# Patient Record
Sex: Female | Born: 1975
Health system: Southern US, Community
[De-identification: ages and names within clinical notes are randomized; demographics above are authoritative.]

## PROBLEM LIST (undated history)

## (undated) DIAGNOSIS — A630 Anogenital (venereal) warts: Secondary | ICD-10-CM

## (undated) DIAGNOSIS — L409 Psoriasis, unspecified: Secondary | ICD-10-CM

## (undated) DIAGNOSIS — K219 Gastro-esophageal reflux disease without esophagitis: Secondary | ICD-10-CM

## (undated) HISTORY — PX: TONSILLECTOMY: SUR1361

## (undated) HISTORY — DX: Gastro-esophageal reflux disease without esophagitis: K21.9

## (undated) HISTORY — PX: NO PAST SURGERIES: SHX2092

## (undated) HISTORY — PX: WISDOM TOOTH EXTRACTION: SHX21

## (undated) HISTORY — DX: Anogenital (venereal) warts: A63.0

---

## 2017-07-14 DIAGNOSIS — J060 Acute laryngopharyngitis: Secondary | ICD-10-CM | POA: Diagnosis not present

## 2017-07-14 DIAGNOSIS — J069 Acute upper respiratory infection, unspecified: Secondary | ICD-10-CM | POA: Diagnosis not present

## 2017-07-14 DIAGNOSIS — J0141 Acute recurrent pansinusitis: Secondary | ICD-10-CM | POA: Diagnosis not present

## 2017-07-26 DIAGNOSIS — R0989 Other specified symptoms and signs involving the circulatory and respiratory systems: Secondary | ICD-10-CM | POA: Diagnosis not present

## 2017-07-26 DIAGNOSIS — F458 Other somatoform disorders: Secondary | ICD-10-CM | POA: Diagnosis not present

## 2017-07-26 DIAGNOSIS — R079 Chest pain, unspecified: Secondary | ICD-10-CM | POA: Diagnosis not present

## 2017-07-26 DIAGNOSIS — B3781 Candidal esophagitis: Secondary | ICD-10-CM | POA: Diagnosis not present

## 2017-07-26 DIAGNOSIS — B370 Candidal stomatitis: Secondary | ICD-10-CM | POA: Diagnosis not present

## 2017-08-12 ENCOUNTER — Emergency Department (HOSPITAL_BASED_OUTPATIENT_CLINIC_OR_DEPARTMENT_OTHER)
Admission: EM | Admit: 2017-08-12 | Discharge: 2017-08-13 | Disposition: A | Discharge status: Home / Self Care | Payer: BLUE CROSS/BLUE SHIELD | Attending: Emergency Medicine | Admitting: Emergency Medicine

## 2017-08-12 ENCOUNTER — Encounter (HOSPITAL_BASED_OUTPATIENT_CLINIC_OR_DEPARTMENT_OTHER): Payer: Self-pay | Admitting: Emergency Medicine

## 2017-08-12 ENCOUNTER — Other Ambulatory Visit: Payer: Self-pay

## 2017-08-12 ENCOUNTER — Emergency Department (HOSPITAL_BASED_OUTPATIENT_CLINIC_OR_DEPARTMENT_OTHER): Payer: BLUE CROSS/BLUE SHIELD

## 2017-08-12 DIAGNOSIS — J069 Acute upper respiratory infection, unspecified: Secondary | ICD-10-CM | POA: Diagnosis not present

## 2017-08-12 DIAGNOSIS — R079 Chest pain, unspecified: Secondary | ICD-10-CM | POA: Diagnosis not present

## 2017-08-12 DIAGNOSIS — R509 Fever, unspecified: Secondary | ICD-10-CM | POA: Diagnosis not present

## 2017-08-12 DIAGNOSIS — J209 Acute bronchitis, unspecified: Secondary | ICD-10-CM | POA: Diagnosis not present

## 2017-08-12 DIAGNOSIS — R000 Tachycardia, unspecified: Secondary | ICD-10-CM | POA: Diagnosis not present

## 2017-08-12 LAB — CBC
HEMATOCRIT: 40.7 % (ref 36.0–46.0)
HEMOGLOBIN: 13.3 g/dL (ref 12.0–15.0)
MCH: 31.8 pg (ref 26.0–34.0)
MCHC: 32.7 g/dL (ref 30.0–36.0)
MCV: 97.4 fL (ref 78.0–100.0)
Platelets: 208 10*3/uL (ref 150–400)
RBC: 4.18 MIL/uL (ref 3.87–5.11)
RDW: 13.1 % (ref 11.5–15.5)
WBC: 6 10*3/uL (ref 4.0–10.5)

## 2017-08-12 LAB — BASIC METABOLIC PANEL
ANION GAP: 10 (ref 5–15)
BUN: 5 mg/dL — ABNORMAL LOW (ref 6–20)
CHLORIDE: 105 mmol/L (ref 101–111)
CO2: 23 mmol/L (ref 22–32)
Calcium: 9.2 mg/dL (ref 8.9–10.3)
Creatinine, Ser: 0.77 mg/dL (ref 0.44–1.00)
GFR calc Af Amer: >60 mL/min (ref >60)
Glucose, Bld: 117 mg/dL — ABNORMAL HIGH (ref 65–99)
POTASSIUM: 3.8 mmol/L (ref 3.5–5.1)
SODIUM: 138 mmol/L (ref 135–145)

## 2017-08-12 LAB — PREGNANCY, URINE: PREG TEST UR: NEGATIVE

## 2017-08-12 LAB — D-DIMER, QUANTITATIVE (NOT AT ARMC): D DIMER QUANT: 0.4 ug{FEU}/mL (ref 0.00–0.50)

## 2017-08-12 LAB — TROPONIN I: Troponin I: <0.03 ng/mL (ref <0.03)

## 2017-08-12 MED ORDER — CETIRIZINE HCL 10 MG PO TABS: 10.0000 mg | ORAL_TABLET | Freq: Every day | ORAL | 0 refills | Status: DC

## 2017-08-12 MED ORDER — SODIUM CHLORIDE 0.9 % IV BOLUS (SEPSIS)
500.0000 mL | Freq: Once | INTRAVENOUS | Status: AC
Start: 2017-08-12 — End: 2017-08-12
  Administered 2017-08-12: 500 mL via INTRAVENOUS

## 2017-08-12 NOTE — ED Triage Notes (Signed)
Patient states that she has had pain to her chest x 1 month. She reports that she has had several therapies for Headaches, cough, sinus infections related to the Chest pain. The patient reports that she went to the Urgent care as was told that her heart rate was so fast that it was about to stop

## 2017-08-12 NOTE — ED Provider Notes (Signed)
[facility] EMERGENCY DEPARTMENT Provider Note   CSN: [id number] Arrival date & time: 08/12/17  1719     History   Chief Complaint Chief Complaint  Patient presents with  . Chest Pain    HPI April Ho is a 42 y.o. female.  The history is provided by the patient. No language interpreter was used.  Chest Pain      April Ho is a 42 y.o. female who presents to the Emergency Department complaining of chest pain.  She reports having 1 month of respiratory symptoms with nasal congestion, sore throat, cough productive of yellow sputum, chest pain, shortness of breath.  The pain in her chest is constant but worse with coughing.  She was told she had a fever today to 101.  No nausea, vomiting, leg swelling or pain.  She has been treated with 2 courses of antibiotics as well as a course of steroids for the symptoms but they continue to worsen.  She has no known medical problems and takes no current medications.  She went to urgent care for the symptoms and they sent her to the emergency department because she was told her heart rate was so high that she would go into cardiac arrest. History reviewed. No pertinent past medical history.  There are no active problems to display for this patient.   History reviewed. No pertinent surgical history.  OB History    No data available       Home Medications    Prior to Admission medications   Medication Sig Start Date End Date Taking? Authorizing Provider  cetirizine (ZYRTEC ALLERGY) 10 MG tablet Take 1 tablet (10 mg total) by mouth daily. 08/12/17   Quintella Reichert, MD    Family History History reviewed. No pertinent family history.  Social History Social History   Tobacco Use  . Smoking status: Never Smoker  . Smokeless tobacco: Never Used  Substance Use Topics  . Alcohol use: No    Frequency: Never  . Drug use: No     Allergies   Patient has no known allergies.   Review of Systems Review of Systems    Cardiovascular: Positive for chest pain.  All other systems reviewed and are negative.    Physical Exam Updated Vital Signs BP 120/84   Pulse 93   Temp 99.8 F (37.7 C) (Oral)   Resp 14   Ht 5\' 7"  (1.702 m)   Wt 85.3 kg (188 lb)   SpO2 99%   BMI 29.44 kg/m   Physical Exam  Constitutional: She is oriented to person, place, and time. She appears well-developed and well-nourished.  HENT:  Head: Normocephalic and atraumatic.  Right Ear: External ear normal.  Left Ear: External ear normal.  Mouth/Throat: Oropharynx is clear and moist. No oropharyngeal exudate.  Eyes: EOM are normal. Pupils are equal, round, and reactive to light.  Neck: Neck supple.  Cardiovascular: Regular rhythm.  No murmur heard. tachycardic  Pulmonary/Chest: Effort normal and breath sounds normal. No respiratory distress.  Abdominal: Soft. There is no tenderness. There is no rebound and no guarding.  Musculoskeletal: She exhibits no edema or tenderness.  Neurological: She is alert and oriented to person, place, and time.  Skin: Skin is warm and dry.  Psychiatric: She has a normal mood and affect. Her behavior is normal.  Nursing note and vitals reviewed.    ED Treatments / Results  Labs (all labs ordered are listed, but only abnormal results are displayed) Labs Reviewed  BASIC  METABOLIC PANEL - Abnormal; Notable for the following components:      Result Value   Glucose, Bld 117 (*)    BUN 5 (*)    All other components within normal limits  CBC  TROPONIN I  D-DIMER, QUANTITATIVE (NOT AT [facility])  PREGNANCY, URINE    EKG  EKG Interpretation  Date/Time:  Saturday August 12 2017 17:28:23 EST Ventricular Rate:  112 PR Interval:  144 QRS Duration: 70 QT Interval:  306 QTC Calculation: 417 R Axis:   88 Text Interpretation:  Sinus tachycardia Nonspecific ST abnormality Abnormal ECG Confirmed by Quintella Reichert [phone]) on 08/12/2017 5:37:18 PM       Radiology Dg Chest 2 View  Result Date:  08/12/2017 CLINICAL DATA:  URI for 1 month, nasal and chest congestion, central chest pain, fever, nausea and vomiting today, lost Colace EXAM: CHEST  2 VIEW COMPARISON:  None FINDINGS: Normal heart size, mediastinal contours, and pulmonary vascularity. Minimal peribronchial thickening. No pulmonary infiltrate, pleural effusion, or pneumothorax. Bones unremarkable. IMPRESSION: Minimal bronchitic changes without infiltrate. Electronically Signed   By: Lavonia Dana M.D.   On: 08/12/2017 18:38    Procedures Procedures (including critical care time)  Medications Ordered in ED Medications  sodium chloride 0.9 % bolus 500 mL (0 mLs Intravenous Stopped 08/12/17 2344)     Initial Impression / Assessment and Plan / ED Course  I have reviewed the triage vital signs and the nursing notes.  Pertinent labs & imaging results that were available during my care of the patient were reviewed by me and considered in my medical decision making (see chart for details).     Patient here for evaluation of chest discomfort, cough that is been present for the last month.  She is nontoxic appearing on examination with no respiratory distress.  Lungs are clear bilaterally.  She has no findings concerning for ACS, PE, dissection, pna, CHF.   Discussed with patient home care for bronchitis.  She is already received medications from urgent care earlier today that she has not started.  She was provided with Tessalon Perles, Afrin.  Will add Zyrtec prescription.  Counseled patient on home care, outpatient follow-up return precautions.  Final Clinical Impressions(s) / ED Diagnoses   Final diagnoses:  Acute bronchitis, unspecified organism    ED Discharge Orders        Ordered    cetirizine (ZYRTEC ALLERGY) 10 MG tablet  Daily     08/12/17 2330       Quintella Reichert, MD 08/13/17 [phone]

## 2017-08-14 ENCOUNTER — Ambulatory Visit: Payer: BLUE CROSS/BLUE SHIELD | Admitting: Family Medicine

## 2017-08-14 ENCOUNTER — Encounter: Payer: Self-pay | Admitting: Family Medicine

## 2017-08-14 VITALS — BP 108/64 | HR 101 | Temp 98.5°F | Ht 67.0 in | Wt 194.4 lb

## 2017-08-14 DIAGNOSIS — B9689 Other specified bacterial agents as the cause of diseases classified elsewhere: Secondary | ICD-10-CM | POA: Diagnosis not present

## 2017-08-14 DIAGNOSIS — J208 Acute bronchitis due to other specified organisms: Secondary | ICD-10-CM | POA: Diagnosis not present

## 2017-08-14 MED ORDER — FLUTICASONE PROPIONATE 50 MCG/ACT NA SUSP: 2.0000 | Freq: Every day | NASAL | 2 refills | Status: DC

## 2017-08-14 MED ORDER — AZITHROMYCIN 250 MG PO TABS: ORAL_TABLET | ORAL | 0 refills | Status: DC

## 2017-08-14 MED ORDER — CETIRIZINE HCL 10 MG PO TABS: 10.0000 mg | ORAL_TABLET | Freq: Every day | ORAL | 0 refills | Status: DC

## 2017-08-14 MED ORDER — BENZONATATE 200 MG PO CAPS: 200.0000 mg | ORAL_CAPSULE | Freq: Three times a day (TID) | ORAL | 0 refills | Status: DC | PRN

## 2017-08-14 NOTE — Patient Instructions (Addendum)
If you get better with the cetirizine over the next 2-3 days, do not take the azithromycin.   Claritin (loratadine), Allegra (fexofenadine), Zyrtec (cetirizine); these are listed in order from weakest to strongest. Generic, and therefore cheaper, options are in the parentheses.   Flonase (fluticasone); nasal spray that is over the counter. 2 sprays each nostril, once daily. Aim towards the same side eye when you spray.  There are available OTC, and the generic versions, which may be cheaper, are in parentheses. Show this to a pharmacist if you have trouble finding any of these items.  Continue to push fluids, practice good hand hygiene, and cover your mouth if you cough.  If you start having fevers, shaking or shortness of breath, seek immediate care.  Let us know if you need anything.

## 2017-08-14 NOTE — Progress Notes (Signed)
Pre visit review using our clinic review tool, if applicable. No additional management support is needed unless otherwise documented below in the visit note. 

## 2017-08-14 NOTE — Progress Notes (Signed)
Chief Complaint  Patient presents with  . Establish Care       New Patient Visit SUBJECTIVE: HPI: April Ho is an 42 y.o.female who is being seen for establishing care.  Duration: 1 month  Associated symptoms: sinus congestion, rhinorrhea, ear pain, sore throat and fevers (100.51F max), cough Denies: sinus pain, itchy watery eyes, ear drainage, shortness of breath, myalgia and rigors Treatment to date: Augmentin, tessalon perles, Hycodan, Prednisone- cough medicine helped Sick contacts: No   No Known Allergies  Past Medical History:  Diagnosis Date  . No known health problems    Past Surgical History:  Procedure Laterality Date  . NO PAST SURGERIES     Social History   Socioeconomic History  . Marital status: Single  Tobacco Use  . Smoking status: Never Smoker  . Smokeless tobacco: Never Used  Substance and Sexual Activity  . Alcohol use: No    Frequency: Never  . Drug use: No   Family History  Problem Relation Age of Onset  . Cancer Neg Hx      Current Outpatient Medications:  .  benzonatate (TESSALON) 200 MG capsule, Take 1 capsule (200 mg total) by mouth 3 (three) times daily as needed for cough., Disp: 30 capsule, Rfl: 0 .  cetirizine (ZYRTEC ALLERGY) 10 MG tablet, Take 1 tablet (10 mg total) by mouth daily., Disp: 30 tablet, Rfl: 0 .  oxymetazoline (ANEFRIN NASAL SPRAY) 0.05 % nasal spray, Place 1 spray into both nostrils 2 (two) times daily., Disp: , Rfl:  .  azithromycin (ZITHROMAX) 250 MG tablet, Take 2 tabs the first day and then 1 tab daily until you run out., Disp: 6 tablet, Rfl: 0 .  fluticasone (FLONASE) 50 MCG/ACT nasal spray, Place 2 sprays into both nostrils daily., Disp: 16 g, Rfl: 2  No LMP recorded. Patient has had an injection.  ROS Cardiovascular: Denies chest pain  Respiratory: Denies dyspnea   OBJECTIVE: BP 108/64 (BP Location: Left Arm, Patient Position: Sitting, Cuff Size: Large)   Pulse (!) 101   Temp 98.5 F (36.9 C) (Oral)    Ht 5\' 7"  (1.702 m)   Wt 194 lb 6 oz (88.2 kg)   SpO2 97%   BMI 30.44 kg/m   Constitutional: -  VS reviewed -  Well developed, well nourished, appears stated age -  No apparent distress  Psychiatric: -  Oriented to person, place, and time -  Memory intact -  Affect and mood normal -  Fluent conversation, good eye contact -  Judgment and insight age appropriate  Eye: -  Conjunctivae clear, no discharge -  Pupils symmetric, round, reactive to light  ENMT: -  MMM    Pharynx moist, no exudate, no erythema -  Turbinates swollen, pale, boggy b/l -  Ears neg b/l  Neck: -  No gross swelling, no palpable masses -  Thyroid midline, not enlarged, mobile, no palpable masses  Cardiovascular: -  RRR -  No LE edema  Respiratory: -  Normal respiratory effort, no accessory muscle use, no retraction -  Breath sounds equal, no wheezes, no ronchi, no crackles  Skin: -  No significant lesion on inspection -  Warm and dry to palpation   ASSESSMENT/PLAN: Acute bacterial bronchitis - Plan: oxymetazoline (ANEFRIN NASAL SPRAY) 0.05 % nasal spray, azithromycin (ZITHROMAX) 250 MG tablet, fluticasone (FLONASE) 50 MCG/ACT nasal spray, benzonatate (TESSALON) 200 MG capsule, cetirizine (ZYRTEC ALLERGY) 10 MG tablet  Refill cough med, zyrtec; pt's last dose of Afrin today. Zpak, wait  a few days to see if she turns the corner with Zyrtec. Patient should return 1 in week if no better. The patient voiced understanding and agreement to the plan.   [place], DO 08/14/17  9:34 AM

## 2017-08-24 ENCOUNTER — Other Ambulatory Visit: Payer: Self-pay | Admitting: Family Medicine

## 2017-08-24 DIAGNOSIS — B9689 Other specified bacterial agents as the cause of diseases classified elsewhere: Secondary | ICD-10-CM

## 2017-08-24 DIAGNOSIS — J208 Acute bronchitis due to other specified organisms: Principal | ICD-10-CM

## 2017-08-24 NOTE — Telephone Encounter (Signed)
If she is not better, she needs to be seen. TY.

## 2017-08-24 NOTE — Telephone Encounter (Signed)
Patient informed of PCP instructions. She has appt. Scheduled tomorrow 08/25/17 with Mackie Pai.

## 2017-08-25 ENCOUNTER — Encounter: Payer: Self-pay | Admitting: Medical

## 2017-08-25 ENCOUNTER — Ambulatory Visit (INDEPENDENT_AMBULATORY_CARE_PROVIDER_SITE_OTHER): Payer: BLUE CROSS/BLUE SHIELD | Admitting: Medical

## 2017-08-25 ENCOUNTER — Telehealth: Payer: Self-pay | Admitting: Family Medicine

## 2017-08-25 VITALS — BP 121/81 | HR 77 | Temp 98.1°F | Resp 16 | Ht 67.0 in | Wt 193.4 lb

## 2017-08-25 DIAGNOSIS — T7840XA Allergy, unspecified, initial encounter: Secondary | ICD-10-CM

## 2017-08-25 DIAGNOSIS — D179 Benign lipomatous neoplasm, unspecified: Secondary | ICD-10-CM | POA: Diagnosis not present

## 2017-08-25 DIAGNOSIS — R109 Unspecified abdominal pain: Secondary | ICD-10-CM

## 2017-08-25 DIAGNOSIS — H938X1 Other specified disorders of right ear: Secondary | ICD-10-CM | POA: Diagnosis not present

## 2017-08-25 DIAGNOSIS — R21 Rash and other nonspecific skin eruption: Secondary | ICD-10-CM

## 2017-08-25 DIAGNOSIS — N912 Amenorrhea, unspecified: Secondary | ICD-10-CM | POA: Diagnosis not present

## 2017-08-25 DIAGNOSIS — R232 Flushing: Secondary | ICD-10-CM | POA: Diagnosis not present

## 2017-08-25 LAB — POC URINALSYSI DIPSTICK (AUTOMATED)
Bilirubin, UA: NEGATIVE
Blood, UA: NEGATIVE
GLUCOSE UA: NEGATIVE
Ketones, UA: NEGATIVE
LEUKOCYTES UA: NEGATIVE
NITRITE UA: NEGATIVE
PROTEIN UA: NEGATIVE
Spec Grav, UA: >=1.03 — AB (ref 1.010–1.025)
UROBILINOGEN UA: NEGATIVE U/dL — AB
pH, UA: 6 (ref 5.0–8.0)

## 2017-08-25 LAB — FOLLICLE STIMULATING HORMONE: FSH: 106.2 m[IU]/mL

## 2017-08-25 NOTE — Telephone Encounter (Signed)
Called in upon leaving the office wondering when they would do the ultrasound of her side.   I let her know she would be contacted to set up a time and date for it to be done.   If she has not heard back to please call us back on Wednesday and we will follow up. She verbalized understanding.

## 2017-08-25 NOTE — Progress Notes (Signed)
Subjective:    Patient ID: April Ho, female    DOB: [birth year], 42 y.o.   MRN: [id number]  HPI  Pt in with rash on both sides of forehead and rt side of neck and upper back for 2 weeks. The rash itches. Pt tried hydrocortisone and it did not help at all. The itching has subsided. Early on it did itch. On review no suspicious exposures. Particular no new soaps creams or detergents.   Pt seen by Dr. Nani Ravens other day. He diagnosed bronchitis. She took azithromycin. Overall she feels a lot better but her rt ear still has faint pressure. She his using flonase but still has the pressure. She still feels nasal congestion.   Pt also has some mild left flank pain. She states had some on last visit. She states pain comes and goes. She states pain will last for a couple of seconds and then subsides. She denies any pain in her lumbar spine or directly in her cva area. Pain transient on and off for 1.5 years.  LMP- she no longer has cycles. Pt states some hot flashes and sweats. Pt states 1.5 years with no cycle.    Review of Systems  Constitutional: Negative for chills, fatigue and fever.  HENT: Positive for congestion and postnasal drip. Negative for hearing loss, sinus pressure, sinus pain, sore throat, trouble swallowing and voice change.        Ear pressure.  Respiratory: Positive for cough. Negative for chest tightness, shortness of breath and wheezing.        Occasional cough still.  Cardiovascular: Negative for chest pain and palpitations.  Gastrointestinal: Negative for abdominal pain, nausea and vomiting.  Musculoskeletal: Negative for back pain, myalgias, neck pain and neck stiffness.  Skin: Positive for rash.  Neurological: Negative for dizziness, weakness, numbness and headaches.  Hematological: Negative for adenopathy. Does not bruise/bleed easily.  Psychiatric/Behavioral: Negative for behavioral problems and confusion. The patient is not nervous/anxious.     Past Medical  History:  Diagnosis Date  . No known health problems      Social History   Socioeconomic History  . Marital status: Single    Spouse name: Not on file  . Number of children: Not on file  . Years of education: Not on file  . Highest education level: Not on file  Social Needs  . Financial resource strain: Not on file  . Food insecurity - worry: Not on file  . Food insecurity - inability: Not on file  . Transportation needs - medical: Not on file  . Transportation needs - non-medical: Not on file  Occupational History  . Not on file  Tobacco Use  . Smoking status: Never Smoker  . Smokeless tobacco: Never Used  Substance and Sexual Activity  . Alcohol use: No    Frequency: Never  . Drug use: No  . Sexual activity: Not on file  Other Topics Concern  . Not on file  Social History Narrative  . Not on file    Past Surgical History:  Procedure Laterality Date  . NO PAST SURGERIES      Family History  Problem Relation Age of Onset  . Cancer Neg Hx     No Known Allergies  Current Outpatient Medications on File Prior to Visit  Medication Sig Dispense Refill  . benzonatate (TESSALON) 200 MG capsule Take 1 capsule (200 mg total) by mouth 3 (three) times daily as needed for cough. 30 capsule 0  . cetirizine (ZYRTEC ALLERGY)  10 MG tablet Take 1 tablet (10 mg total) by mouth daily. 30 tablet 0  . fluticasone (FLONASE) 50 MCG/ACT nasal spray Place 2 sprays into both nostrils daily. 16 g 2  . oxymetazoline (ANEFRIN NASAL SPRAY) 0.05 % nasal spray Place 1 spray into both nostrils 2 (two) times daily.     No current facility-administered medications on file prior to visit.     BP 121/81   Pulse 77   Temp 98.1 F (36.7 C) (Oral)   Resp 16   Ht 5\' 7"  (1.702 m)   Wt 193 lb 6.4 oz (87.7 kg)   SpO2 99%   BMI 30.29 kg/m      Objective:   Physical Exam  General  Mental Status - Alert. General Appearance - Well groomed. Not in acute distress.  Skin Rashes- scattered  rash on boths sides of forehead(passes midline). No vesicles seen. No rash on side of face. Some rash on posterior rt side neck and upper back.  HEENT Head- Normal. Ear Auditory Canal - Left- Normal. Right - Normal.Tympanic Membrane- Left- Normal. Right- Normal. Eye Sclera/Conjunctiva- Left- Normal. Right- Normal. Nose & Sinuses Nasal Mucosa- Left-  Boggy and Congested. Right-  Boggy and  Congested.Bilateral  No maxillary and  No frontal sinus pressure. Mouth & Throat Lips: Upper Lip- Normal: no dryness, cracking, pallor, cyanosis, or vesicular eruption. Lower Lip-Normal: no dryness, cracking, pallor, cyanosis or vesicular eruption. Buccal Mucosa- Bilateral- No Aphthous ulcers. Oropharynx- No Discharge or Erythema. Tonsils: Characteristics- Bilateral- No Erythema or Congestion. Size/Enlargement- Bilateral- No enlargement. Discharge- bilateral-None.  Neck Neck- Supple. No Masses.   Chest and Lung Exam Auscultation: Breath Sounds:-Clear even and unlabored.  Cardiovascular Auscultation:Rythm- Regular, rate and rhythm. Murmurs & Other Heart Sounds:Ausculatation of the heart reveal- No Murmurs.  Lymphatic Head & Neck General Head & Neck Lymphatics: Bilateral: Description- No Localized lymphadenopathy.  Abdomen- soft, nd, nt, +bs, no rebound or guarding. No organomegaly.  lt flank- about 3 inches above iliac crest and below rib. Small possible lipoma.    Assessment & Plan:  For rt ear pressure/eustachian tube dysfunction, I want you to continue flonase and we gave depomedrol.  Your skin rash/possible allergic reaction, possible allergic reaction I think Depo-Medrol will help with this.  You can continue to use the hydrocortisone to your forehead region.  But for your neck rash and upper back region rash, I am prescribing Lotrisone.  Important not to use Lotrisone on your face.  Only hydrocortisone to be used on the face.  For history of no menses for 1.5 years and has flashes, I put  an order for [facility] today.  For your left side flank pain and you report of some feeling small palpable lump, I put in order for ultrasound of that area.  It is possible you have a small lipoma.  Follow-up in 7-10 days or as needed.  After somewhat long discussion about her prior use of prednisone.  She explains no side effect with this.  Just that the last time she used that she did not think it worked.  I discussed with her and explained why I think Depo-Medrol is a good option today and she agreed to the injection.  Koven Belinsky, Percell Miller, PA-C

## 2017-08-25 NOTE — Patient Instructions (Addendum)
For rt ear pressure/eustachian tube dysfunction, I want you to continue flonase and we gave depomedrol.  Your skin rash/possible allergic reaction, possible allergic reaction I think Depo-Medrol will help with this.  You can continue to use the hydrocortisone to your forehead region.  But for your neck rash and upper back region rash, I am prescribing Lotrisone.  Important not to use Lotrisone on your face.  Only hydrocortisone to be used on the face.  For history of no menses for 1.5 years and has flashes, I put an order for [facility] today.  For your left side flank pain and you report of some feeling small palpable lump, I put in order for ultrasound of that area.  It is possible you have a small lipoma.  Follow-up in 7-10 days or as needed.

## 2017-08-26 ENCOUNTER — Ambulatory Visit (HOSPITAL_BASED_OUTPATIENT_CLINIC_OR_DEPARTMENT_OTHER)
Admission: RE | Admit: 2017-08-26 | Discharge: 2017-08-26 | Disposition: A | Discharge status: Home / Self Care | Payer: BLUE CROSS/BLUE SHIELD | Source: Ambulatory Visit | Attending: Medical | Admitting: Medical

## 2017-08-26 DIAGNOSIS — R109 Unspecified abdominal pain: Secondary | ICD-10-CM | POA: Diagnosis not present

## 2017-08-26 DIAGNOSIS — D179 Benign lipomatous neoplasm, unspecified: Secondary | ICD-10-CM | POA: Diagnosis not present

## 2017-08-29 ENCOUNTER — Telehealth: Payer: Self-pay | Admitting: Medical

## 2017-08-29 MED ORDER — DICLOFENAC SODIUM 75 MG PO TBEC: 75.0000 mg | DELAYED_RELEASE_TABLET | Freq: Two times a day (BID) | ORAL | 0 refills | Status: DC

## 2017-08-29 NOTE — Telephone Encounter (Signed)
For her area of pain rt side area between rib and iliac crest.  I did send a prescription of diclofenac to her pharmacy.  Please let her know.

## 2017-08-31 ENCOUNTER — Telehealth: Payer: Self-pay | Admitting: Medical

## 2017-08-31 MED ORDER — CLOTRIMAZOLE-BETAMETHASONE 1-0.05 % EX CREA: 1.0000 "application " | TOPICAL_CREAM | Freq: Two times a day (BID) | CUTANEOUS | 0 refills | Status: DC

## 2017-08-31 NOTE — Telephone Encounter (Signed)
I sent in Lotrisone today.  I am sorry I did not send that sooner.  Notify patient please.

## 2017-08-31 NOTE — Telephone Encounter (Signed)
Notified pt. 

## 2017-08-31 NOTE — Telephone Encounter (Signed)
Sent in Elcho to patient's pharmacy.  Please notify her and apologize for the delay.

## 2017-08-31 NOTE — Telephone Encounter (Signed)
Notified pt medication sent to pharmacy. Pt states she was suppose to get a cream to put on her neck and back that she never got. Please Advise.

## 2017-09-06 ENCOUNTER — Telehealth: Payer: Self-pay | Admitting: Family Medicine

## 2017-09-06 NOTE — Telephone Encounter (Signed)
Copied from [place] [id number]. Topic: General - Other >> Sep 06, 2017 10:23 AM Lolita Rieger, RMA wrote: Reason for CRM: pt would like a call back concerning a shot she received during last visit that she stated that did not work she stated that the rash she had on her face came back and would like a call [phone]

## 2017-09-06 NOTE — Telephone Encounter (Signed)
April Ho has spoke with pt. See phone note 09/06/2017

## 2017-09-06 NOTE — Telephone Encounter (Signed)
Patient was seen on 08/25/17 for an allergic reaction rash. She was injected with Depo-Medrol to help but was unsuccessful. Please advise.

## 2017-09-06 NOTE — Telephone Encounter (Signed)
It has been 2 weeks since I last saw her.  Could you clarify whether or not progress got better than it came back or if it never got better at all?  However since it has been almost 2 weeks I think office visit to evaluate the severity of the rash would be helpful.  At this point considering a dermatology or possibly allergy referral.  But again office visit would be helpful.  I have slot open 1:15 or 1:30 might be convenient for her today.  Or tomorrow morning convenient appointment 8 AM or 8:15.  Encourage her to take early morning or early afternoon appointment as I do not want her to wait if I get real busy/behind.Marland Kitchen

## 2017-09-06 NOTE — Telephone Encounter (Signed)
Copied from [place] [id number]. Topic: General - Other >> Sep 06, 2017 10:23 AM Lolita Rieger, RMA wrote: Reason for CRM: pt would like a call back concerning a shot she received during last visit that she stated that did not work she stated that the rash she had on her face came back and would like a call [phone]   Called the patient to clarify request.  The rash has returned and would like to know what to do?? She is using the prescription called in for her neck and back, but instructions (per PCP)state not to put on face. Advise

## 2017-09-11 ENCOUNTER — Ambulatory Visit: Payer: BLUE CROSS/BLUE SHIELD | Admitting: Family Medicine

## 2017-09-11 ENCOUNTER — Encounter: Payer: Self-pay | Admitting: Family Medicine

## 2017-09-11 VITALS — BP 130/83 | HR 78 | Temp 98.1°F | Resp 16 | Ht 67.0 in | Wt 192.0 lb

## 2017-09-11 DIAGNOSIS — H6981 Other specified disorders of Eustachian tube, right ear: Secondary | ICD-10-CM | POA: Diagnosis not present

## 2017-09-11 DIAGNOSIS — R21 Rash and other nonspecific skin eruption: Secondary | ICD-10-CM | POA: Diagnosis not present

## 2017-09-11 DIAGNOSIS — W57XXXS Bitten or stung by nonvenomous insect and other nonvenomous arthropods, sequela: Secondary | ICD-10-CM

## 2017-09-11 MED ORDER — METHYLPREDNISOLONE 4 MG PO TBPK: ORAL_TABLET | ORAL | 0 refills | Status: DC

## 2017-09-11 MED ORDER — NAPROXEN 500 MG PO TBEC: 500.0000 mg | DELAYED_RELEASE_TABLET | Freq: Two times a day (BID) | ORAL | 0 refills | Status: DC

## 2017-09-11 MED ORDER — KETOCONAZOLE 2 % EX CREA: 1.0000 "application " | TOPICAL_CREAM | Freq: Every day | CUTANEOUS | 0 refills | Status: AC

## 2017-09-11 MED ORDER — LEVOCETIRIZINE DIHYDROCHLORIDE 5 MG PO TABS: 5.0000 mg | ORAL_TABLET | Freq: Every evening | ORAL | 1 refills | Status: DC

## 2017-09-11 NOTE — Patient Instructions (Addendum)
Put your sheets and clothes through a dryer cycle.  Don't take naproxen while on the steroid (methylprednisolone). Send me a MyChart message in 1 week if ear is not better. Continue the nasal spray.  If your skin on your forehead is doing better, cancel the appointment.  Let us know if you need anything.

## 2017-09-11 NOTE — Progress Notes (Signed)
Chief Complaint  Patient presents with  . Rash    Complains of skin rash on throughout the body. This rash is itchy, first noticed about a year ago.   . Ear Fullness    Complains of rt ear fullness,     April Ho is a 42 y.o. female here for a skin complaint.  Duration: 3 weeks; she recently moved into a new home. Location: arms, legs, torso, face Pruritic? Yes Painful? No Drainage? No New soaps/lotions/topicals/detergents? No Sick contacts? No Other associated symptoms: Feels like she is getting bitten Therapies tried thus far: Lotrisone  Rash on face as well. Scaly, slightly itchy.  Has not tried anything so far.  Her right ear is still popping and feeling full.  There is no pain or drainage.  She was given a shot of Depo-Medrol a couple weeks ago.  She has been using an intranasal corticosteroid.  Nothing is provided relief.  Denies fevers.  ROS:  Const: No fevers Skin: As noted in HPI  Past Medical History:  Diagnosis Date  . No known health problems    No Known Allergies Allergies as of 09/11/2017   No Known Allergies     Medication List        Accurate as of 09/11/17  4:55 PM. Always use your most recent med list.          ANEFRIN NASAL SPRAY 0.05 % nasal spray Generic drug:  oxymetazoline Place 1 spray into both nostrils 2 (two) times daily.   fluticasone 50 MCG/ACT nasal spray Commonly known as:  FLONASE Place 2 sprays into both nostrils daily.   ketoconazole 2 % cream Commonly known as:  NIZORAL Apply 1 application topically daily for 14 days. Apply to areas on face.   levocetirizine 5 MG tablet Commonly known as:  XYZAL Take 1 tablet (5 mg total) by mouth every evening.   methylPREDNISolone 4 MG Tbpk tablet Commonly known as:  MEDROL DOSEPAK Follow instructions on package.   naproxen 500 MG EC tablet Commonly known as:  EC NAPROSYN Take 1 tablet (500 mg total) by mouth 2 (two) times daily with a meal.       BP 130/83 (BP Location: Right  Arm, Patient Position: Sitting, Cuff Size: Small)   Pulse 78   Temp 98.1 F (36.7 C) (Oral)   Resp 16   Ht 5\' 7"  (1.702 m)   Wt 192 lb (87.1 kg)   SpO2 97%   BMI 30.07 kg/m  Gen: awake, alert, appearing stated age Ears: Neg b/l Lungs: No accessory muscle use Skin: On her torso, neck and upper extremities, flesh-colored areas of excoriation, circular, no erythema, fluctuance, TTP.  On the forehead, there are scaly macules, some confluent, without erythema, tenderness, drainage, or fluctuance Psych: Age appropriate judgment and insight  Dysfunction of right eustachian tube - Plan: methylPREDNISolone (MEDROL DOSEPAK) 4 MG TBPK tablet  Bug bite, sequela - Plan: levocetirizine (XYZAL) 5 MG tablet  Rash - Plan: ketoconazole (NIZORAL) 2 % cream  Orders as above.  Medrol Dosepak as she has tolerated prednisone poorly in the past.  Let us know in 1 week if not better, will refer to ENT. It does appear to be bedbugs.  Symptomatic care.  Need to call exterminator and/or landlord.  Put sheets and clothing through the dryer cycle. Facial lesion appears to be tinea, will call in ketoconazole cream for 2 weeks. F/u 4 weeks if no improvement with a forehead rash.  Will consider biopsy versus referral. The  patient voiced understanding and agreement to the plan.  [place], DO 09/11/17 4:55 PM

## 2017-10-27 ENCOUNTER — Other Ambulatory Visit: Payer: Self-pay | Admitting: Family Medicine

## 2017-10-27 NOTE — Telephone Encounter (Signed)
Copied from [place] [id number]. Topic: Quick Communication - Rx Refill/Question >> Oct 27, 2017  4:33 PM Oliver Pila B wrote: Pt called b/c she is constipated all the time and the OTC medicines are not working  Pt also needs a refill for levocetirizine (XYZAL) 5 MG tablet [[id number]]  [place]

## 2017-10-27 NOTE — Telephone Encounter (Addendum)
Left message for pt to return call to the office to discuss current symptoms and to see if pt had contacted pharmacy regarding medication refill of Xyzal.

## 2017-10-30 ENCOUNTER — Other Ambulatory Visit: Payer: Self-pay | Admitting: Medical

## 2017-11-29 ENCOUNTER — Other Ambulatory Visit: Payer: Self-pay | Admitting: Family Medicine

## 2017-11-29 DIAGNOSIS — W57XXXS Bitten or stung by nonvenomous insect and other nonvenomous arthropods, sequela: Secondary | ICD-10-CM

## 2018-01-05 ENCOUNTER — Other Ambulatory Visit: Payer: Self-pay | Admitting: Family Medicine

## 2018-01-05 DIAGNOSIS — W57XXXS Bitten or stung by nonvenomous insect and other nonvenomous arthropods, sequela: Secondary | ICD-10-CM

## 2018-02-01 ENCOUNTER — Other Ambulatory Visit: Payer: Self-pay | Admitting: Family Medicine

## 2018-02-01 DIAGNOSIS — W57XXXS Bitten or stung by nonvenomous insect and other nonvenomous arthropods, sequela: Secondary | ICD-10-CM

## 2018-03-02 ENCOUNTER — Other Ambulatory Visit: Payer: Self-pay | Admitting: Family Medicine

## 2018-03-02 DIAGNOSIS — W57XXXS Bitten or stung by nonvenomous insect and other nonvenomous arthropods, sequela: Secondary | ICD-10-CM

## 2018-03-10 ENCOUNTER — Other Ambulatory Visit: Payer: Self-pay | Admitting: Family Medicine

## 2018-03-10 DIAGNOSIS — R109 Unspecified abdominal pain: Secondary | ICD-10-CM

## 2018-03-22 ENCOUNTER — Encounter: Payer: Self-pay | Admitting: Family Medicine

## 2018-03-22 ENCOUNTER — Ambulatory Visit (INDEPENDENT_AMBULATORY_CARE_PROVIDER_SITE_OTHER): Payer: BLUE CROSS/BLUE SHIELD | Admitting: Family Medicine

## 2018-03-22 VITALS — BP 108/70 | HR 89 | Temp 98.7°F | Ht 67.0 in | Wt 190.1 lb

## 2018-03-22 DIAGNOSIS — R109 Unspecified abdominal pain: Secondary | ICD-10-CM

## 2018-03-22 DIAGNOSIS — A630 Anogenital (venereal) warts: Secondary | ICD-10-CM | POA: Diagnosis not present

## 2018-03-22 MED ORDER — IMIQUIMOD 5 % EX CREA: TOPICAL_CREAM | CUTANEOUS | 5 refills | Status: DC

## 2018-03-22 NOTE — Patient Instructions (Addendum)
Stretch your sides daily. Hold for 30 seconds.   Let us know if you need anything.

## 2018-03-22 NOTE — Progress Notes (Signed)
Chief Complaint  Patient presents with  . bump on vaginal area    April Ho is a 42 y.o. female here for a skin complaint.  She has a history of genital warts for which she is topical Aldara for.  It works well.  She uses it 3 times weekly.  She has no adverse effects from the medicine.  She has never received a refill from [place] as she established earlier in the year.  No changes.  ROS:  Const: No fevers Skin: As noted in HPI  Past Medical History:  Diagnosis Date  . Genital warts    No Known Allergies   BP 108/70 (BP Location: Left Arm, Patient Position: Sitting, Cuff Size: Normal)   Pulse 89   Temp 98.7 F (37.1 C) (Oral)   Ht 5\' 7"  (1.702 m)   Wt 190 lb 2 oz (86.2 kg)   SpO2 97%   BMI 29.78 kg/m  Gen: awake, alert, appearing stated age Lungs: No accessory muscle use GU: Declined MSK: +TTP over L obliques and rib cage Psych: Age appropriate judgment and insight  Genital warts - Plan: imiquimod (ALDARA) 5 % cream  Side pain  Continue cream.  If no improvement/if further any changes, she will need to return and I will perform an examination. For the side pain, stretch, heat, ice, anti-inflammatories, Tylenol as needed.  This is likely musculoskeletal given the negative work-up done so far. F/u for physical in January. The patient voiced understanding and agreement to the plan.  [place], DO 03/22/18 4:42 PM

## 2018-03-22 NOTE — Progress Notes (Signed)
Pre visit review using our clinic review tool, if applicable. No additional management support is needed unless otherwise documented below in the visit note. 

## 2018-03-23 ENCOUNTER — Telehealth: Payer: Self-pay | Admitting: Family Medicine

## 2018-03-23 NOTE — Telephone Encounter (Signed)
Copied from [place] [id number]. Topic: Quick Communication - Rx Refill/Question >> Mar 23, 2018  3:47 PM Margot Ables wrote: Medication: April Ho - pt states that pharmacy told her PA is required - if this PA hasn't approved is the an alternate medication (this one is $1000 if not covered by insurance). Please advise. Has the patient contacted their pharmacy?yes Preferred Pharmacy (with phone number or street name): [id number], [place] [phone] (Phone) [phone] (Fax)

## 2018-03-23 NOTE — Telephone Encounter (Signed)
Let's do PA as this is what she was on before. TY.

## 2018-03-26 NOTE — Telephone Encounter (Signed)
PA initiated via Covermymeds; KEY: [id number]. Received real time PA approval. Effective from 03/26/2018 through 07/15/2018.

## 2018-04-04 ENCOUNTER — Telehealth: Payer: Self-pay | Admitting: Family Medicine

## 2018-04-04 NOTE — Telephone Encounter (Signed)
Copied from [place] [id number]. Topic: Quick Communication - Rx Refill/Question >> Mar 23, 2018  3:47 PM Margot Ables wrote: Medication: [place]oy [place] - pt states that pharmacy told her PA is required - if this PA hasn't approved is the an alternate medication (this one is $1000 if not covered by insurance). Please advise. Has the patient contacted their pharmacy?yes Preferred Pharmacy (with phone number or street name): [facility], [place] [phone] (Phone) [phone] (Fax) >> Apr 04, 2018  5:03 PM Percell Belt A wrote: Pt called in and stated that she needs the brand name of the imiquimod (ALDARA) 5 % cream   Pt stated that ins will only cover the 12 each not the 24.  She stated the brand name is the only one that works for her.  [place]  Call back number  [phone]

## 2018-04-05 NOTE — Telephone Encounter (Signed)
Pharmacy informed///patient informed

## 2018-04-05 NOTE — Telephone Encounter (Signed)
OK 

## 2018-04-05 NOTE — Telephone Encounter (Signed)
Advise on this request

## 2018-04-11 ENCOUNTER — Other Ambulatory Visit: Payer: Self-pay | Admitting: Family Medicine

## 2018-04-11 DIAGNOSIS — W57XXXS Bitten or stung by nonvenomous insect and other nonvenomous arthropods, sequela: Secondary | ICD-10-CM

## 2018-04-13 ENCOUNTER — Other Ambulatory Visit: Payer: Self-pay | Admitting: Family Medicine

## 2018-05-31 ENCOUNTER — Encounter: Payer: Self-pay | Admitting: Family Medicine

## 2018-05-31 ENCOUNTER — Ambulatory Visit: Payer: BLUE CROSS/BLUE SHIELD | Admitting: Family Medicine

## 2018-05-31 VITALS — BP 108/80 | HR 75 | Temp 97.8°F | Ht 66.0 in | Wt 195.0 lb

## 2018-05-31 DIAGNOSIS — A630 Anogenital (venereal) warts: Secondary | ICD-10-CM | POA: Diagnosis not present

## 2018-05-31 DIAGNOSIS — Z23 Encounter for immunization: Secondary | ICD-10-CM | POA: Diagnosis not present

## 2018-05-31 DIAGNOSIS — Z30018 Encounter for initial prescription of other contraceptives: Secondary | ICD-10-CM | POA: Diagnosis not present

## 2018-05-31 LAB — POCT URINE PREGNANCY: Preg Test, Ur: NEGATIVE

## 2018-05-31 MED ORDER — IMIQUIMOD 5 % EX CREA: TOPICAL_CREAM | CUTANEOUS | 0 refills | Status: DC

## 2018-05-31 NOTE — Patient Instructions (Addendum)
No unprotected sexual activity for the next 2 weeks.   The areas on your legs are not warts, but skin tags.  Let us know if you need anything.

## 2018-05-31 NOTE — Progress Notes (Signed)
Chief Complaint  Patient presents with  . Medication Problem    Aldara cream and discuss BC    April Ho is a 42 y.o. female here for f/u genital warts.  Was successfully tx'd with brand Aldara in past, ins only covering generic and it is not working. Interested in other options. Has never had cryotherapy or any other tx/procedures  ROS:  Const: No fevers Skin: As noted in HPI  Past Medical History:  Diagnosis Date  . Genital warts     BP 108/80 (BP Location: Left Arm, Patient Position: Sitting, Cuff Size: Normal)   Pulse 75   Temp 97.8 F (36.6 C) (Oral)   Ht 5\' 6"  (1.676 m)   Wt 195 lb (88.5 kg)   SpO2 97%   BMI 31.47 kg/m  Gen: awake, alert, appearing stated age Lungs: No accessory muscle use Skin: circular and raised lesion midline of mons pubis. No drainage, erythema, TTP, fluctuance, excoriation Psych: Age appropriate judgment and insight, flat affect  Procedure note: cryotherapy Verbal consent obtained 1 skin lesion treated Liquid nitrogen was applied via a thin spray creating an ice ball with 1-2 mm corona surrounding the lesion The patient tolerated the procedure well There were no immediate complications noted   Genital warts - Plan: imiquimod (ALDARA) 5 % cream, PR DESTRUCTION BENIGN LESIONS UP TO 14  Encounter for initial prescription of other contraceptives - Plan: POCT urine pregnancy  Need for HPV vaccination - Plan: HPV 9-valent vaccine,Recombinat   We will try brand-name Aldara.  Cryotherapy today.  The other lesions she is questioning are actually acrochordons. Check urine pregnancy today, will repeat in 2 weeks and if negative, will start double shots. First of 3 HPV vaccinations. F/u in 1 week to refreeze lesion.  We will repeat again 1 week following that.  If no improvement, will refer to GYN versus shave biopsy. The patient voiced understanding and agreement to the plan.  [place], DO 05/31/18 9:16 AM

## 2018-05-31 NOTE — Progress Notes (Signed)
Pre visit review using our clinic review tool, if applicable. No additional management support is needed unless otherwise documented below in the visit note. 

## 2018-06-07 ENCOUNTER — Encounter: Payer: Self-pay | Admitting: Family Medicine

## 2018-06-07 ENCOUNTER — Ambulatory Visit: Payer: BLUE CROSS/BLUE SHIELD | Admitting: Family Medicine

## 2018-06-07 VITALS — BP 108/70 | HR 81 | Temp 98.6°F | Ht 66.0 in | Wt 194.5 lb

## 2018-06-07 DIAGNOSIS — K219 Gastro-esophageal reflux disease without esophagitis: Secondary | ICD-10-CM | POA: Diagnosis not present

## 2018-06-07 DIAGNOSIS — A630 Anogenital (venereal) warts: Secondary | ICD-10-CM

## 2018-06-07 DIAGNOSIS — Z793 Long term (current) use of hormonal contraceptives: Secondary | ICD-10-CM

## 2018-06-07 MED ORDER — ESOMEPRAZOLE MAGNESIUM 20 MG PO CPDR: 20.0000 mg | DELAYED_RELEASE_CAPSULE | Freq: Two times a day (BID) | ORAL | 3 refills | Status: DC

## 2018-06-07 MED ORDER — IMIQUIMOD 5 % EX CREA: TOPICAL_CREAM | CUTANEOUS | 0 refills | Status: DC

## 2018-06-07 MED ORDER — MEDROXYPROGESTERONE ACETATE 150 MG/ML IM SUSP
150.0000 mg | Freq: Once | INTRAMUSCULAR | Status: AC
  Administered 2018-06-07: 150 mg via INTRAMUSCULAR

## 2018-06-07 NOTE — Progress Notes (Signed)
Chief Complaint  Patient presents with  . Genital Warts    recheck    Subjective: Patient is a 42 y.o. female here for gen wart reck.  Tried to call in brand Aldara but insurance would not pay. Sent in brand name last time, but it did not work. Here for 2 of 3rd tx.   Hx of reflux. Would like Nexium called in, uses prn.   ROS: Skin: +warts  Past Medical History:  Diagnosis Date  . Genital warts     Objective: BP 108/70 (BP Location: Left Arm, Patient Position: Sitting, Cuff Size: Normal)   Pulse 81   Temp 98.6 F (37 C) (Oral)   Ht 5\' 6"  (1.676 m)   Wt 194 lb 8 oz (88.2 kg)   SpO2 97%   BMI 31.39 kg/m  General: Awake, appears stated age Skin: Area of interest in mons is significantly smaller compared to last week. Looks like it has blistered and been unroofed. There are smaller lesions over medial prox areas of thighs b/l (4 total). Lungs: No accessory muscle use Psych: Age appropriate judgment and insight, normal affect and mood  Procedure note: cryotherapy Verbal consent obtained 4 skin lesions treated Liquid nitrogen was applied via a thin spray creating an ice ball with 1-2 mm corona surrounding the lesion The patient tolerated the procedure well There were no immediate complications noted   Assessment and Plan: Genital warts - Plan: imiquimod (ALDARA) 5 % cream, PR DESTRUCTION BENIGN LESIONS UP TO 14  Gastroesophageal reflux disease, esophagitis presence not specified - Plan: esomeprazole (NEXIUM) 20 MG capsule  Orders as above. Will try to send again. Did not retreat the initial lesion, but the satellite lesions on thighs. TAO for the initial lesion, will recheck in 1 week. Will try to fill Nexium, unsure how much insurance will cover. F/u in 1 week.  The patient voiced understanding and agreement to the plan.  [place], DO 06/07/18  3:11 PM

## 2018-06-07 NOTE — Progress Notes (Signed)
Pre visit review using our clinic review tool, if applicable. No additional management support is needed unless otherwise documented below in the visit note. 

## 2018-06-07 NOTE — Patient Instructions (Addendum)
See if your insurance will cover the prescription Nexium.  I expect the area to blister and drain.  Neosporin twice daily for the next 7 days.   Let us know if you need anything.

## 2018-06-18 ENCOUNTER — Ambulatory Visit: Payer: BLUE CROSS/BLUE SHIELD | Admitting: Medical

## 2018-06-18 ENCOUNTER — Telehealth: Payer: Self-pay | Admitting: Medical

## 2018-06-18 ENCOUNTER — Encounter: Payer: Self-pay | Admitting: Medical

## 2018-06-18 VITALS — BP 120/78 | HR 81 | Temp 98.2°F | Resp 16 | Ht 66.0 in | Wt 197.2 lb

## 2018-06-18 DIAGNOSIS — A630 Anogenital (venereal) warts: Secondary | ICD-10-CM | POA: Diagnosis not present

## 2018-06-18 MED ORDER — MUPIROCIN 2 % EX OINT: TOPICAL_OINTMENT | CUTANEOUS | 0 refills | Status: DC

## 2018-06-18 MED ORDER — PODOFILOX 0.5 % EX GEL: Freq: Two times a day (BID) | CUTANEOUS | 0 refills | Status: DC

## 2018-06-18 NOTE — Telephone Encounter (Signed)
Will you cal patient and give her the gyn office number. Let her know they said they would call her but she can go ahead and also make call. Also would you ask her to update [place] on when her appointment date is.

## 2018-06-18 NOTE — Progress Notes (Addendum)
Subjective:    Patient ID: April Ho, female    DOB: 01-03-1976, 42 y.o.   MRN: [id number]  HPI   Pt in for follow up.  She states 2 weeks ago. Some burning in her upper vaginal irritation. Pt had area treated by described cryofreeze on May 31 2018. Marland Kitchen Pt the area is quite sore. She decribes area near clitoris but on exam as discussed below the area is in mid suprapubic region.  Pt did get some aldara ointment. But states ran out and she can't get that refilled. She states told by pharmacy to soon.  She states the area feels blistered.   Original note states if not improving then will refer to gyn for shave biopsy.  Pt states when wipes not getting any white discharge.Not reporting any discharge from prior areas treated by Dr. Rosita Kea. No yellow discharge.  She states no vaginal discharge.   Review of Systems  Constitutional: Negative for chills, fatigue and fever.  Respiratory: Negative for chest tightness, shortness of breath and wheezing.   Cardiovascular: Negative for chest pain and palpitations.  Gastrointestinal: Negative for abdominal pain.  Genitourinary: Positive for genital sores. Negative for decreased urine volume, dyspareunia, dysuria, pelvic pain, urgency, vaginal bleeding and vaginal pain.  Musculoskeletal: Negative for back pain and myalgias.  Skin: Negative for rash.  Neurological: Negative for dizziness, weakness and light-headedness.  Hematological: Negative for adenopathy. Does not bruise/bleed easily.  Psychiatric/Behavioral: Negative for behavioral problems and confusion.    Past Medical History:  Diagnosis Date  . Genital warts      Social History   Socioeconomic History  . Marital status: Single    Spouse name: Not on file  . Number of children: Not on file  . Years of education: Not on file  . Highest education level: Not on file  Occupational History  . Not on file  Social Needs  . Financial resource strain: Not on file  . Food  insecurity:    Worry: Not on file    Inability: Not on file  . Transportation needs:    Medical: Not on file    Non-medical: Not on file  Tobacco Use  . Smoking status: Never Smoker  . Smokeless tobacco: Never Used  Substance and Sexual Activity  . Alcohol use: No    Frequency: Never  . Drug use: No  . Sexual activity: Not on file  Lifestyle  . Physical activity:    Days per week: Not on file    Minutes per session: Not on file  . Stress: Not on file  Relationships  . Social connections:    Talks on phone: Not on file    Gets together: Not on file    Attends religious service: Not on file    Active member of club or organization: Not on file    Attends meetings of clubs or organizations: Not on file    Relationship status: Not on file  . Intimate partner violence:    Fear of current or ex partner: Not on file    Emotionally abused: Not on file    Physically abused: Not on file    Forced sexual activity: Not on file  Other Topics Concern  . Not on file  Social History Narrative  . Not on file    Past Surgical History:  Procedure Laterality Date  . NO PAST SURGERIES      Family History  Problem Relation Age of Onset  . Cancer Neg Hx  No Known Allergies  Current Outpatient Medications on File Prior to Visit  Medication Sig Dispense Refill  . clotrimazole-betamethasone (LOTRISONE) cream APPLY EXTERNALLY TO THE AFFECTED AREA TWICE DAILY 30 g 0  . esomeprazole (NEXIUM) 20 MG capsule Take 1 capsule (20 mg total) by mouth 2 (two) times daily before a meal. 60 capsule 3  . fluticasone (FLONASE) 50 MCG/ACT nasal spray Place 2 sprays into both nostrils daily. 16 g 2  . imiquimod (ALDARA) 5 % cream Apply topically 3 (three) times a week. 12 each 0  . levocetirizine (XYZAL) 5 MG tablet TAKE 1 TABLET(5 MG) BY MOUTH EVERY EVENING 30 tablet 0  . NAPROXEN DR 500 MG EC tablet TAKE 1 TABLET(500 MG) BY MOUTH TWICE DAILY WITH A MEAL 60 tablet 0  . oxymetazoline (ANEFRIN NASAL  SPRAY) 0.05 % nasal spray Place 1 spray into both nostrils 2 (two) times daily.     No current facility-administered medications on file prior to visit.     BP 120/78   Pulse 81   Temp 98.2 F (36.8 C) (Oral)   Resp 16   Ht 5\' 6"  (1.676 m)   Wt 197 lb 3.2 oz (89.4 kg)   SpO2 100%   BMI 31.83 kg/m       Objective:   Physical Exam  General- No acute distress. Pleasant patient.  Lungs- Clear, even and unlabored. Heart- regular rate and rhythm.  Genital exam- circular and raised hypopigmented area on both medial thighs and one in mid suprapubic area. No vaginal discharge. On inspection no lesion near clitoral. Only inspected area. Exam done with [facility] as chaperone.       Assessment & Plan:  [phone].   You do appear to have new lesion in the suprapubic area(other area noted on prior notes).  The other lesions are still persisting as well.  I did discuss your recent treatments with Dr. Nani Ravens your PCP and he did advise referring you to a gynecologist.  I did go ahead and prescribe Condylox as you did request new medication and no longer have Aldara.  Do not use both.  Also no application of Condylox to the mucosal areas or near the clitoris.  I went ahead and made referral to gynecologist.  I am asking when to get you in later this week or early next week.  Follow up as needed  Note did review case with Dr. Nani Ravens as I was unsure what recent baseline appearance was prior to treatment and wanted his advise.  Mackie Pai, PA-C   I did prescribe mupirocin antibiotic in place of neosporin. She can apply than on previously treated area by Dr. Nani Ravens.  Kim did inititiate referral to gyn. They said they would call pt. Will also send kim message asking her to call patient. Give pt gyn number so she can try to call them as well. Also ask Maudie Mercury to ask pt to call and update me on appointment date.

## 2018-06-18 NOTE — Patient Instructions (Addendum)
You do appear to have new lesion in the suprapubic area(other area noted on prior notes).  The other lesions are still persisting as well.  I did discuss your recent treatments with Dr. Nani Ravens your PCP and he did advise referring you to a gynecologist.  I did go ahead and prescribe Condylox as you did request new medication and no longer have Aldara.  Do not use both.  Also no application of Condylox to the mucosal areas or near the clitoris.  I went ahead and made referral to gynecologist.  I am asking when to get you in later this week or early next week.  Follow up as needed

## 2018-06-21 ENCOUNTER — Encounter: Payer: BLUE CROSS/BLUE SHIELD | Admitting: Family Medicine

## 2018-06-21 NOTE — Telephone Encounter (Signed)
She is scheduled for today. The office called her & scheduled her accordingly.

## 2018-06-24 ENCOUNTER — Other Ambulatory Visit: Payer: Self-pay

## 2018-06-24 ENCOUNTER — Encounter (HOSPITAL_BASED_OUTPATIENT_CLINIC_OR_DEPARTMENT_OTHER): Payer: Self-pay | Admitting: Emergency Medicine

## 2018-06-24 ENCOUNTER — Emergency Department (HOSPITAL_BASED_OUTPATIENT_CLINIC_OR_DEPARTMENT_OTHER)
Admission: EM | Admit: 2018-06-24 | Discharge: 2018-06-24 | Disposition: A | Discharge status: Home / Self Care | Payer: BLUE CROSS/BLUE SHIELD | Attending: Emergency Medicine | Admitting: Emergency Medicine

## 2018-06-24 ENCOUNTER — Emergency Department (HOSPITAL_BASED_OUTPATIENT_CLINIC_OR_DEPARTMENT_OTHER): Payer: BLUE CROSS/BLUE SHIELD

## 2018-06-24 DIAGNOSIS — R112 Nausea with vomiting, unspecified: Secondary | ICD-10-CM | POA: Diagnosis not present

## 2018-06-24 DIAGNOSIS — R1031 Right lower quadrant pain: Secondary | ICD-10-CM | POA: Diagnosis not present

## 2018-06-24 DIAGNOSIS — N132 Hydronephrosis with renal and ureteral calculous obstruction: Secondary | ICD-10-CM | POA: Diagnosis not present

## 2018-06-24 DIAGNOSIS — Z79899 Other long term (current) drug therapy: Secondary | ICD-10-CM | POA: Insufficient documentation

## 2018-06-24 LAB — WET PREP, GENITAL
CLUE CELLS WET PREP: NONE SEEN
SPERM: NONE SEEN
TRICH WET PREP: NONE SEEN
Yeast Wet Prep HPF POC: NONE SEEN

## 2018-06-24 LAB — CBC WITH DIFFERENTIAL/PLATELET
ABS IMMATURE GRANULOCYTES: 0.02 10*3/uL (ref 0.00–0.07)
Basophils Absolute: 0 10*3/uL (ref 0.0–0.1)
Basophils Relative: 0 %
Eosinophils Absolute: 0 10*3/uL (ref 0.0–0.5)
Eosinophils Relative: 0 %
HEMATOCRIT: 42.9 % (ref 36.0–46.0)
HEMOGLOBIN: 13.7 g/dL (ref 12.0–15.0)
Immature Granulocytes: 0 %
LYMPHS ABS: 0.8 10*3/uL (ref 0.7–4.0)
LYMPHS PCT: 9 %
MCH: 31.4 pg (ref 26.0–34.0)
MCHC: 31.9 g/dL (ref 30.0–36.0)
MCV: 98.4 fL (ref 80.0–100.0)
Monocytes Absolute: 0.3 10*3/uL (ref 0.1–1.0)
Monocytes Relative: 3 %
NEUTROS ABS: 7.3 10*3/uL (ref 1.7–7.7)
NRBC: 0 % (ref 0.0–0.2)
Neutrophils Relative %: 88 %
Platelets: 291 10*3/uL (ref 150–400)
RBC: 4.36 MIL/uL (ref 3.87–5.11)
RDW: 12.8 % (ref 11.5–15.5)
WBC: 8.4 10*3/uL (ref 4.0–10.5)

## 2018-06-24 LAB — COMPREHENSIVE METABOLIC PANEL
ALBUMIN: 4.7 g/dL (ref 3.5–5.0)
ALK PHOS: 99 U/L (ref 38–126)
ALT: 15 U/L (ref 0–44)
ANION GAP: 12 (ref 5–15)
AST: 16 U/L (ref 15–41)
BUN: 14 mg/dL (ref 6–20)
CALCIUM: 9.6 mg/dL (ref 8.9–10.3)
CHLORIDE: 108 mmol/L (ref 98–111)
CO2: 22 mmol/L (ref 22–32)
CREATININE: 0.84 mg/dL (ref 0.44–1.00)
GFR calc non Af Amer: >60 mL/min (ref >60)
GLUCOSE: 158 mg/dL — AB (ref 70–99)
Potassium: 3.6 mmol/L (ref 3.5–5.1)
SODIUM: 142 mmol/L (ref 135–145)
Total Bilirubin: 0.3 mg/dL (ref 0.3–1.2)
Total Protein: 8.2 g/dL — ABNORMAL HIGH (ref 6.5–8.1)

## 2018-06-24 LAB — URINALYSIS, MICROSCOPIC (REFLEX): RBC / HPF: NONE SEEN RBC/hpf (ref 0–5)

## 2018-06-24 LAB — URINALYSIS, ROUTINE W REFLEX MICROSCOPIC
BILIRUBIN URINE: NEGATIVE
Glucose, UA: NEGATIVE mg/dL
Hgb urine dipstick: NEGATIVE
Ketones, ur: 15 mg/dL — AB
LEUKOCYTES UA: NEGATIVE
NITRITE: NEGATIVE
Protein, ur: 30 mg/dL — AB
Specific Gravity, Urine: >1.03 — ABNORMAL HIGH (ref 1.005–1.030)
pH: 6.5 (ref 5.0–8.0)

## 2018-06-24 LAB — PREGNANCY, URINE: PREG TEST UR: NEGATIVE

## 2018-06-24 LAB — LIPASE, BLOOD: Lipase: 22 U/L (ref 11–51)

## 2018-06-24 MED ORDER — HYDROMORPHONE HCL 1 MG/ML IJ SOLN
0.5000 mg | INTRAMUSCULAR | Status: DC | PRN
  Administered 2018-06-24: 0.5 mg via INTRAVENOUS
  Filled 2018-06-24: qty 1

## 2018-06-24 MED ORDER — IOPAMIDOL (ISOVUE-300) INJECTION 61%
100.0000 mL | Freq: Once | INTRAVENOUS | Status: AC | PRN
  Administered 2018-06-24: 100 mL via INTRAVENOUS

## 2018-06-24 MED ORDER — OXYCODONE-ACETAMINOPHEN 5-325 MG PO TABS: 1.0000 | ORAL_TABLET | Freq: Four times a day (QID) | ORAL | 0 refills | Status: DC | PRN

## 2018-06-24 MED ORDER — MORPHINE SULFATE (PF) 4 MG/ML IV SOLN
4.0000 mg | Freq: Once | INTRAVENOUS | Status: AC
  Administered 2018-06-24: 4 mg via INTRAVENOUS
  Filled 2018-06-24: qty 1

## 2018-06-24 MED ORDER — ONDANSETRON HCL 4 MG/2ML IJ SOLN
4.0000 mg | Freq: Once | INTRAMUSCULAR | Status: AC
  Administered 2018-06-24: 4 mg via INTRAVENOUS
  Filled 2018-06-24: qty 2

## 2018-06-24 MED ORDER — OXYCODONE-ACETAMINOPHEN 5-325 MG PO TABS
1.0000 | ORAL_TABLET | Freq: Once | ORAL | Status: AC
  Administered 2018-06-24: 1 via ORAL
  Filled 2018-06-24: qty 1

## 2018-06-24 MED ORDER — KETOROLAC TROMETHAMINE 30 MG/ML IJ SOLN
15.0000 mg | Freq: Once | INTRAMUSCULAR | Status: AC
  Administered 2018-06-24: 15 mg via INTRAVENOUS
  Filled 2018-06-24: qty 1

## 2018-06-24 MED ORDER — METOCLOPRAMIDE HCL 5 MG/ML IJ SOLN
10.0000 mg | Freq: Once | INTRAMUSCULAR | Status: AC
  Administered 2018-06-24: 10 mg via INTRAVENOUS
  Filled 2018-06-24: qty 2

## 2018-06-24 MED ORDER — ONDANSETRON 4 MG PO TBDP: 4.0000 mg | ORAL_TABLET | ORAL | 0 refills | Status: DC | PRN

## 2018-06-24 MED ORDER — TAMSULOSIN HCL 0.4 MG PO CAPS: 0.4000 mg | ORAL_CAPSULE | Freq: Every day | ORAL | 0 refills | Status: AC

## 2018-06-24 NOTE — Discharge Instructions (Signed)

## 2018-06-24 NOTE — ED Provider Notes (Signed)
[facility] EMERGENCY DEPARTMENT Provider Note   CSN: [id number] Arrival date & time: 06/24/18  1138     History   Chief Complaint Chief Complaint  Patient presents with  . Flank Pain  . Emesis    HPI April Ho is a 42 y.o. female who presents today for evaluation of RLQ pain and emesis.  She reports that she woke up at about 8 this morning and had sudden onset of right-sided lower abdominal pain with vomiting.  She denies any diarrhea.  No fevers at home.  She has not been sick recently.  Denies any dysuria, hematuria increased frequency or urgency.  She has never had any abdominal surgeries and still has her appendix.  She denies any vaginal discharge or pain.  HPI  Past Medical History:  Diagnosis Date  . Genital warts     Patient Active Problem List   Diagnosis Date Noted  . Genital warts 03/22/2018    Past Surgical History:  Procedure Laterality Date  . NO PAST SURGERIES       OB History   None      Home Medications    Prior to Admission medications   Medication Sig Start Date End Date Taking? Authorizing Provider  clotrimazole-betamethasone (LOTRISONE) cream APPLY EXTERNALLY TO THE AFFECTED AREA TWICE DAILY 04/13/18   Nani Ravens, Crosby Oyster, DO  esomeprazole (NEXIUM) 20 MG capsule Take 1 capsule (20 mg total) by mouth 2 (two) times daily before a meal. 06/07/18   Wendling, Crosby Oyster, DO  fluticasone (FLONASE) 50 MCG/ACT nasal spray Place 2 sprays into both nostrils daily. 08/14/17   Wendling, Crosby Oyster, DO  imiquimod (ALDARA) 5 % cream Apply topically 3 (three) times a week. 06/08/18   Shelda Pal, DO  levocetirizine (XYZAL) 5 MG tablet TAKE 1 TABLET(5 MG) BY MOUTH EVERY EVENING 04/11/18   Wendling, Crosby Oyster, DO  mupirocin ointment (BACTROBAN) 2 % Apply to areas twice daily. 06/18/18   Saguier, Percell Miller, PA-C  NAPROXEN DR 500 MG EC tablet TAKE 1 TABLET(500 MG) BY MOUTH TWICE DAILY WITH A MEAL 03/12/18   Wendling, Crosby Oyster,  DO  ondansetron (ZOFRAN ODT) 4 MG disintegrating tablet Take 1 tablet (4 mg total) by mouth every 4 (four) hours as needed for nausea or vomiting. 06/24/18   Lorin Glass, PA-C  oxyCODONE-acetaminophen (PERCOCET/ROXICET) 5-325 MG tablet Take 1 tablet by mouth every 6 (six) hours as needed for severe pain. 06/24/18   Lorin Glass, PA-C  oxymetazoline (ANEFRIN NASAL SPRAY) 0.05 % nasal spray Place 1 spray into both nostrils 2 (two) times daily.    [provider]  podofilox (CONDYLOX) 0.5 % gel Apply topically 2 (two) times daily. 06/18/18   Saguier, Percell Miller, PA-C  tamsulosin (FLOMAX) 0.4 MG CAPS capsule Take 1 capsule (0.4 mg total) by mouth daily for 14 days. 06/24/18 07/08/18  Lorin Glass, PA-C    Family History Family History  Problem Relation Age of Onset  . Cancer Neg Hx     Social History Social History   Tobacco Use  . Smoking status: Never Smoker  . Smokeless tobacco: Never Used  Substance Use Topics  . Alcohol use: No    Frequency: Never  . Drug use: No     Allergies   Patient has no known allergies.   Review of Systems Review of Systems  Constitutional: Negative for chills and fever.  Respiratory: Negative for chest tightness and shortness of breath.   Gastrointestinal: Positive for abdominal pain, nausea  and vomiting. Negative for constipation, diarrhea and rectal pain.  Genitourinary: Negative for dysuria, flank pain, pelvic pain, vaginal bleeding, vaginal discharge and vaginal pain.  Neurological: Negative for weakness.  All other systems reviewed and are negative.    Physical Exam Updated Vital Signs BP 120/83 (BP Location: Right Arm)   Pulse 83   Temp 98.2 F (36.8 C) (Oral)   Resp 16   SpO2 100%   Physical Exam  Constitutional: She is oriented to person, place, and time. She appears well-developed and well-nourished.  Appears uncomfortable  HENT:  Head: Normocephalic and atraumatic.  Eyes: Conjunctivae are normal.  Right eye exhibits no discharge. Left eye exhibits no discharge. No scleral icterus.  Neck: Normal range of motion.  Cardiovascular: Normal rate, regular rhythm, normal heart sounds and intact distal pulses.  Pulmonary/Chest: Effort normal and breath sounds normal. No stridor. No respiratory distress.  Abdominal: Soft. Bowel sounds are normal. She exhibits no distension and no mass. There is tenderness. There is no rebound and no guarding.  No CVA tenderness to percussion.  Right lower quadrant abdominal tenderness to palpation.  Genitourinary:  Genitourinary Comments: Exam performed with female EMT chaperone in room.  Normal external female genitalia.  Cervix is closed, no abnormal vaginal discharge.  Right adnexal tenderness to palpation, no adnexal fullness bilaterally or cervical motion tenderness.   Musculoskeletal: She exhibits no edema or deformity.  Neurological: She is alert and oriented to person, place, and time. She exhibits normal muscle tone.  Skin: Skin is warm and dry. She is not diaphoretic.  Psychiatric: She has a normal mood and affect. Her behavior is normal.  Nursing note and vitals reviewed.    ED Treatments / Results  Labs (all labs ordered are listed, but only abnormal results are displayed) Labs Reviewed  WET PREP, GENITAL - Abnormal; Notable for the following components:      Result Value   WBC, Wet Prep HPF POC FEW (*)    All other components within normal limits  URINALYSIS, ROUTINE W REFLEX MICROSCOPIC - Abnormal; Notable for the following components:   Specific Gravity, Urine >1.030 (*)    Ketones, ur 15 (*)    Protein, ur 30 (*)    All other components within normal limits  COMPREHENSIVE METABOLIC PANEL - Abnormal; Notable for the following components:   Glucose, Bld 158 (*)    Total Protein 8.2 (*)    All other components within normal limits  URINALYSIS, MICROSCOPIC (REFLEX) - Abnormal; Notable for the following components:   Bacteria, UA MANY (*)      All other components within normal limits  URINE CULTURE  PREGNANCY, URINE  LIPASE, BLOOD  CBC WITH DIFFERENTIAL/PLATELET  GC/CHLAMYDIA PROBE AMP ([facility]) NOT AT  Medical Center    EKG None  Radiology Ct Abdomen Pelvis W Contrast  Result Date: 06/24/2018 CLINICAL DATA:  Right-sided abdominal pain for several hours EXAM: CT ABDOMEN AND PELVIS WITH CONTRAST TECHNIQUE: Multidetector CT imaging of the abdomen and pelvis was performed using the standard protocol following bolus administration of intravenous contrast. CONTRAST:  115mL ISOVUE-300 IOPAMIDOL (ISOVUE-300) INJECTION 61% COMPARISON:  None. FINDINGS: Lower chest: No acute abnormality. Hepatobiliary: The liver is within normal limits. The gallbladder is well distended with dependent densities poorly calcified consistent with small stones. Pancreas: Unremarkable. No pancreatic ductal dilatation or surrounding inflammatory changes. Spleen: Normal in size without focal abnormality. Adrenals/Urinary Tract: The adrenal glands are within normal limits. The left kidney is unremarkable. The right kidney demonstrates delayed enhancement and hydronephrosis  as well as hydroureter. This extends inferiorly to the level of the right distal ureter. A 3 mm obstructing stone is noted at that level best seen on coronal image number 45 of series 5. No other calculi are identified. The bladder is decompressed. Stomach/Bowel: The appendix is within normal limits. The large and small bowel are unremarkable. The stomach is within normal limits. Vascular/Lymphatic: No significant vascular findings are present. No enlarged abdominal or pelvic lymph nodes. Reproductive: Uterus and bilateral adnexa are unremarkable. Other: No abdominal wall hernia or abnormality. No abdominopelvic ascites. Musculoskeletal: No acute or significant osseous findings. IMPRESSION: 3 mm distal right ureteral stone with hydronephrosis and hydroureter. Cholelithiasis without complicating factors. No  other focal abnormality is noted. Electronically Signed   By: Inez Catalina M.D.   On: 06/24/2018 13:30    Procedures Procedures (including critical care time)  Medications Ordered in ED Medications  HYDROmorphone (DILAUDID) injection 0.5 mg (0.5 mg Intravenous Given 06/24/18 1437)  ondansetron (ZOFRAN) injection 4 mg (4 mg Intravenous Given 06/24/18 1207)  morphine 4 MG/ML injection 4 mg (4 mg Intravenous Given 06/24/18 1207)  morphine 4 MG/ML injection 4 mg (4 mg Intravenous Given 06/24/18 1239)  iopamidol (ISOVUE-300) 61 % injection 100 mL (100 mLs Intravenous Contrast Given 06/24/18 1302)  metoCLOPramide (REGLAN) injection 10 mg (10 mg Intravenous Given 06/24/18 1434)  ketorolac (TORADOL) 30 MG/ML injection 15 mg (15 mg Intravenous Given 06/24/18 1603)  oxyCODONE-acetaminophen (PERCOCET/ROXICET) 5-325 MG per tablet 1 tablet (1 tablet Oral Given 06/24/18 1604)     Initial Impression / Assessment and Plan / ED Course  I have reviewed the triage vital signs and the nursing notes.  Pertinent labs & imaging results that were available during my care of the patient were reviewed by me and considered in my medical decision making (see chart for details).  Clinical Course as of Jun 24 1716  Nancy Fetter Jun 24, 2018  1217 Orders placed for ultrasound, informed that ultrasound is not available here today.   [EH]  1226 Patient reevaluated, pain is still not controlled.  Will repeat morphine.    [EH]  1424 Patient updated on results.  She was sleeping when I walked in the room however states her pain is still a 10 out of 10.  Will give nausea medicine and additional morphine.  Placed on pulse oximetry.   [EH]  1640 Patient says her pain is currently a 6 out of 10.  She is requesting to be allowed to sleep for another hour.  Given the current flow in the department not able to honor this request at this time.   [EH]  1716 Patient is seated upright in bed.  Body language continues not to appear  consistent with reported level of pain.    [EH]    Clinical Course User Index [EH] Lorin Glass, PA-C   Patient presents today for evaluation of right lower quadrant abdominal pain.  She had sudden onset at 8 AM of her pain.  She has had nausea and vomiting.  On physical exam she did not have CVA tenderness.  Based on the sudden onset concern for ovarian torsion.  Ultrasound not available here today.  However patient also does pain in the right lower quadrant, and still has her appendix, so therefore concern for appendicitis and stone.  Pelvic exam was performed with right adnexal tenderness, however tenderness was mild, I would anticipate much more pain if she was truly having a torsion, no cervical motion tenderness.   Labs  are obtained and reviewed, urine is not consistent with infection.  Otherwise labs are unremarkable.  Patient had CT scan abdomen pelvis performed showing a right-sided ureteral stone with right-sided hydronephrosis.  Given stone, feel that cause for patient's symptoms have been found, and pelvic ultrasound is not necessary given probable cause of symptoms.  Patient's pain was treated in the emergency room with 2 doses of morphine, Dilaudid, Percocet, Toradol, and her nausea was treated with Zofran and Reglan with resolution.    She is given a strainer to strain her urine.  She is given prescriptions for Percocet, after she was checked in the PMP database, Zofran, and Flomax.  She is given follow-up with urology as needed.  She is instructed that if her symptoms worsen she should go to [facility] long emergency room for repeat evaluation.  Return precautions were discussed with patient who states their understanding.  At the time of discharge patient denied any unaddressed complaints or concerns.  Patient is agreeable for discharge home.   Final Clinical Impressions(s) / ED Diagnoses   Final diagnoses:  Ureteral stone with hydronephrosis  Non-intractable vomiting with  nausea, unspecified vomiting type    ED Discharge Orders         Ordered    oxyCODONE-acetaminophen (PERCOCET/ROXICET) 5-325 MG tablet  Every 6 hours PRN     06/24/18 1644    ondansetron (ZOFRAN ODT) 4 MG disintegrating tablet  Every 4 hours PRN     06/24/18 1644    tamsulosin (FLOMAX) 0.4 MG CAPS capsule  Daily     06/24/18 1711           Lorin Glass, Hershal Coria 06/24/18 2209    Charlesetta Shanks, MD 06/27/18 [phone]

## 2018-06-24 NOTE — ED Provider Notes (Signed)
Medical screening examination/treatment/procedure(s) were conducted as a shared visit with non-physician practitioner(s) and myself.  I personally evaluated the patient during the encounter.  None Patient with sudden onset of right lower quadrant pain.  Severe in nature.  Vomiting associated.  Patient is seen after pain control.  She is resting but appropriate.  No respiratory distress.  No reproducible pain in the flank of the lower abdomen.  CT confirms a 3 mm stone.  Anticipate that that will pass.  Plan to prescribe Zofran, ibuprofen, tamsulosin and Percocet.  I agree with plan and management.   Charlesetta Shanks, MD 06/24/18 1540

## 2018-06-24 NOTE — ED Notes (Signed)
When pt returned to room from RR, pt vomited, first time per pt.

## 2018-06-24 NOTE — ED Triage Notes (Signed)
Pt c/o right flank pain with vomiting x 2 hours

## 2018-06-25 LAB — URINE CULTURE: CULTURE: NO GROWTH

## 2018-06-25 LAB — GC/CHLAMYDIA PROBE AMP (~~LOC~~) NOT AT ARMC
Chlamydia: NEGATIVE
Neisseria Gonorrhea: NEGATIVE

## 2018-06-25 IMAGING — US US ABDOMEN LIMITED
1 series · 13 of 13 positions shown · non-contrast
Comparison: None.

CLINICAL DATA: Painful firm area of palpable concern 3.5 Cm
superior to the left iliac crest for 1 and half years.

EXAM:
ULTRASOUND ABDOMEN LIMITED

[Series 1: us abdomen limited · 0.05mm/px · 13 of 13 slices shown]
[im 1/13]
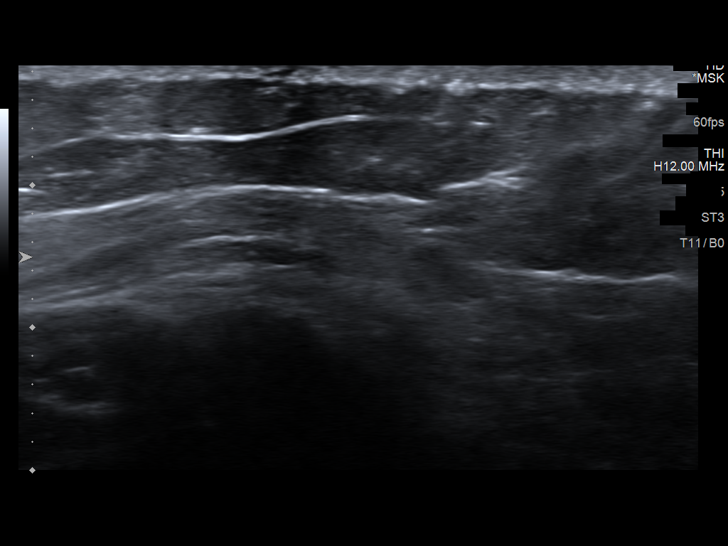
[im 2/13]
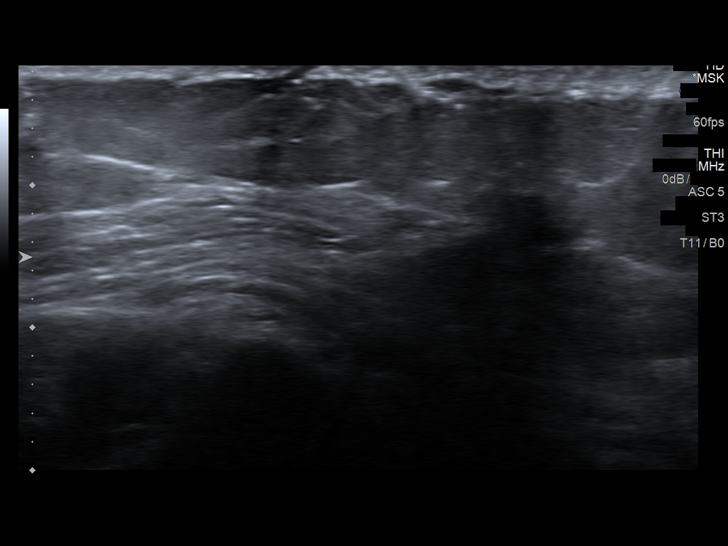
[im 3/13]
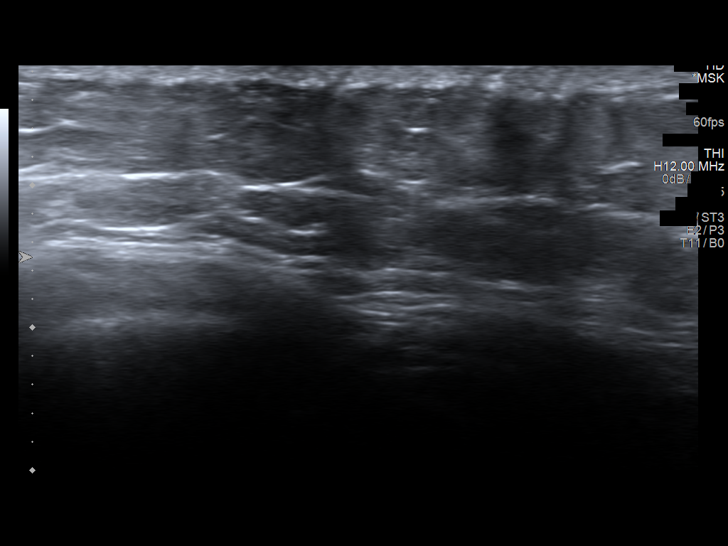
[im 4/13]
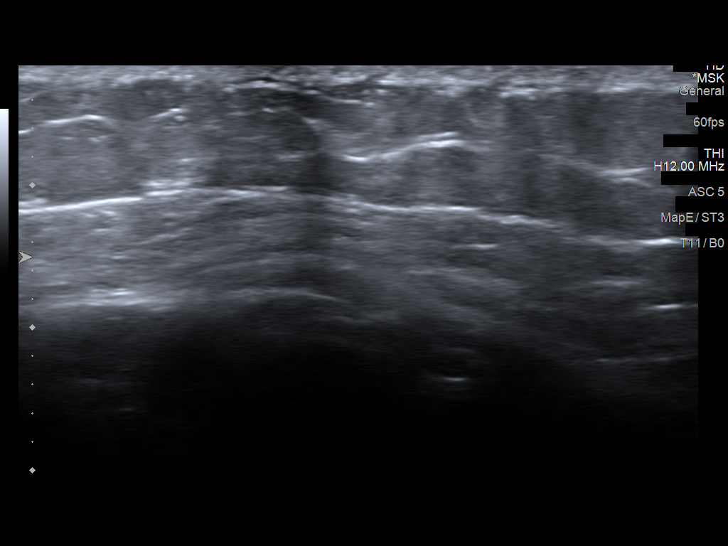
[im 5/13]
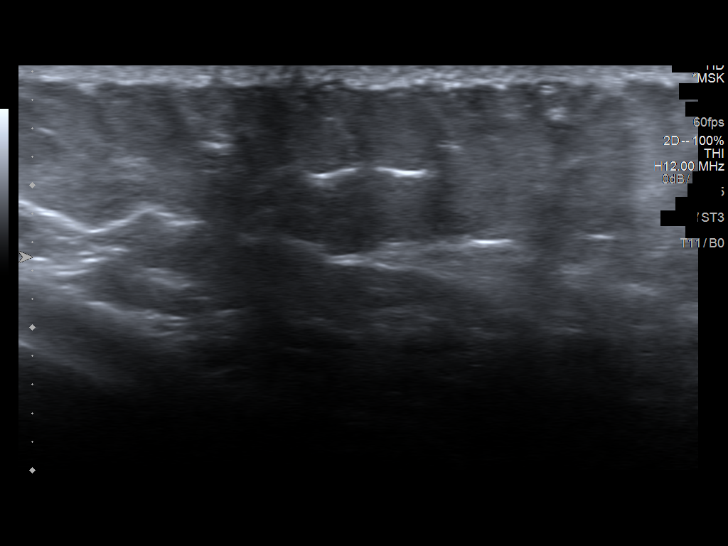
[im 6/13]
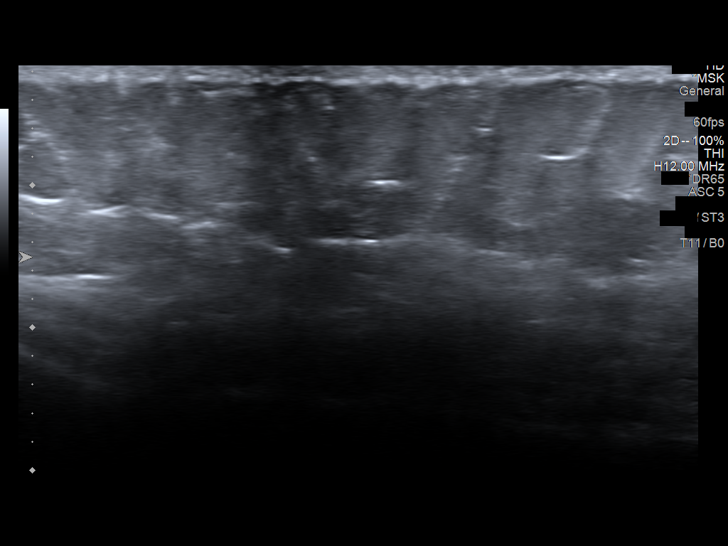
[im 7/13]
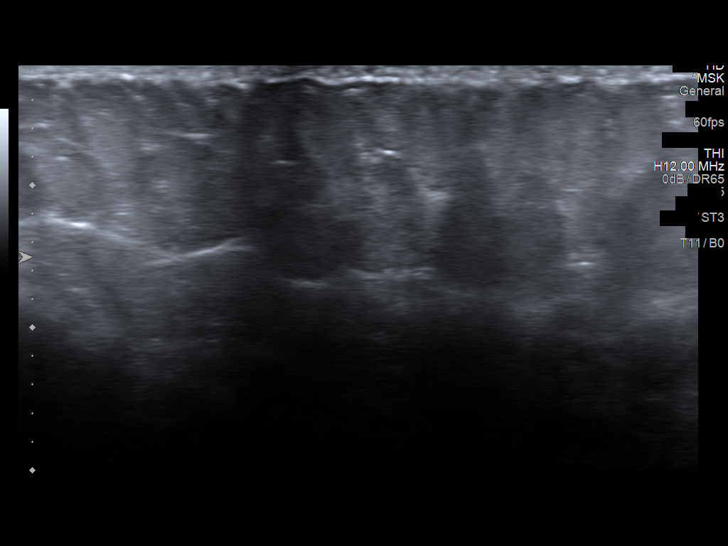
[im 8/13]
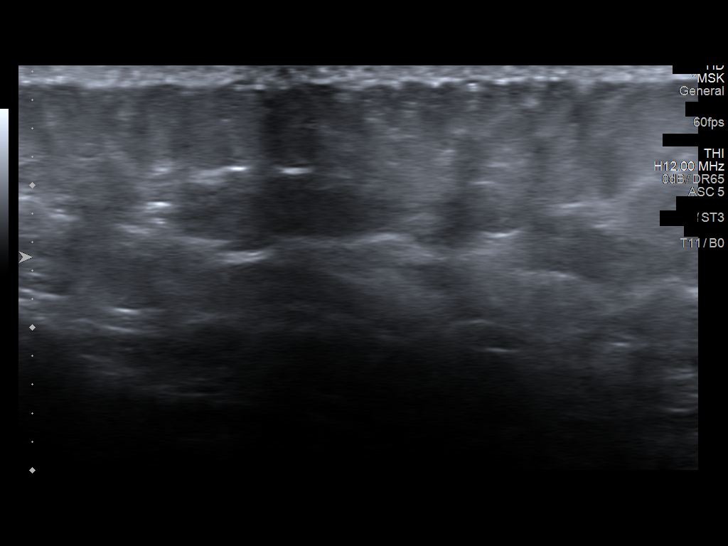
[im 9/13]
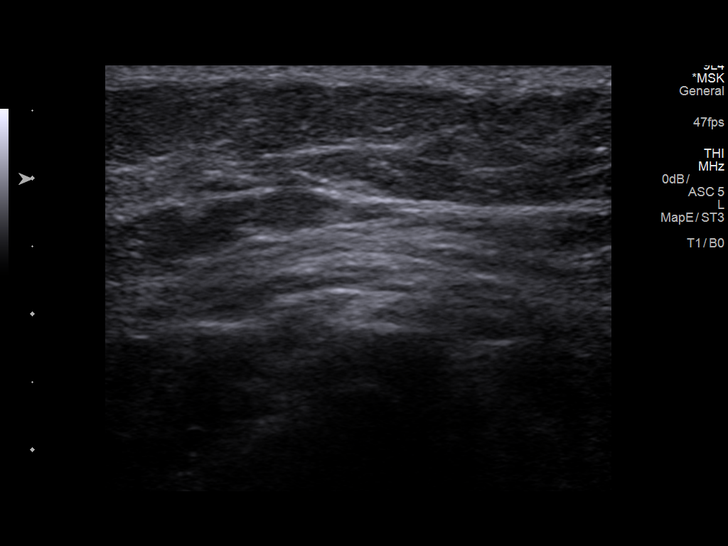
[im 10/13]
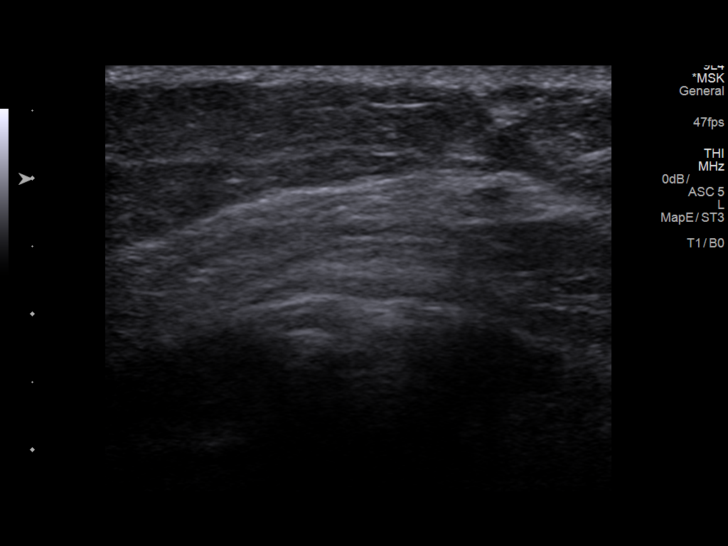
[im 11/13]
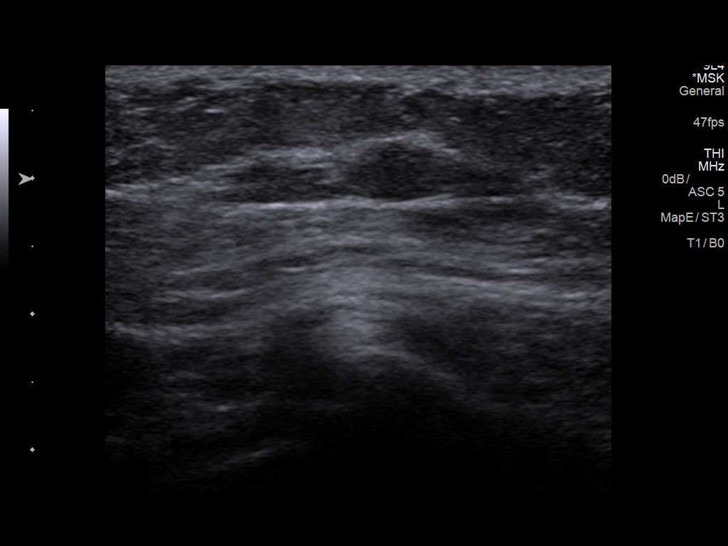
[im 12/13]
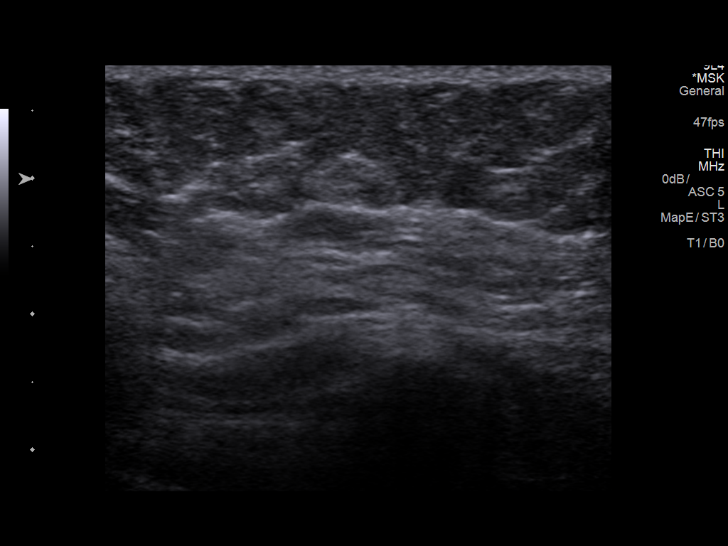
[im 13/13]
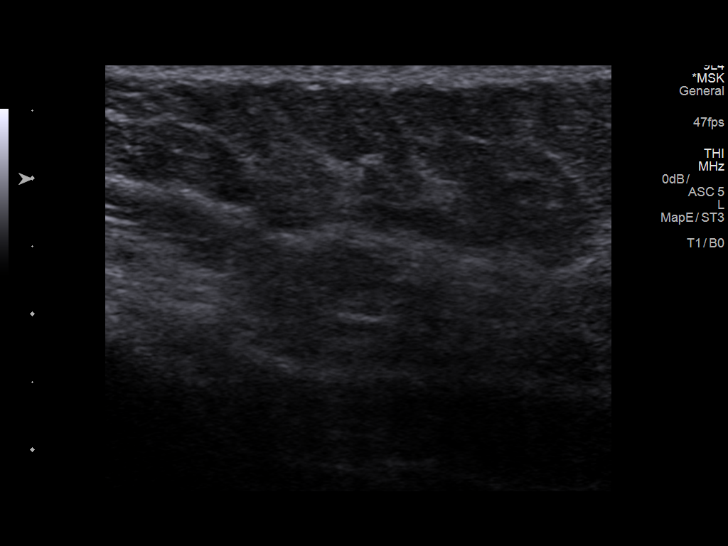

[13 of 13 positions shown; findings below may reference images not displayed]

FINDINGS: Static images from focused grayscale ultrasound evaluation of the
area of palpable concern in the lateral left abdominal wall
demonstrates no sonographically apparent mass or other suspicious
abnormality.
IMPRESSION: No sonographic correlation was found to the area of palpable concern
in the left lateral abdominal wall.

## 2018-06-29 ENCOUNTER — Ambulatory Visit: Payer: BLUE CROSS/BLUE SHIELD | Admitting: Family Medicine

## 2018-06-29 ENCOUNTER — Encounter

## 2018-06-29 ENCOUNTER — Encounter: Payer: Self-pay | Admitting: Family Medicine

## 2018-06-29 VITALS — BP 108/80 | HR 88 | Temp 98.1°F | Ht 66.0 in | Wt 193.2 lb

## 2018-06-29 DIAGNOSIS — N200 Calculus of kidney: Secondary | ICD-10-CM

## 2018-06-29 MED ORDER — OXYCODONE-ACETAMINOPHEN 5-325 MG PO TABS: 1.0000 | ORAL_TABLET | Freq: Four times a day (QID) | ORAL | 0 refills | Status: DC | PRN

## 2018-06-29 MED ORDER — PROMETHAZINE HCL 25 MG PO TABS: 25.0000 mg | ORAL_TABLET | Freq: Three times a day (TID) | ORAL | 0 refills | Status: DC | PRN

## 2018-06-29 NOTE — Patient Instructions (Addendum)
Stay hydrated.  Healthy Eating Plan Many factors influence your heart health, including eating and exercise habits. Heart (coronary) risk increases with abnormal blood fat (lipid) levels. Heart-healthy meal planning includes limiting unhealthy fats, increasing healthy fats, and making other small dietary changes. This includes maintaining a healthy body weight to help keep lipid levels within a normal range.  WHAT IS MY PLAN?  Your health care provider recommends that you:  Drink a glass of water before meals to help with satiety.  Eat slowly.  An alternative to the water is to add Metamucil. This will help with satiety as well. It does contain calories, unlike water.  WHAT TYPES OF FAT SHOULD I CHOOSE?  Choose healthy fats more often. Choose monounsaturated and polyunsaturated fats, such as olive oil and canola oil, flaxseeds, walnuts, almonds, and seeds.  Eat more omega-3 fats. Good choices include salmon, mackerel, sardines, tuna, flaxseed oil, and ground flaxseeds. Aim to eat fish at least two times each week.  Avoid foods with partially hydrogenated oils in them. These contain trans fats. Examples of foods that contain trans fats are stick margarine, some tub margarines, cookies, crackers, and other baked goods. If you are going to avoid a fat, this is the one to avoid!  WHAT GENERAL GUIDELINES DO I NEED TO FOLLOW?  Check food labels carefully to identify foods with trans fats. Avoid these types of options when possible.  Fill one half of your plate with vegetables and green salads. Eat 4-5 servings of vegetables per day. A serving of vegetables equals 1 cup of raw leafy vegetables,  cup of raw or cooked cut-up vegetables, or  cup of vegetable juice.  Fill one fourth of your plate with whole grains. Look for the word "whole" as the first word in the ingredient list.  Fill one fourth of your plate with lean protein foods.  Eat 4-5 servings of fruit per day. A serving of fruit  equals one medium whole fruit,  cup of dried fruit,  cup of fresh, frozen, or canned fruit. Try to avoid fruits in cups/syrups as the sugar content can be high.  Eat more foods that contain soluble fiber. Examples of foods that contain this type of fiber are apples, broccoli, carrots, beans, peas, and barley. Aim to get 20-30 g of fiber per day.  Eat more home-cooked food and less restaurant, buffet, and fast food.  Limit or avoid alcohol.  Limit foods that are high in starch and sugar.  Avoid fried foods when able.  Cook foods by using methods other than frying. Baking, boiling, grilling, and broiling are all great options. Other fat-reducing suggestions include: ? Removing the skin from poultry. ? Removing all visible fats from meats. ? Skimming the fat off of stews, soups, and gravies before serving them. ? Steaming vegetables in water or broth.  Lose weight if you are overweight. Losing just 5-10% of your initial body weight can help your overall health and prevent diseases such as diabetes and heart disease.  Increase your consumption of nuts, legumes, and seeds to 4-5 servings per week. One serving of dried beans or legumes equals  cup after being cooked, one serving of nuts equals 1 ounces, and one serving of seeds equals  ounce or 1 tablespoon.  WHAT ARE GOOD FOODS CAN I EAT? Grains Grainy breads (try to find bread that is 3 g of fiber per slice or greater), oatmeal, light popcorn. Whole-grain cereals. Rice and pasta, including brown rice and those that are made  with whole wheat. Edamame pasta is a great alternative to grain pasta. It has a higher protein content. Try to avoid significant consumption of white bread, sugary cereals, or pastries/baked goods.  Vegetables All vegetables. Cooked white potatoes do not count as vegetables.  Fruits All fruits, but limit pineapple and bananas as these fruits have a higher sugar content.  Meats and Other Protein Sources Lean,  well-trimmed beef, veal, pork, and lamb. Chicken and [place] without skin. All fish and shellfish. Wild duck, rabbit, pheasant, and venison. Egg whites or low-cholesterol egg substitutes. Dried beans, peas, lentils, and tofu.Seeds and most nuts.  Dairy Low-fat or nonfat cheeses, including ricotta, string, and mozzarella. Skim or 1% milk that is liquid, powdered, or evaporated. Buttermilk that is made with low-fat milk. Nonfat or low-fat yogurt. Soy/Almond milk are good alternatives if you cannot handle dairy.  Beverages Water is the best for you. Sports drinks with less sugar are more desirable unless you are a highly active athlete.  Sweets and Desserts Sherbets and fruit ices. Honey, jam, marmalade, jelly, and syrups. Dark chocolate.  Eat all sweets and desserts in moderation.  Fats and Oils Nonhydrogenated (trans-free) margarines. Vegetable oils, including soybean, sesame, sunflower, olive, peanut, safflower, corn, canola, and cottonseed. Salad dressings or mayonnaise that are made with a vegetable oil. Limit added fats and oils that you use for cooking, baking, salads, and as spreads.  Other Cocoa powder. Coffee and tea. Most condiments.  The items listed above may not be a complete list of recommended foods or beverages. Contact your dietitian for more options.

## 2018-06-29 NOTE — Progress Notes (Signed)
Chief Complaint  Patient presents with  . Follow-up    Subjective: Patient is a 42 y.o. female here for ED f/u.  Patient went to the emergency department on 02/21/2018 for right flank pain.  She was found to have a kidney stone.  She is placed on Flomax, Zofran, and oxycodone.  She's been urinating frequently since this started.  She still having left-sided flank pain no no evidence of kidney stone on that side was found.  She was initially using naproxen for this issue. No blood in urine.  Having some nausea.  Pain on R side is nearly resolved.  She was not straining her urine and does not recall passing the stone.   ROS: GU: + Frequency MSK: As noted in HPI  Past Medical History:  Diagnosis Date  . Genital warts     Objective: BP 108/80 (BP Location: Left Arm, Patient Position: Sitting, Cuff Size: Normal)   Pulse 88   Temp 98.1 F (36.7 C) (Oral)   Ht 5\' 6"  (1.676 m)   Wt 193 lb 4 oz (87.7 kg)   SpO2 99%   BMI 31.19 kg/m  General: Awake, appears stated age HEENT: MMM, EOMi Heart: RRR Lungs: CTAB, no rales, wheezes or rhonchi. No accessory muscle use MSK: Negative Lloyd sign bilaterally, there is tenderness to palpation over the lower left rib cage. Abd: Bowel sounds present, soft, nontender, nondistended, no masses or organomegaly Psych: Age appropriate judgment and insight, normal affect and mood  Assessment and Plan: Kidney stone - Plan: oxyCODONE-acetaminophen (PERCOCET/ROXICET) 5-325 MG tablet, promethazine (PHENERGAN) 25 MG tablet  We will stop Zofran and start Phenergan for nausea.  Continue oxycodone as needed for breakthrough pain.  Naproxen for initial pain.  I believe the left side of her flank is related to muscular skeletal etiology rather than renal stone given an unremarkable CT scan. Follow-up in 2 weeks of urinary frequency and pain are not resolved.  Cancel appointment otherwise. The patient voiced understanding and agreement to the plan.  [place], DO 06/29/18  4:10 PM

## 2018-06-29 NOTE — Progress Notes (Signed)
Pre visit review using our clinic review tool, if applicable. No additional management support is needed unless otherwise documented below in the visit note. 

## 2018-07-03 ENCOUNTER — Ambulatory Visit: Payer: BLUE CROSS/BLUE SHIELD | Admitting: Family Medicine

## 2018-07-25 ENCOUNTER — Telehealth: Payer: Self-pay | Admitting: Family Medicine

## 2018-07-25 NOTE — Telephone Encounter (Signed)
Copied from [place] [id number]. Topic: General - Other >> Jul 25, 2018  3:14 PM Valla Leaver wrote: Reason for CRM: Patient needs a cpe before January 2020 with [facility] and needs to know if she can be worked in. 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  Could you please help with getting her scheduled at either of the times he suggested//I am unable.

## 2018-07-25 NOTE — Telephone Encounter (Signed)
1115 on 12/27 or 730 on 12/30. TY.

## 2018-07-26 ENCOUNTER — Telehealth: Payer: Self-pay | Admitting: Family Medicine

## 2018-07-26 ENCOUNTER — Ambulatory Visit: Payer: Self-pay | Admitting: *Deleted

## 2018-07-26 NOTE — Telephone Encounter (Signed)
Copied from [place] [id number]. Topic: General - Other >> Jul 25, 2018  3:14 PM Valla Leaver wrote: Reason for CRM: Patient needs a cpe before January 2020 with [facility] and needs to know if she can be worked in. 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 >> Jul 26, 2018  4:17 PM Virl Axe D wrote: Pt called back to add that she needs to have physical done before 08/16/18

## 2018-07-26 NOTE — Telephone Encounter (Signed)
Minimal vaginal bleeding noted today. Received 1st Depo Provera injection 06/07/18. Red-purplish spotting started this morning. She is just now using pad #2. Denies fever/odor/abdominal cramps/pain/pregnancy.Reviewed protocol advice care and symptoms to call back for. Stated she understood.   Reason for Disposition . [1] Irregular vaginal bleeding or spotting AND [2] getting the birth control shot  Answer Assessment - Initial Assessment Questions 1. TYPE: "What type of birth control shot are you getting?"  (e.g., Depo-Provera or Depo-subQ Provera 104 shot given every 3 months)     Depo Provera 2. START DATE: "When did you first start getting the birth control shot? When was the last injection?"     06/07/18 was her 1st injection  3. SYMPTOM: "What is the main symptom (or question) you're concerned about?"     Red to purplish spotting 4. LOCATION: "Where is bleeding located?" (e.g., abdomen, inside/outside, right/left)     vagina 5. ONSET: "When did the spotting start?"     today 6. VAGINAL BLEEDING: "Are you having any unusual vaginal bleeding?"     - NONE     - SPOTTING: spotting or pinkish / brownish mucous discharge; does not fill panty-liner or pad     - MILD: less than 1 pad / hour; less than patient's usual menstrual bleeding     - MODERATE: 1-2 pads / hour; small-medium blood clots (e.g., pea, grape, small coin)     - SEVERE: soaking 2 or more pads/hour for 2 or more hours; bleeding not contained by pads or tampons     Just now on her 2nd pad for the day. 7. PAIN: "Is there any pain?" (Scale: 1-10; mild, moderate, severe).     no 8. PREGNANCY: "Are you concerned you might be pregnant?" "When was your last menstrual period?"     October  Protocols used: [place]

## 2018-07-26 NOTE — Telephone Encounter (Signed)
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 No appointment's identified before Dr. Irene Limbo cut off of 3:00p for CPE's. Informed patient and placed her on the waiting list. Routing to PCP for appointment consideration.

## 2018-07-26 NOTE — Telephone Encounter (Signed)
If she needs it before 1/9, OK to do 1/2 or 1/3. Find a slot at the end of the morning otherwise. TY.

## 2018-08-06 ENCOUNTER — Other Ambulatory Visit: Payer: Self-pay | Admitting: Family Medicine

## 2018-08-06 DIAGNOSIS — W57XXXS Bitten or stung by nonvenomous insect and other nonvenomous arthropods, sequela: Secondary | ICD-10-CM

## 2018-08-07 ENCOUNTER — Encounter

## 2018-08-10 ENCOUNTER — Ambulatory Visit (INDEPENDENT_AMBULATORY_CARE_PROVIDER_SITE_OTHER): Payer: BLUE CROSS/BLUE SHIELD | Admitting: Family Medicine

## 2018-08-10 ENCOUNTER — Encounter: Payer: Self-pay | Admitting: Family Medicine

## 2018-08-10 VITALS — BP 120/68 | HR 77 | Temp 99.1°F | Ht 66.0 in | Wt 196.5 lb

## 2018-08-10 DIAGNOSIS — L989 Disorder of the skin and subcutaneous tissue, unspecified: Secondary | ICD-10-CM

## 2018-08-10 DIAGNOSIS — Z23 Encounter for immunization: Secondary | ICD-10-CM | POA: Diagnosis not present

## 2018-08-10 DIAGNOSIS — Z308 Encounter for other contraceptive management: Secondary | ICD-10-CM

## 2018-08-10 DIAGNOSIS — Z0000 Encounter for general adult medical examination without abnormal findings: Secondary | ICD-10-CM | POA: Diagnosis not present

## 2018-08-10 DIAGNOSIS — Z1239 Encounter for other screening for malignant neoplasm of breast: Secondary | ICD-10-CM | POA: Diagnosis not present

## 2018-08-10 LAB — COMPREHENSIVE METABOLIC PANEL
ALT: 12 U/L (ref 0–35)
AST: 12 U/L (ref 0–37)
Albumin: 4.4 g/dL (ref 3.5–5.2)
Alkaline Phosphatase: 96 U/L (ref 39–117)
BILIRUBIN TOTAL: 0.5 mg/dL (ref 0.2–1.2)
BUN: 9 mg/dL (ref 6–23)
CALCIUM: 9.2 mg/dL (ref 8.4–10.5)
CO2: 25 meq/L (ref 19–32)
Chloride: 107 mEq/L (ref 96–112)
Creatinine, Ser: 0.77 mg/dL (ref 0.40–1.20)
GFR: 106.25 mL/min (ref >60.00)
Glucose, Bld: 94 mg/dL (ref 70–99)
POTASSIUM: 4.1 meq/L (ref 3.5–5.1)
Sodium: 141 mEq/L (ref 135–145)
Total Protein: 6.8 g/dL (ref 6.0–8.3)

## 2018-08-10 LAB — CBC
HEMATOCRIT: 40.3 % (ref 36.0–46.0)
HEMOGLOBIN: 13.5 g/dL (ref 12.0–15.0)
MCHC: 33.5 g/dL (ref 30.0–36.0)
MCV: 96.4 fl (ref 78.0–100.0)
PLATELETS: 276 10*3/uL (ref 150.0–400.0)
RBC: 4.18 Mil/uL (ref 3.87–5.11)
RDW: 13.4 % (ref 11.5–15.5)
WBC: 4.9 10*3/uL (ref 4.0–10.5)

## 2018-08-10 LAB — LIPID PANEL
CHOL/HDL RATIO: 4
Cholesterol: 147 mg/dL (ref 0–200)
HDL: 36.8 mg/dL — ABNORMAL LOW (ref >39.00)
LDL Cholesterol: 97 mg/dL (ref 0–99)
NonHDL: 110.56
Triglycerides: 69 mg/dL (ref 0.0–149.0)
VLDL: 13.8 mg/dL (ref 0.0–40.0)

## 2018-08-10 MED ORDER — METHYLPREDNISOLONE ACETATE 80 MG/ML IJ SUSP
80.0000 mg | Freq: Once | INTRAMUSCULAR | Status: AC
  Administered 2018-08-10: 80 mg via INTRAMUSCULAR

## 2018-08-10 MED ORDER — MEDROXYPROGESTERONE ACETATE 150 MG/ML IM SUSP
150.0000 mg | Freq: Once | INTRAMUSCULAR | Status: AC
  Administered 2018-08-10: 150 mg via INTRAMUSCULAR

## 2018-08-10 NOTE — Progress Notes (Signed)
Chief Complaint  Patient presents with  . Annual Exam     Well Woman April Ho is here for a complete physical.   Her last physical was >1 year ago.  Current diet: in general, feel that she is eating too more. Current exercise: none. Active at work. Weight is stable and she denies daytime fatigue. No LMP recorded. Patient has had an injection. Seatbelt? Yes  Health Maintenance Pap/HPV- No  Mammogram- No Tetanus- No HIV screening- Yes - 7 years  Past Medical History:  Diagnosis Date  . Genital warts      Past Surgical History:  Procedure Laterality Date  . NO PAST SURGERIES     Medications  Current Outpatient Medications on File Prior to Visit  Medication Sig Dispense Refill  . esomeprazole (NEXIUM) 20 MG capsule Take 1 capsule (20 mg total) by mouth 2 (two) times daily before a meal. 60 capsule 3  . imiquimod (ALDARA) 5 % cream Apply topically 3 (three) times a week. 12 each 0  . levocetirizine (XYZAL) 5 MG tablet TAKE 1 TABLET(5 MG) BY MOUTH EVERY EVENING 30 tablet 0  . NAPROXEN DR 500 MG EC tablet TAKE 1 TABLET(500 MG) BY MOUTH TWICE DAILY WITH A MEAL 60 tablet 0   Allergies No Known Allergies  Review of Systems: Constitutional:  no unexpected weight changes Eye:  no recent significant change in vision Ear/Nose/Mouth/Throat:  Ears:  no tinnitus or vertigo and no recent change in hearing Nose/Mouth/Throat:  no complaints of nasal congestion, no sore throat Cardiovascular: no chest pain Respiratory:  no cough and no shortness of breath Gastrointestinal:  no abdominal pain, no change in bowel habits GU:  Female: negative for dysuria or pelvic pain Musculoskeletal/Extremities:  no pain of the joints Integumentary (Skin/Breast): +skin lesions on extremities and torso; otherwise no abnormal skin lesions reported Neurologic:  no headaches Endocrine:  denies fatigue Hematologic/Lymphatic:  No areas of easy bleeding  Exam BP 120/68 (BP Location: Left Arm, Patient  Position: Sitting, Cuff Size: Normal)   Pulse 77   Temp 99.1 F (37.3 C) (Oral)   Ht 5\' 6"  (1.676 m)   Wt 196 lb 8 oz (89.1 kg)   SpO2 98%   BMI 31.72 kg/m  General:  well developed, well nourished, in no apparent distress Skin: various excoriated lesions on extremities and torso, some scaling present, usually measuring around 0.5 cm in diameter; otherwise no significant moles, warts, or growths Head:  no masses, lesions, or tenderness Eyes:  pupils equal and round, sclera anicteric without injection Ears:  canals without lesions, TMs shiny without retraction, no obvious effusion, no erythema Nose:  nares patent, septum midline, mucosa normal, and no drainage or sinus tenderness Throat/Pharynx:  lips and gingiva without lesion; tongue and uvula midline; non-inflamed pharynx; no exudates or postnasal drainage Neck: neck supple without adenopathy, thyromegaly, or masses Lungs:  clear to auscultation, breath sounds equal bilaterally, no respiratory distress Cardio:  regular rate and rhythm, no bruits, no LE edema Abdomen:  abdomen soft, nontender; bowel sounds normal; no masses or organomegaly Genital: Defer to GYN Musculoskeletal:  symmetrical muscle groups noted without atrophy or deformity Extremities:  no clubbing, cyanosis, or edema, no deformities, no skin discoloration Neuro:  gait normal; deep tendon reflexes normal and symmetric Psych: well oriented with flat affect and appropriate judgment/insight  Assessment and Plan  Well adult exam - Plan: CBC, Comprehensive metabolic panel, Lipid panel  Skin lesion - Plan: Ambulatory referral to Dermatology  Screening for breast cancer -  Plan: MM DIGITAL SCREENING BILATERAL   Well 43 y.o. female.  Counseled on diet and exercise. Other orders as above.  For itchy lesions, will refer to derm for their opinion. Follow up 1 yr or prn. The patient voiced understanding and agreement to the plan.  [place],  DO 08/10/18 8:07 AM

## 2018-08-10 NOTE — Patient Instructions (Addendum)
Give us 2-3 business days to get the results of your labs back.   Keep the diet clean and stay active.  If you do not hear anything about your referral in the next 1-2 weeks, call our office and ask for an update.  Let us know if you need anything. 

## 2018-08-10 NOTE — Addendum Note (Signed)
Addended by: Sharon Seller B on: 08/10/2018 08:28 AM   Modules accepted: Orders

## 2018-08-23 ENCOUNTER — Ambulatory Visit (INDEPENDENT_AMBULATORY_CARE_PROVIDER_SITE_OTHER): Payer: BLUE CROSS/BLUE SHIELD | Admitting: Family Medicine

## 2018-08-23 ENCOUNTER — Encounter: Payer: Self-pay | Admitting: Family Medicine

## 2018-08-23 VITALS — BP 120/78 | HR 82 | Temp 98.6°F | Ht 67.0 in | Wt 196.0 lb

## 2018-08-23 DIAGNOSIS — R109 Unspecified abdominal pain: Secondary | ICD-10-CM | POA: Diagnosis not present

## 2018-08-23 DIAGNOSIS — T7840XA Allergy, unspecified, initial encounter: Secondary | ICD-10-CM

## 2018-08-23 MED ORDER — METHYLPREDNISOLONE ACETATE 80 MG/ML IJ SUSP
80.0000 mg | Freq: Once | INTRAMUSCULAR | Status: AC
  Administered 2018-08-23: 80 mg via INTRAMUSCULAR

## 2018-08-23 MED ORDER — FAMOTIDINE 20 MG PO TABS: 20.0000 mg | ORAL_TABLET | Freq: Two times a day (BID) | ORAL | 0 refills | Status: DC

## 2018-08-23 MED ORDER — PREDNISONE 20 MG PO TABS: 40.0000 mg | ORAL_TABLET | Freq: Every day | ORAL | 0 refills | Status: AC

## 2018-08-23 NOTE — Patient Instructions (Signed)
Continue the Xyzal.   Pepcid (famotidine) is the other medicine we recommend. Take 20 mg twice daily.  If things get worse or if you get shortness of breath, seek immediate care.  Let us know if you need anything.

## 2018-08-23 NOTE — Progress Notes (Signed)
Pre visit review using our clinic review tool, if applicable. No additional management support is needed unless otherwise documented below in the visit note. 

## 2018-08-23 NOTE — Progress Notes (Signed)
Chief Complaint  Patient presents with  . Allergic Reaction    April Ho is a 43 y.o. female here for an allergic reaction.  Duration: started this AM after a biscuit sandwich Any new medications, lotions, soaps, topicals or detergents? No ACEi/ARB/Estrogen? No Hx of allergic rxn/angioedema/anaphylaxis? No she specifically denies shortness of breath, tongue  swelling, or swelling in the throat.   ROS Allergic: As noted in HPI Pulmonary: No SOB MSK: No masses  Past Medical History:  Diagnosis Date  . Genital warts     Family History  Problem Relation Age of Onset  . Cancer Neg Hx     BP 120/78 (BP Location: Left Arm, Patient Position: Sitting, Cuff Size: Normal)   Pulse 82   Temp 98.6 F (37 C) (Oral)   Ht 5\' 7"  (1.702 m)   Wt 196 lb (88.9 kg)   SpO2 98%   BMI 30.70 kg/m  General: Well appearing, appearing stated age, well-nourished, awake HEENT: Ears are patent, TM's negative, Nose patent without discharge, MMM, tongue without deviation or edema, uvula without edema, pharynx without erythema or petechiae; Neck without masses, edema or asymmetry Heart: RRR, no murmurs Lungs: CTAB, no rales or stridor, normal respiratory effort without accessory muscle use Neuro: Alert and oriented, fluent and goal-oriented speech Skin: Exposed skin is warm and dry without lesion Psych: Age appropriate judgment and insight, normal affect and mood  Allergic reaction, initial encounter - Plan: predniSONE (DELTASONE) 20 MG tablet, famotidine (PEPCID) 20 MG tablet, methylPREDNISolone acetate (DEPO-MEDROL) injection 80 mg  Flank pain - Plan: naproxen (NAPROXEN DR) 500 MG EC tablet  Orders as above. Avoid breakfast sandwiches. Records reviewed.  Pt informed to seek emergent care if starting to experience SOB, swelling with tongue or airway/neck.  F/u prn if symptoms do not resolve. The patient voiced understanding and agreement to the plan.  [place], DO 08/23/18 9:14  AM

## 2018-09-08 ENCOUNTER — Other Ambulatory Visit: Payer: Self-pay | Admitting: Family Medicine

## 2018-09-08 DIAGNOSIS — R109 Unspecified abdominal pain: Secondary | ICD-10-CM

## 2018-10-05 ENCOUNTER — Other Ambulatory Visit: Payer: Self-pay | Admitting: Family Medicine

## 2018-10-05 DIAGNOSIS — W57XXXS Bitten or stung by nonvenomous insect and other nonvenomous arthropods, sequela: Secondary | ICD-10-CM

## 2019-01-05 IMAGING — CR DG CHEST 2V
2 series · 2 of 2 positions shown · non-contrast
Comparison: None

CLINICAL DATA: URI for 1 month, nasal and chest congestion, central
chest pain, fever, nausea and vomiting today, lost Onze

EXAM:
CHEST  2 VIEW

[w chest pa]
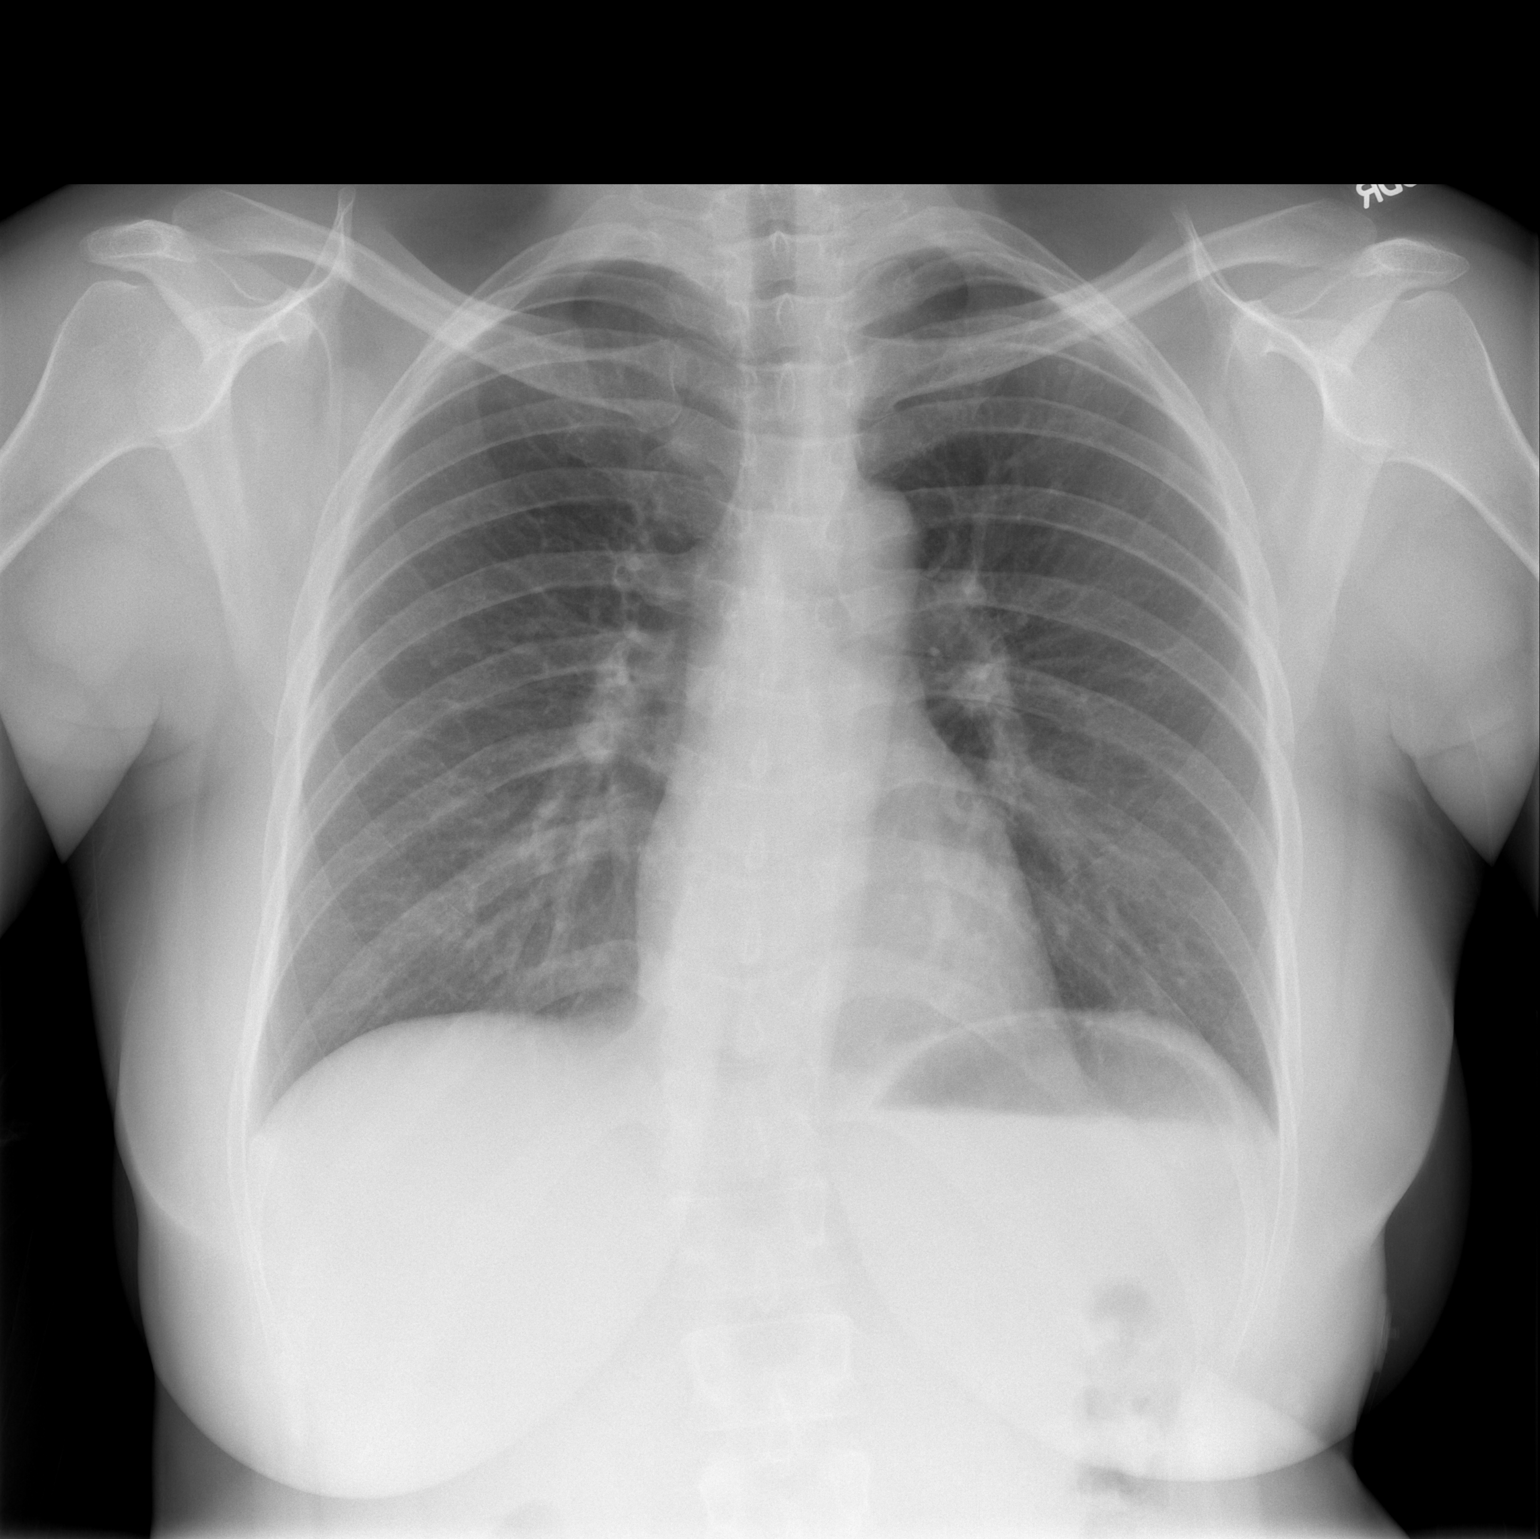

[w chest lat]
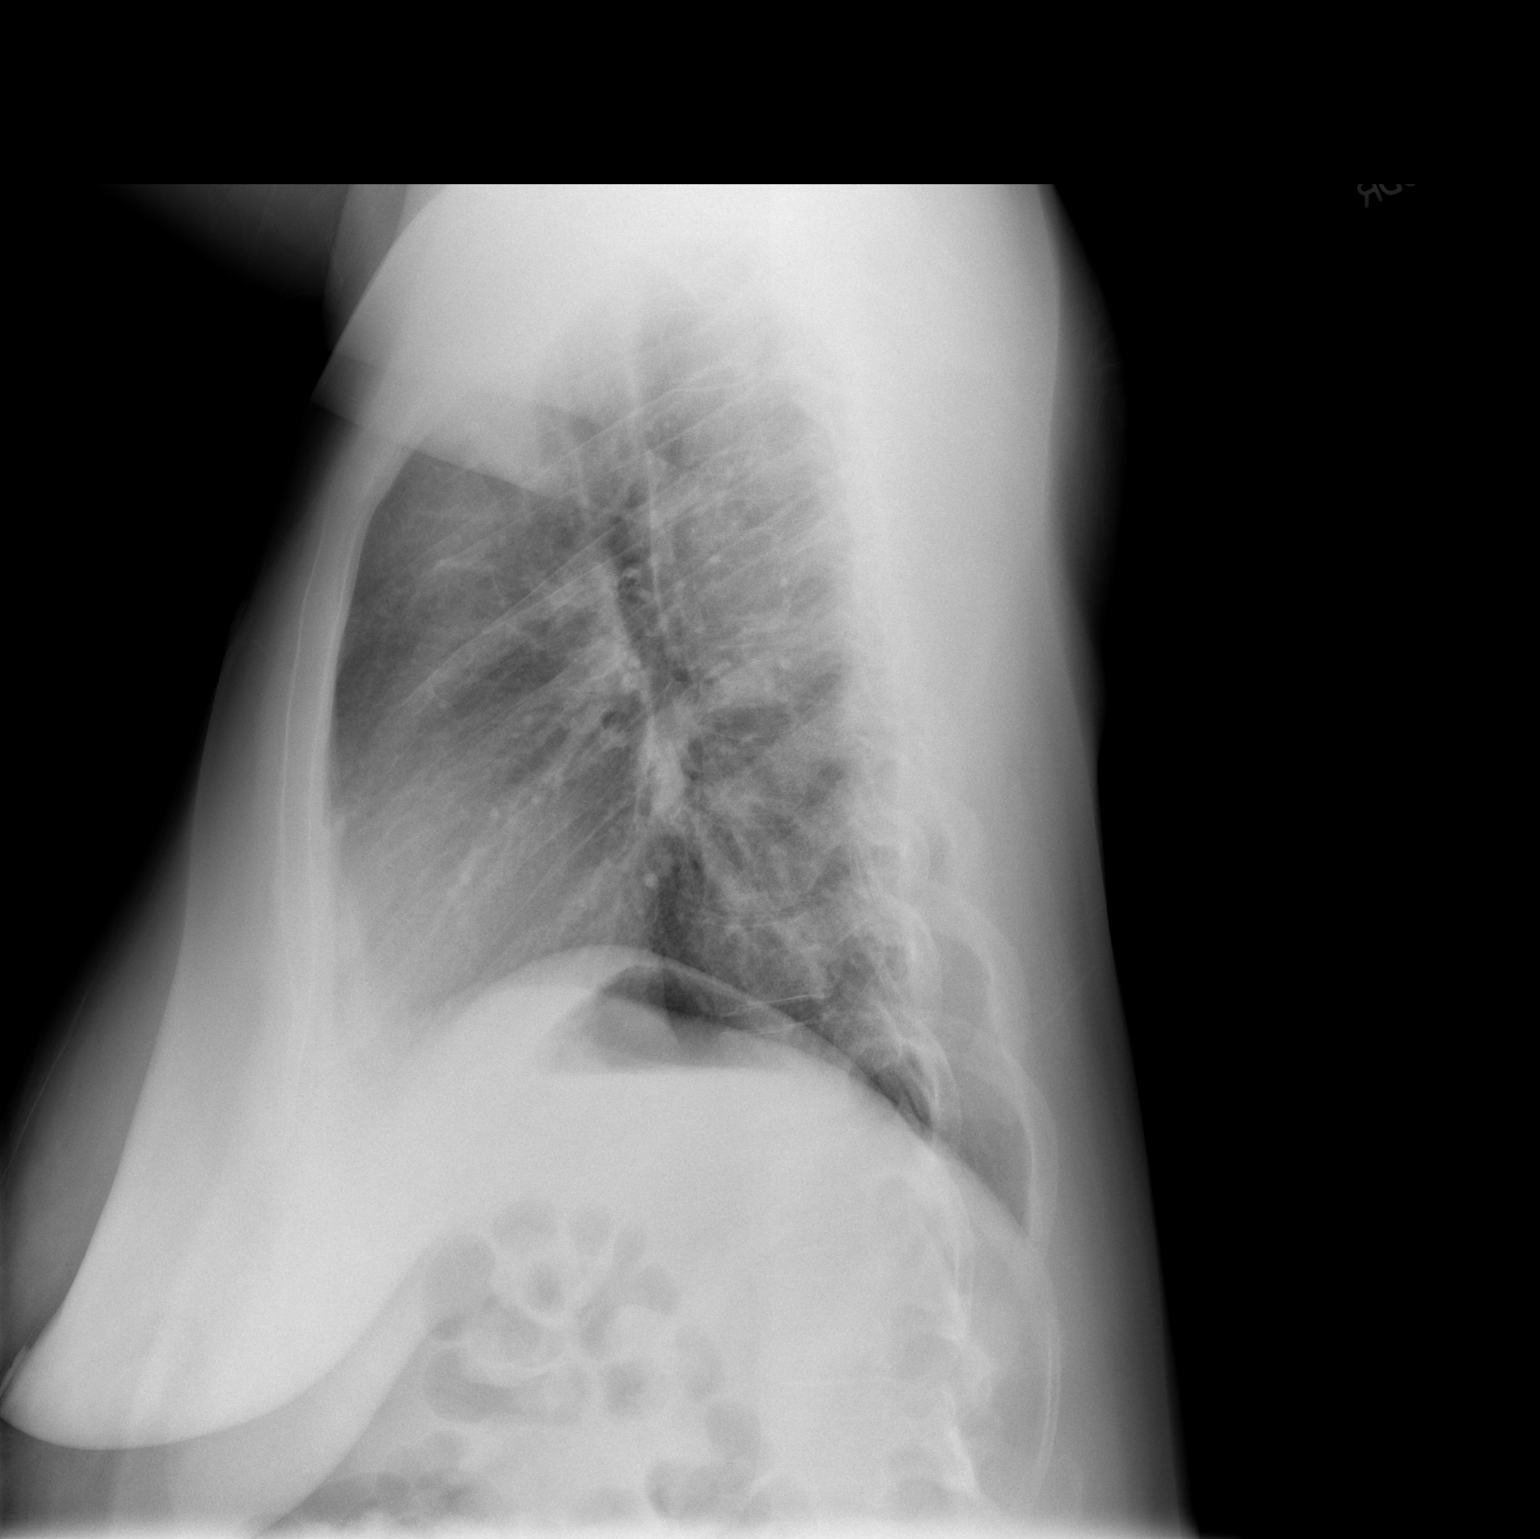

[2 of 2 positions shown; findings below may reference images not displayed]

FINDINGS: Normal heart size, mediastinal contours, and pulmonary vascularity.

Minimal peribronchial thickening.

No pulmonary infiltrate, pleural effusion, or pneumothorax.

Bones unremarkable.
IMPRESSION: Minimal bronchitic changes without infiltrate.

## 2019-01-24 ENCOUNTER — Other Ambulatory Visit: Payer: Self-pay | Admitting: Family Medicine

## 2019-01-24 DIAGNOSIS — K219 Gastro-esophageal reflux disease without esophagitis: Secondary | ICD-10-CM

## 2019-01-24 MED ORDER — ESOMEPRAZOLE MAGNESIUM 20 MG PO CPDR: 20.0000 mg | DELAYED_RELEASE_CAPSULE | Freq: Two times a day (BID) | ORAL | 5 refills | Status: DC

## 2019-01-24 NOTE — Telephone Encounter (Signed)
Rx sent 

## 2019-01-24 NOTE — Telephone Encounter (Signed)
Medication Refill - Medication: esomeprazole (NEXIUM) 20 MG capsule   Has the patient contacted their pharmacy? Yes.   (Agent: If no, request that the patient contact the pharmacy for the refill.) (Agent: If yes, when and what did the pharmacy advise?) Pharmacy needs new Rx. Pt has changed insurance   Preferred Pharmacy (with phone number or street name):  [id number] - [address], [place] [phone] (Phone) [phone] (Fax)     Agent: Please be advised that RX refills may take up to 3 business days. We ask that you follow-up with your pharmacy.

## 2019-01-25 ENCOUNTER — Telehealth: Payer: Self-pay

## 2019-01-25 MED ORDER — PANTOPRAZOLE SODIUM 40 MG PO TBEC: 40.0000 mg | DELAYED_RELEASE_TABLET | Freq: Every day | ORAL | 3 refills | Status: DC

## 2019-01-25 NOTE — Telephone Encounter (Signed)
Due to insurance issue, will change Nexium to Protonix. Plz let pt know we are making change and why. Ty.

## 2019-01-25 NOTE — Telephone Encounter (Signed)
Spoke w/ Pt- informed of below. Pt verbalized understanding.  

## 2019-01-25 NOTE — Telephone Encounter (Signed)
PA denied. Not a covered benefit/medication, no alternatives given.

## 2019-01-25 NOTE — Telephone Encounter (Signed)
PA initiated via Covermymeds; KEY: [id number]. Awaiting determination.

## 2019-01-28 ENCOUNTER — Ambulatory Visit: Payer: No Typology Code available for payment source | Admitting: Family Medicine

## 2019-01-28 ENCOUNTER — Other Ambulatory Visit: Payer: Self-pay

## 2019-01-28 ENCOUNTER — Encounter: Payer: Self-pay | Admitting: Family Medicine

## 2019-01-28 VITALS — BP 118/68 | HR 103 | Temp 99.0°F | Ht 67.0 in | Wt 192.0 lb

## 2019-01-28 DIAGNOSIS — R21 Rash and other nonspecific skin eruption: Secondary | ICD-10-CM | POA: Diagnosis not present

## 2019-01-28 MED ORDER — CLOTRIMAZOLE-BETAMETHASONE 1-0.05 % EX CREA: 1.0000 "application " | TOPICAL_CREAM | Freq: Two times a day (BID) | CUTANEOUS | 0 refills | Status: DC

## 2019-01-28 MED ORDER — METHYLPREDNISOLONE ACETATE 80 MG/ML IJ SUSP
80.0000 mg | Freq: Once | INTRAMUSCULAR | Status: AC
  Administered 2019-01-28: 80 mg via INTRAMUSCULAR

## 2019-01-28 NOTE — Progress Notes (Signed)
Chief Complaint  Patient presents with  . Rash    neck and arms    April Ho is a 43 y.o. female here for a skin complaint.  Duration: 2 months Location: neck and arms Pruritic? Yes Painful? No Drainage? No New soaps/lotions/topicals/detergents? No Sick contacts? No Other associated symptoms: scaling Therapies tried thus far: hydrocortisone cream that was helpful  ROS:  Const: No fevers Skin: As noted in HPI  Past Medical History:  Diagnosis Date  . Genital warts     BP 118/68 (BP Location: Left Arm, Patient Position: Sitting, Cuff Size: Normal)   Pulse (!) 103   Temp 99 F (37.2 C) (Oral)   Ht 5\' 7"  (1.702 m)   Wt 192 lb (87.1 kg)   SpO2 96%   BMI 30.07 kg/m  Gen: awake, alert, appearing stated age Lungs: No accessory muscle use Skin: multiple patches and plaques, scaly and flesh-colored over UE's and around neck. No drainage, erythema, TTP, fluctuance, excoriation Psych: Age appropriate judgment and insight  Scaly patch rash - Plan: methylPREDNISolone acetate (DEPO-MEDROL) injection 80 mg, Lotrisone bid for 10 d, reck then and may biopsy if no better. Avoid scented products. The patient voiced understanding and agreement to the plan.  [place], DO 01/28/19 4:42 PM

## 2019-01-28 NOTE — Patient Instructions (Signed)
Cancel appointment if you are doing better.  Let me know if medicine is too expensive.  No scented topical products.   Let us know if you need anything.

## 2019-01-29 ENCOUNTER — Ambulatory Visit (INDEPENDENT_AMBULATORY_CARE_PROVIDER_SITE_OTHER): Payer: No Typology Code available for payment source | Admitting: Family Medicine

## 2019-01-29 ENCOUNTER — Encounter: Payer: Self-pay | Admitting: Family Medicine

## 2019-01-29 ENCOUNTER — Ambulatory Visit: Payer: Self-pay

## 2019-01-29 DIAGNOSIS — T783XXA Angioneurotic edema, initial encounter: Secondary | ICD-10-CM

## 2019-01-29 MED ORDER — FAMOTIDINE 20 MG PO TABS: 20.0000 mg | ORAL_TABLET | Freq: Two times a day (BID) | ORAL | 0 refills | Status: DC

## 2019-01-29 MED ORDER — PREDNISONE 20 MG PO TABS: 40.0000 mg | ORAL_TABLET | Freq: Every day | ORAL | 0 refills | Status: AC

## 2019-01-29 MED ORDER — EPINEPHRINE 0.3 MG/0.3ML IJ SOAJ: 0.3000 mg | INTRAMUSCULAR | 1 refills | Status: AC | PRN

## 2019-01-29 MED ORDER — LEVOCETIRIZINE DIHYDROCHLORIDE 5 MG PO TABS: 5.0000 mg | ORAL_TABLET | Freq: Every evening | ORAL | 2 refills | Status: DC

## 2019-01-29 NOTE — Progress Notes (Signed)
CC: Allergic rxn  April Ho is a 43 y.o. female here for an allergic reaction. Due to COVID-19 pandemic, we are interacting via web portal for an electronic face-to-face visit. I verified patient's ID using 2 identifiers. Patient agreed to proceed with visit via this method. Patient is in parked car, I am at office. Patient and I are present for visit.   Duration: 1 d  Swelling of lip.  Any new medications, lotions, soaps, topicals or detergents? No ACEi/ARB/Estrogen? No Hx of allergic rxn/angioedema/anaphylaxis? Yes she specifically denies shortness of breath, tongue swelling, or swelling in the throat.  ROS Allergic: As noted in HPI Pulmonary: No SOB MSK: No masses  Past Medical History:  Diagnosis Date  . Genital warts     Family History  Problem Relation Age of Onset  . Cancer Neg Hx    Exam No conversational dyspnea Age appropriate judgment and insight Nml affect and mood  Angioedema of lips, initial encounter - Plan: Ambulatory referral to Allergy, predniSONE (DELTASONE) 20 MG tablet, levocetirizine (XYZAL) 5 MG tablet, famotidine (PEPCID) 20 MG tablet, EPINEPHrine 0.3 mg/0.3 mL IJ SOAJ injection  Offered allergy testing, allergy specialist referral, daily antihistamine.  Pt informed to seek emergent care if starting to experience SOB, swelling with tongue or airway/neck.  F/u in 2 weeks if symptoms do not resolve. The patient voiced understanding and agreement to the plan.  [place], DO 01/29/19 11:11 AM

## 2019-01-29 NOTE — Telephone Encounter (Signed)
Virtual visit 

## 2019-01-29 NOTE — Telephone Encounter (Signed)
Patient called and says around 0900 this morning at work, she went on break and ate white chocolate pretzels and drank a grape soda. She says she wears a mask at work and noticed her bottom lip was itching/tingling. She says she went to the bathroom and notice it was swollen. She says the more it tingles the larger it would get. She says it's pretty swollen and she has bigger lips anyway. She says the swelling is just the bottom lip, no pain, no itching. I asked is her throat itchy, does she have breathing difficulty, she denies both. She denies any other symptoms. I called the office and spoke to [place], [facility] who asks to speak to the patient, the call was connected successfully.  Answer Assessment - Initial Assessment Questions 1. ONSET: "When did the swelling start?" (e.g., minutes, hours, days)     Noticed bottom lip tingling and now it's swollen 2. SEVERITY: "How swollen is it?"     Pretty big 3. ITCHING: "Is there any itching?" If so, ask: "How much?"   (Scale 1-10; mild, moderate or severe)     No itching 4. PAIN: "Is the swelling painful to touch?" If so, ask: "How painful is it?"   (Scale 1-10; mild, moderate or severe)     No pain, tingles 5. CAUSE: "What do you think is causing the lip swelling?"     I don't know 6. RECURRENT SYMPTOM: "Have you had lip swelling before?" If so, ask: "When was the last time?" "What happened that time?"     Yes 7. OTHER SYMPTOMS: "Do you have any other symptoms?" (e.g., toothache)     No 8. PREGNANCY: "Is there any chance you are pregnant?" "When was your last menstrual period?"     No  Protocols used: LIP [facility]

## 2019-02-07 ENCOUNTER — Encounter: Payer: Self-pay | Admitting: Allergy & Immunology

## 2019-02-07 ENCOUNTER — Other Ambulatory Visit: Payer: Self-pay

## 2019-02-07 ENCOUNTER — Ambulatory Visit (INDEPENDENT_AMBULATORY_CARE_PROVIDER_SITE_OTHER): Payer: No Typology Code available for payment source | Admitting: Allergy & Immunology

## 2019-02-07 ENCOUNTER — Encounter: Payer: Self-pay | Admitting: Allergy and Immunology

## 2019-02-07 VITALS — HR 96 | Temp 97.2°F | Resp 18 | Ht 66.73 in | Wt 194.4 lb

## 2019-02-07 DIAGNOSIS — R21 Rash and other nonspecific skin eruption: Secondary | ICD-10-CM | POA: Diagnosis not present

## 2019-02-07 DIAGNOSIS — J302 Other seasonal allergic rhinitis: Secondary | ICD-10-CM

## 2019-02-07 DIAGNOSIS — J3089 Other allergic rhinitis: Secondary | ICD-10-CM | POA: Diagnosis not present

## 2019-02-07 MED ORDER — TRIAMCINOLONE ACETONIDE 0.5 % EX OINT: 1.0000 "application " | TOPICAL_OINTMENT | Freq: Two times a day (BID) | CUTANEOUS | 5 refills | Status: DC

## 2019-02-07 MED ORDER — CETIRIZINE HCL 10 MG PO TABS: 10.0000 mg | ORAL_TABLET | Freq: Two times a day (BID) | ORAL | 5 refills | Status: DC

## 2019-02-07 NOTE — Progress Notes (Signed)
NEW PATIENT  Date of Service/Encounter:  02/07/19  Referring provider: Shelda Pal, DO   Assessment:   Seasonal and perennial allergic rhinitis (dust mites, cat and dog)  Rash - ? eczema   Plan/Recommendations:   1. Chronic rhinitis - Testing today showed: dust mites, cat and dog - Copy of test results provided.  - Avoidance measures provided. - Start taking: Zyrtec (cetirizine) 10mg  twice daily - You can use an extra dose of the antihistamine, if needed, for breakthrough symptoms.  - Consider nasal saline rinses 1-2 times daily to remove allergens from the nasal cavities as well as help with mucous clearance (this is especially helpful to do before the nasal sprays are given)  2. Rash - I am unsure what the rash is, however, try getting dust mite covers to see if this makes a difference. - I would recommend increasing the potency of the steroid ointment to triamcinolone 0.5% ointment twice daily. - I would also recommend starting suppressive dosing of antihistamines: Zyrtec (cetirizine) 10mg  twice daily - We will talk again in one month to see how the rash is going. - We can send you to Dermatology if needed.  3. Return in about 4 weeks (around 03/07/2019). This can be an in-person, a virtual Webex or a telephone follow up visit.    Subjective:   April Ho is a 43 y.o. female presenting today for evaluation of  Chief Complaint  Patient presents with  . Rash    April Ho has a history of the following: Patient Active Problem List   Diagnosis Date Noted  . Genital warts 03/22/2018    History obtained from: chart review and patient.  April Ho was referred by Shelda Pal, DO.     April Ho is a 43 y.o. female presenting for an evaluation of a rash.  Patient reports a 69-month history of lip swelling associated with days lesions and tingling on her arm.  They are very pruritic.  There is been no pus or other discharge.  She has been  scratching with subsequent bleeding, which has less apartment marks.  She denies any kind of insect bite, but she says this can certainly be the case.  She does not think it is associated with foods.  Apparently, it only happens she denies any double mold.  She has tried clotrimazole without improvement.  She has been given steroid shots on a couple of occasions and the rash resolves for 2 to 3 days recurs.  He has been placed on Xyzal without improvement.  She has been on Allegra in the past as well. She also was prescribed an EpiPen.  She has never seen a dermatologist.  She denies any history of previous eczema or urticaial lesions.  She denies any history of rhinitis, although judging from her physical exam she certainly does have some postnasal drip.  She does have a history of GERD and has been on Protonix for about 1 month.  She has no history of asthma.   Otherwise, there is no history of other atopic diseases, including asthma, drug allergies, stinging insect allergies, eczema, urticaria or contact dermatitis. There is no significant infectious history. Vaccinations are up to date.    Past Medical History: Patient Active Problem List   Diagnosis Date Noted  . Genital warts 03/22/2018    Medication List:  Allergies as of 02/07/2019   No Known Allergies     Medication List       Accurate as of February 07, 2019 11:59 PM. If you have any questions, ask your nurse or doctor.        cetirizine 10 MG tablet Commonly known as: ZYRTEC Take 1 tablet (10 mg total) by mouth 2 (two) times daily. Started by: Valentina Shaggy, MD   clotrimazole-betamethasone cream Commonly known as: LOTRISONE Apply 1 application topically 2 (two) times daily.   EPINEPHrine 0.3 mg/0.3 mL Soaj injection Commonly known as: EPI-PEN Inject 0.3 mLs (0.3 mg total) into the muscle as needed for anaphylaxis.   famotidine 20 MG tablet Commonly known as: Pepcid Take 1 tablet (20 mg total) by mouth 2 (two) times  daily.   imiquimod 5 % cream Commonly known as: Aldara Apply topically 3 (three) times a week.   levocetirizine 5 MG tablet Commonly known as: XYZAL Take 1 tablet (5 mg total) by mouth every evening.   naproxen 500 MG EC tablet Commonly known as: Naproxen DR TAKE 1 TABLET(500 MG) BY MOUTH TWICE DAILY WITH A MEAL   pantoprazole 40 MG tablet Commonly known as: PROTONIX Take 1 tablet (40 mg total) by mouth daily.   triamcinolone ointment 0.5 % Commonly known as: KENALOG Apply 1 application topically 2 (two) times daily. Started by: Valentina Shaggy, MD       Birth History: non-contributory  Developmental History: non-contributory  Past Surgical History: Past Surgical History:  Procedure Laterality Date  . CESAREAN SECTION  03/25/1998  . NO PAST SURGERIES       Family History: Family History  Problem Relation Age of Onset  . Asthma Paternal Grandmother   . Cancer Neg Hx      Social History: April Ho lives at home with her family.  They live in a house that is 41 years old.  There is no visible water or mildew.  There is wood throughout the home.  They have gas heating and window units for cooling.  There is a dog inside of the home.  There are no dust mite covers on the bedding.  There is no tobacco exposure.  Currently works at [employer].   Review of Systems  Constitutional: Negative.  Negative for chills, fever, malaise/fatigue and weight loss.  HENT: Negative.  Negative for congestion, ear discharge, ear pain, sinus pain and sore throat.   Eyes: Negative for pain, discharge and redness.  Respiratory: Negative for cough, sputum production, shortness of breath and wheezing.   Cardiovascular: Negative.  Negative for chest pain and palpitations.  Gastrointestinal: Negative for abdominal pain, constipation, diarrhea, heartburn, nausea and vomiting.  Skin: Positive for itching and rash.  Neurological: Negative for dizziness and headaches.  Endo/Heme/Allergies:  Negative for environmental allergies. Does not bruise/bleed easily.       Objective:   Pulse 96, temperature (!) 97.2 F (36.2 C), resp. rate 18, height 5' 6.73" (1.695 m), weight 194 lb 6.4 oz (88.2 kg), SpO2 98 %. Body mass index is 30.69 kg/m.   Physical Exam:   Physical Exam  Constitutional: She appears well-developed.  HENT:  Head: Normocephalic and atraumatic.  Right Ear: Tympanic membrane, external ear and ear canal normal. No drainage, swelling or tenderness. Tympanic membrane is not injected, not scarred, not erythematous, not retracted and not bulging.  Left Ear: Tympanic membrane, external ear and ear canal normal. No drainage, swelling or tenderness. Tympanic membrane is not injected, not scarred, not erythematous, not retracted and not bulging.  Nose: Mucosal edema and rhinorrhea present. No nasal deformity or septal deviation. No epistaxis. Right sinus exhibits no maxillary  sinus tenderness and no frontal sinus tenderness. Left sinus exhibits no maxillary sinus tenderness and no frontal sinus tenderness.  Mouth/Throat: Uvula is midline and oropharynx is clear and moist. Mucous membranes are not pale and not dry.  Cobblestoning present in the posterior oropharynx.  Tonsils 2+ bilaterally without discharge.  Eyes: Pupils are equal, round, and reactive to light. Conjunctivae and EOM are normal. Right eye exhibits no chemosis and no discharge. Left eye exhibits no chemosis and no discharge. Right conjunctiva is not injected. Left conjunctiva is not injected.  Cardiovascular: Normal rate, regular rhythm and normal heart sounds.  Respiratory: Effort normal and breath sounds normal. No accessory muscle usage. No tachypnea. No respiratory distress. She has no wheezes. She has no rhonchi. She has no rales. She exhibits no tenderness.  GI: There is no abdominal tenderness. There is no rebound and no guarding.  Lymphadenopathy:       Head (right side): No submandibular, no tonsillar  and no occipital adenopathy present.       Head (left side): No submandibular, no tonsillar and no occipital adenopathy present.    She has no cervical adenopathy.  Neurological: She is alert.  Skin: No abrasion, no petechiae and no rash noted. Rash is not papular, not vesicular and not urticarial. No erythema. No pallor.  There are multiple hypopigmented lesions over the bilateral arms.  There are some areas that seem sandpapery in appearance.  These are circular to amorphous in shape.  Psychiatric: She has a normal mood and affect.     Diagnostic studies:     Allergy Studies:    Airborne Adult Perc - 02/07/19 1509    Time Antigen Placed  1509    Allergen Manufacturer  Lavella Hammock    Location  Back    Number of Test  59    Panel 1  Select    1. Control-Buffer 50% Glycerol  Negative    2. Control-Histamine 1 mg/ml  2+    3. Albumin saline  Negative    4. [place]eria [place]  Negative    5. [place]  Negative    6. Johnson  Negative    7. [place] Blue  Negative    8. Meadow Fescue  Negative    9. Perennial Rye  Negative    10. Sweet Vernal  Negative    11. Timothy  Negative    12. Cocklebur  Negative    13. Burweed Marshelder  Negative    14. Ragweed, short  Negative    15. Ragweed, Giant  Negative    16. Plantain,  English  Negative    17. Lamb's Quarters  Negative    18. Sheep Sorrell  Negative    19. Rough Pigweed  Negative    20. Marsh Elder, Rough  Negative    21. Mugwort, Common  Negative    22. Ash mix  Negative    23. Birch mix  Negative    24. Beech American  Negative    25. Box, Elder  Negative    26. Cedar, red  Negative    27. Cottonwood, [place]  Negative    28. Elm mix  Negative    29. Hickory mix  Negative    30. Maple mix  Negative    31. Oak, [place] mix  Negative    32. Pecan Pollen  Negative    33. Pine mix  Negative    34. Sycamore Eastern  Negative    35. Walnut, Black Pollen  Negative  36. Alternaria alternata  Negative    37. Cladosporium Herbarum  Negative     38. Aspergillus mix  Negative    39. Penicillium mix  Negative    40. Bipolaris sorokiniana (Helminthosporium)  Negative    41. Drechslera spicifera (Curvularia)  Negative    42. Mucor plumbeus  Negative    43. Fusarium moniliforme  Negative    44. Aureobasidium pullulans (pullulara)  Negative    45. Rhizopus oryzae  Negative    46. Botrytis cinera  Negative    47. Epicoccum nigrum  Negative    48. Phoma betae  Negative    49. Candida Albicans  Negative    50. Trichophyton mentagrophytes  Negative    51. Mite, D Farinae  5,000 AU/ml  4+    52. Mite, D Pteronyssinus  5,000 AU/ml  4+    53. Cat Hair 10,000 BAU/ml  2+    54.  Dog Epithelia  2+    55. Mixed Feathers  Negative    56. Horse Epithelia  Negative    57. Cockroach, German  Negative    58. Mouse  Negative    59. Tobacco Leaf  Negative     Food Perc - 02/07/19 1509    Time Antigen Placed  1509    Allergen Manufacturer  Lavella Hammock    Location  Back    Number of allergen test  10    Food  Select    1. Peanut  Negative    2. Soybean food  Negative    3. Wheat, whole  Negative    4. Sesame  Negative    5. Milk, cow  Negative    6. Egg White, chicken  Negative    7. Casein  Negative    8. Shellfish mix  Negative    9. Fish mix  Negative    10. Cashew  Negative       Allergy testing results were read and interpreted by myself, documented by clinical staff.         Salvatore Marvel, MD [facility] [place]

## 2019-02-07 NOTE — Patient Instructions (Addendum)
1. Chronic rhinitis - Testing today showed: dust mites, cat and dog - Copy of test results provided.  - Avoidance measures provided. - Start taking: Zyrtec (cetirizine) 10mg  twice daily - You can use an extra dose of the antihistamine, if needed, for breakthrough symptoms.  - Consider nasal saline rinses 1-2 times daily to remove allergens from the nasal cavities as well as help with mucous clearance (this is especially helpful to do before the nasal sprays are given)  2. Rash - I am unsure what the rash is, however, try getting dust mite covers to see if this makes a difference. - I would recommend increasing the potency of the steroid ointment to triamcinolone 0.5% ointment twice daily. - I would also recommend starting suppressive dosing of antihistamines: Zyrtec (cetirizine) 10mg  twice daily - We will talk again in one month to see how the rash is going. - We can send you to Dermatology if needed.  3. Return in about 4 weeks (around 03/07/2019). This can be an in-person, a virtual Webex or a telephone follow up visit.   Please inform us of any Emergency Department visits, hospitalizations, or changes in symptoms. Call us before going to the ED for breathing or allergy symptoms since we might be able to fit you in for a sick visit. Feel free to contact us anytime with any questions, problems, or concerns.  It was a pleasure to meet you today!  Websites that have reliable patient information: 1. American Academy of Asthma, Allergy, and Immunology: www.aaaai.org 2. Food Allergy Research and Education (FARE): foodallergy.org 3. Mothers of Asthmatics: http://www.asthmacommunitynetwork.org 4. American College of Allergy, Asthma, and Immunology: www.acaai.org  "Like" [place] on Facebook and Instagram for our latest updates!      Make sure you are registered to vote! If you have moved or changed any of your contact information, you will need to get this updated before voting!  In some cases,  you MAY be able to register to vote online: [email or web address].[email or web address]    Voter ID laws are NOT going into effect for the General Election in November 2020! DO NOT let this stop you from exercising your right to vote!   Absentee voting is the SAFEST way to vote during the coronavirus pandemic!   Download and print an absentee ballot request form at [email or web address] or you can scan the QR code below with your smart phone:      More information on absentee ballots can be found here: [email or web address]-[email or web address]   Control of House Dust Mite Allergen    House dust mites play a major role in allergic asthma and rhinitis.  They occur in environments with high humidity wherever human skin, the food for dust mites is found. High levels have been detected in dust obtained from mattresses, pillows, carpets, upholstered furniture, bed covers, clothes and soft toys.  The principal allergen of the house dust mite is found in its feces.  A gram of dust may contain 1,000 mites and 250,000 fecal particles.  Mite antigen is easily measured in the air during house cleaning activities.    1. Encase mattresses, including the box spring, and pillow, in an air tight cover.  Seal the zipper end of the encased mattresses with wide adhesive tape. 2. Wash the bedding in water of 130 degrees Farenheit weekly.  Avoid cotton comforters/quilts and flannel bedding: the most ideal bed covering is the dacron comforter. 3. Remove all upholstered furniture from the bedroom. 4. Remove carpets, carpet padding, rugs, and non-washable window drapes  from the bedroom.  Wash drapes weekly or use plastic window coverings. 5. Remove all non-washable stuffed toys from the bedroom.  Wash stuffed toys weekly. 6. Have the room cleaned frequently with a vacuum cleaner and a damp dust-mop.  The patient should not be in a room which is being cleaned and should wait 1 hour after cleaning before going  into the room. 7. Close and seal all heating outlets in the bedroom.  Otherwise, the room will become filled with dust-laden air.  An electric heater can be used to heat the room. 8. Reduce indoor humidity to less than 50%.  Do not use a humidifier.  Control of Dog or Cat Allergen  Avoidance is the best way to manage a dog or cat allergy. If you have a dog or cat and are allergic to dog or cats, consider removing the dog or cat from the home. If you have a dog or cat but don't want to find it a new home, or if your family wants a pet even though someone in the household is allergic, here are some strategies that may help keep symptoms at bay:  1. Keep the pet out of your bedroom and restrict it to only a few rooms. Be advised that keeping the dog or cat in only one room will not limit the allergens to that room. 2. Don't pet, hug or kiss the dog or cat; if you do, wash your hands with soap and water. 3. High-efficiency particulate air (HEPA) cleaners run continuously in a bedroom or living room can reduce allergen levels over time. 4. Regular use of a high-efficiency vacuum cleaner or a central vacuum can reduce allergen levels. 5. Giving your dog or cat a bath at least once a week can reduce airborne allergen.

## 2019-02-11 ENCOUNTER — Encounter: Payer: Self-pay | Admitting: Family Medicine

## 2019-02-11 ENCOUNTER — Other Ambulatory Visit: Payer: Self-pay

## 2019-02-11 ENCOUNTER — Ambulatory Visit: Payer: No Typology Code available for payment source | Admitting: Family Medicine

## 2019-02-11 VITALS — BP 120/78 | HR 117 | Temp 99.5°F | Ht 66.0 in | Wt 190.0 lb

## 2019-02-11 DIAGNOSIS — T7840XA Allergy, unspecified, initial encounter: Secondary | ICD-10-CM

## 2019-02-11 DIAGNOSIS — R21 Rash and other nonspecific skin eruption: Secondary | ICD-10-CM

## 2019-02-11 DIAGNOSIS — R1319 Other dysphagia: Secondary | ICD-10-CM

## 2019-02-11 DIAGNOSIS — R1310 Dysphagia, unspecified: Secondary | ICD-10-CM | POA: Diagnosis not present

## 2019-02-11 MED ORDER — FEXOFENADINE HCL 180 MG PO TABS: 180.0000 mg | ORAL_TABLET | Freq: Every day | ORAL | 3 refills | Status: DC

## 2019-02-11 NOTE — Patient Instructions (Signed)
Until you get a hold of your allergist, take the Allegra in the morning and the Zyrtec in the evening.   If you do not hear anything about your referral in the next 1-2 weeks, call our office and ask for an update.  Chew thoroughly. Stay hydrated.  Continue the cream, let me know if things are not getting better.  Let us know if you need anything.

## 2019-02-11 NOTE — Progress Notes (Signed)
Chief Complaint  Patient presents with  . Follow-up    April Ho is a 43 y.o. female here for a skin complaint.  Skin is doing better w cream. Will hold off on biopsy. Dosage recently increased. Ins would not cover quantity that was written.   Over past 2 d, has been having trouble swallowing. Feels food is getting stuck in upper chest area. Painful. Honey and water have been helpful. No change in diet. Usually chews food, no changes that she has noted. Hx of reflux, currently on Protonix as ins stopped covering Nexium.   ROS:  Const: No fevers Skin: As noted in HPI  Past Medical History:  Diagnosis Date  . Genital warts     BP 120/78 (BP Location: Right Arm, Patient Position: Sitting, Cuff Size: Normal)   Pulse (!) 117   Temp 99.5 F (37.5 C) (Oral)   Ht 5\' 6"  (1.676 m)   Wt 190 lb (86.2 kg)   SpO2 98%   BMI 30.67 kg/m  Gen: awake, alert, appearing stated age Lungs: No accessory muscle use Heart: RRR (around 84 bpm on my exam) GI: BS+, soft, NT, ND  Skin: nummular patches that are hypopigmented on UE's and neck, improved from last visit. No drainage, erythema, TTP, fluctuance, excoriation Psych: Age appropriate judgment and insight  Esophageal dysphagia - Plan: Ambulatory referral to Gastroenterology, likely needs EGD  Scaly patch rash - Plan: cont topical cream, standing offer for derm provided.  Allergic reaction, initial encounter - Plan: fexofenadine (ALLEGRA) 180 MG tablet, add AM Allegra to nighttime Zyrtec, let allergy team know.   Orders as above. F/u prn. The patient voiced understanding and agreement to the plan.  [place], DO 02/11/19 4:42 PM

## 2019-02-15 ENCOUNTER — Telehealth: Payer: Self-pay | Admitting: Allergy & Immunology

## 2019-02-15 ENCOUNTER — Ambulatory Visit: Payer: Self-pay | Admitting: Family Medicine

## 2019-02-15 NOTE — Telephone Encounter (Signed)
Review note.  I would recommend that she changed to Allegra twice daily instead.  We have some samples if she wants to pick some up.  Salvatore Marvel, MD Allergy and [place] [facility]

## 2019-02-15 NOTE — Telephone Encounter (Signed)
PT called to report that zyrtec is making her very sleepy. States bottle is dated 02/07/2019 ordered by Dr Ernst Bowler. Epic says Dr Nani Ravens ordered the cetirizine.   Needs alternative medicine.   April Ho [phone]

## 2019-02-15 NOTE — Telephone Encounter (Signed)
She has appt. Already and told to call GI Monday to call for cancellation to move appt up.April KitchenMarland KitchenMarland Ho

## 2019-02-15 NOTE — Telephone Encounter (Signed)
Please advise 

## 2019-02-15 NOTE — Telephone Encounter (Signed)
Please give pt info for [facility] as the referral has been placed and they tried to reach her, but could not. Ty.

## 2019-02-15 NOTE — Telephone Encounter (Signed)
Pt. Reports she is still having food feeling like it stuck in her upper chest. Honey and warm liquids are not helping. Wants to know if there is anything else she can try. Please advise pt.

## 2019-02-15 NOTE — Telephone Encounter (Signed)
Spoke to pt and informed her of the recommendation to change to allegra and we have samples if she needed any.

## 2019-02-20 ENCOUNTER — Ambulatory Visit: Payer: Self-pay | Admitting: *Deleted

## 2019-02-20 NOTE — Telephone Encounter (Signed)
Summary: cough, phlegm    Pt is calling because she is coughing And has yellow phlegm. She is asking for advice  Please call back  Thanks      Patient has endoscope scheduled for 7/22. Patient is afraid she may have infection because she is having yellow phlegm/congestion.Patient has had a swallowing issue that she is having the procedure for. Patient states the reflux medications are not helping- she can eat a couple bites on food and starts burping. Patient has increasing hoarseness too.  Call to office- request send note- Dr Nani Ravens is not in office tomorrow- she may need appointment to be seen to see if there is something else to try until the appointment. Call to office for appointment- [facility] states patient can be seen by another provider tomorrow or Dr Nani Ravens on Friday. Reason for Disposition . [1] Symptoms of pill stuck in throat or esophagus (e.g., pain in throat or chest, FB sensation) AND [2] no relief after using CARE ADVICE  Answer Assessment - Initial Assessment Questions 1. ONSET: "When did the cough begin?"      2 weeks- only coughs to get congestion up when eats 2. SEVERITY: "How bad is the cough today?"      Not bad- only cough to move congestion- mostly after eats/drinks- no appetite  3. RESPIRATORY DISTRESS: "Describe your breathing."      No problems with breathing 4. FEVER: "Do you have a fever?" If so, ask: "What is your temperature, how was it measured, and when did it start?"     no 5. SPUTUM: "Describe the color of your sputum" (e.g., clear, white, yellow, green), "Has there been any change recently?"     Yellow in color 6. HEMOPTYSIS: "Are you coughing up any blood?" If so ask: "How much, flecks, streaks, tablespoons, etc.?"     No blood 7. CARDIAC HISTORY: "Do you have any history of heart disease?" (e.g., heart attack, congestive heart failure)      no 8. LUNG HISTORY: "Do you have any history of lung disease?"  (e.g., pulmonary embolus, asthma,  emphysema/COPD)     no 9. OTHER SYMPTOMS: "Do you have any other symptoms? (e.g., runny nose, wheezing, chest pain)     Chest pain when swallowing and moving congestion 10. PREGNANCY: "Is there any chance you are pregnant?" "When was your last menstrual period?"        not active 11. TRAVEL: "Have you traveled out of the country in the last month?" (e.g., travel history, exposures)       No travel or exposure  Answer Assessment - Initial Assessment Questions 1. SYMPTOM: "Are you having difficulty swallowing liquids, solids, or both?"     Solids, pills- has to be real small 2. ONSET: "When did the swallowing problems begin?"      Beginning of July 3. CAUSE: "What do you think is causing the problem?"      Acid reflux problems 4. CHRONIC/RECURRENT: "Is this a new problem for you?"  If no, ask: "How long have you had this problem?" (e.g., days, weeks, months)      Chronic- hx of reflux problem 5. OTHER SYMPTOMS: "Do you have any other symptoms?" (e.g., difficulty breathing, sore throat, swollen tongue, chest pain)     Chest pain, congestion, hoarseness- down low  6. PREGNANCY: "Is there any chance you are pregnant?" "When was your last menstrual period?"     Not active  Protocols used: SWALLOWING DIFFICULTY-A-AH, COUGH - CHRONIC-A-AH

## 2019-02-20 NOTE — Telephone Encounter (Signed)
Appt scheduled

## 2019-02-22 ENCOUNTER — Encounter: Payer: Self-pay | Admitting: Family Medicine

## 2019-02-22 ENCOUNTER — Ambulatory Visit: Payer: No Typology Code available for payment source | Admitting: Family Medicine

## 2019-02-22 ENCOUNTER — Other Ambulatory Visit: Payer: Self-pay

## 2019-02-22 VITALS — BP 108/72 | HR 96 | Temp 99.2°F | Ht 66.0 in | Wt 190.5 lb

## 2019-02-22 DIAGNOSIS — R1319 Other dysphagia: Secondary | ICD-10-CM

## 2019-02-22 DIAGNOSIS — R1310 Dysphagia, unspecified: Secondary | ICD-10-CM | POA: Diagnosis not present

## 2019-02-22 DIAGNOSIS — R05 Cough: Secondary | ICD-10-CM

## 2019-02-22 DIAGNOSIS — Z0289 Encounter for other administrative examinations: Secondary | ICD-10-CM

## 2019-02-22 DIAGNOSIS — R058 Other specified cough: Secondary | ICD-10-CM

## 2019-02-22 MED ORDER — AZITHROMYCIN 250 MG PO TABS: ORAL_TABLET | ORAL | 0 refills | Status: DC

## 2019-02-22 MED ORDER — FLUCONAZOLE 150 MG PO TABS: 150.0000 mg | ORAL_TABLET | Freq: Once | ORAL | 0 refills | Status: AC

## 2019-02-22 NOTE — Progress Notes (Signed)
Chief Complaint  Patient presents with  . Gastroesophageal Reflux    GERD April Ho is a 43 y.o. female who presents for f/u for dysphagia.  Has appt w GI next week. Pepcid and Protonix not helpful. Food feels like it gets stuck, has lost several lbs since this started, unintentionally. No fevers or bowel changes.   She is applying for a concealed carrying permit. No mental health or substance abuse history.   Duration: several weeks  Associated symptoms: productive cough Denies: sinus congestion, sinus pain, rhinorrhea, itchy watery eyes, ear pain, ear drainage, sore throat, wheezing, shortness of breath, myalgia and fevers Treatment to date: none Sick contacts: No    Past Medical History:  Diagnosis Date  . Genital warts     Review of Systems Gastrointestinal: as noted in HPI  Exam BP 108/72 (BP Location: Right Arm, Patient Position: Sitting, Cuff Size: Normal)   Pulse 96   Temp 99.2 F (37.3 C) (Oral)   Ht 5\' 6"  (1.676 m)   Wt 190 lb 8 oz (86.4 kg)   SpO2 96%   BMI 30.75 kg/m  General:  well developed, well nourished, in no apparent distress Lungs:  clear to auscultation, breath sounds equal bilaterally, no respiratory distress, no wheezes Cardio:  regular rate and rhythm without murmurs, heart sounds without clicks or rubs Abdomen:  abdomen soft, nontender; bowel sounds normal; no masses or organomegaly Psych: Well oriented with normal range of affect appropriate judgment/insight  Assessment and Plan  Productive cough - Plan: trial Zpak, this is more likely related to #2  Esophageal dysphagia - Plan: OK to stop PPI and H2 blocker, needs to see GI  Encounter for completion of form with patient - Plan: will fill out form, no contraindication to responsibly carrying weapon.  Follow up prn. Pt voiced understanding and agreement to the plan.  [place], DO 02/22/19  3:45 PM

## 2019-02-22 NOTE — Patient Instructions (Signed)
Don't miss the appointment with the gastroenterology team.  We will get the documents for your permit and let you know when to pick up.   Let us know if you need anything.

## 2019-02-25 ENCOUNTER — Ambulatory Visit (INDEPENDENT_AMBULATORY_CARE_PROVIDER_SITE_OTHER): Payer: No Typology Code available for payment source | Admitting: Family Medicine

## 2019-02-25 ENCOUNTER — Encounter: Payer: Self-pay | Admitting: Family Medicine

## 2019-02-25 ENCOUNTER — Other Ambulatory Visit: Payer: Self-pay

## 2019-02-25 DIAGNOSIS — T783XXA Angioneurotic edema, initial encounter: Secondary | ICD-10-CM

## 2019-02-25 NOTE — Progress Notes (Signed)
Chief Complaint  Patient presents with  . Allergic Reaction    April Ho is a 43 y.o. female here for an allergic reaction. Due to COVID-19 pandemic, we are interacting via web portal for an electronic face-to-face visit. I verified patient's ID using 2 identifiers. Patient agreed to proceed with visit via this method. Patient is at home, I am at office. Patient and I are present for visit.   Duration: 1 d Any new medications, lotions, soaps, topicals or detergents? No ACEi/ARB/Estrogen? No Hx of allergic rxn/angioedema/anaphylaxis? Yes- sees allergy team She specifically denies shortness of breath, tongue, swelling, or swelling in the throat. Her lip swelling resolved last night. She does take Zyrtec daily.   ROS Allergic: As noted in HPI Pulmonary: No SOB MSK: No masses  Past Medical History:  Diagnosis Date  . Genital warts     Family History  Problem Relation Age of Onset  . Asthma Paternal Grandmother   . Cancer Neg Hx    Exam No conversational dyspnea Age appropriate judgment and insight Nml affect and mood  Angioedema of lips, initial encounter - Plan: Based on her allergy testing, she is likely being exposed to dust mites through roommates carpet. Notify allergy team. Cont PO antihis. Warning signs and symptoms verbalized.    F/u prn. Pt voiced understanding and agreement to the plan.  [place], DO 02/25/19 4:04 PM

## 2019-02-27 ENCOUNTER — Ambulatory Visit (INDEPENDENT_AMBULATORY_CARE_PROVIDER_SITE_OTHER): Payer: No Typology Code available for payment source | Admitting: Physician Assistant

## 2019-02-27 ENCOUNTER — Encounter: Payer: Self-pay | Admitting: Physician Assistant

## 2019-02-27 ENCOUNTER — Encounter (INDEPENDENT_AMBULATORY_CARE_PROVIDER_SITE_OTHER): Payer: Self-pay

## 2019-02-27 VITALS — BP 102/60 | HR 52 | Temp 98.3°F | Ht 66.0 in | Wt 191.0 lb

## 2019-02-27 DIAGNOSIS — K219 Gastro-esophageal reflux disease without esophagitis: Secondary | ICD-10-CM | POA: Diagnosis not present

## 2019-02-27 DIAGNOSIS — R1310 Dysphagia, unspecified: Secondary | ICD-10-CM | POA: Diagnosis not present

## 2019-02-27 MED ORDER — PANTOPRAZOLE SODIUM 40 MG PO TBEC: 40.0000 mg | DELAYED_RELEASE_TABLET | Freq: Two times a day (BID) | ORAL | 4 refills | Status: DC

## 2019-02-27 NOTE — Progress Notes (Signed)
____________________________________________________________  Attending physician addendum:  Thank you for sending this case to me. I have reviewed the entire note, and the outlined plan seems appropriate.  She is on my endoscopy schedule for 7/23  Wilfrid Lund, MD  ____________________________________________________________

## 2019-02-27 NOTE — Progress Notes (Signed)
Subjective:    Patient ID: April Ho, female    DOB: Sep 05, 1975, 43 y.o.   MRN: [id number]  HPI April Ho is a pleasant 43 year old African-American female, generally in good health, with history of GERD who is referred today by Dr. Nani Ravens with new complaint of dysphasia. Patient has not had any prior GI evaluation. She relates that she has had chronic symptoms of heartburn for years and is currently on Protonix 40 mg p.o. daily.  She says in the past this had worked well but over the past couple of months has not been effective. About 3 weeks ago she had onset of dysphasia to solids.  She is also had some intermittent mild odynophagia.  She is not having any difficulty with liquids.  She says she is having to sleep upright because of increase in reflux at nighttime. Patient says she is does not have symptoms with every meal but very frequently and says she is generally eating 3 or 4 bites of food, develops burping and belching and then can feel the food sitting in her esophagus.  She is only had one episode requiring regurgitation.  She has stopped eating most meat and bread. Weight has been stable, appetite fine.  She has no current complaints of abdominal pain, no changes in bowel habits no melena or hematochezia. Family history is negative for GI disease as far she is aware  Review of Systems Pertinent positive and negative review of systems were noted in the above HPI section.  All other review of systems was otherwise negative.  Outpatient Encounter Medications as of 02/27/2019  Medication Sig  . cetirizine (ZYRTEC) 10 MG tablet Take 1 tablet (10 mg total) by mouth 2 (two) times daily.  . clotrimazole-betamethasone (LOTRISONE) cream Apply 1 application topically 2 (two) times daily.  Marland Kitchen EPINEPHrine 0.3 mg/0.3 mL IJ SOAJ injection Inject 0.3 mLs (0.3 mg total) into the muscle as needed for anaphylaxis.  Marland Kitchen imiquimod (ALDARA) 5 % cream Apply topically 3 (three) times a week.  . naproxen  (NAPROXEN DR) 500 MG EC tablet TAKE 1 TABLET(500 MG) BY MOUTH TWICE DAILY WITH A MEAL  . pantoprazole (PROTONIX) 40 MG tablet Take 1 tablet (40 mg total) by mouth 2 (two) times daily before a meal.  . triamcinolone ointment (KENALOG) 0.5 % Apply 1 application topically 2 (two) times daily.  . [DISCONTINUED] azithromycin (ZITHROMAX) 250 MG tablet Take 2 tabs the first day and then 1 tab daily until you run out.  . [DISCONTINUED] pantoprazole (PROTONIX) 40 MG tablet Take 1 tablet (40 mg total) by mouth daily.   No facility-administered encounter medications on file as of 02/27/2019.    No Known Allergies Patient Active Problem List   Diagnosis Date Noted  . Genital warts 03/22/2018   Social History   Socioeconomic History  . Marital status: Single    Spouse name: Not on file  . Number of children: Not on file  . Years of education: Not on file  . Highest education level: Not on file  Occupational History  . Not on file  Social Needs  . Financial resource strain: Not on file  . Food insecurity    Worry: Not on file    Inability: Not on file  . Transportation needs    Medical: Not on file    Non-medical: Not on file  Tobacco Use  . Smoking status: Never Smoker  . Smokeless tobacco: Never Used  Substance and Sexual Activity  . Alcohol use: No  Frequency: Never  . Drug use: No  . Sexual activity: Not on file  Lifestyle  . Physical activity    Days per week: Not on file    Minutes per session: Not on file  . Stress: Not on file  Relationships  . Social Herbalist on phone: Not on file    Gets together: Not on file    Attends religious service: Not on file    Active member of club or organization: Not on file    Attends meetings of clubs or organizations: Not on file    Relationship status: Not on file  . Intimate partner violence    Fear of current or ex partner: Not on file    Emotionally abused: Not on file    Physically abused: Not on file    Forced  sexual activity: Not on file  Other Topics Concern  . Not on file  Social History Narrative  . Not on file    April Ho's family history includes Asthma in her paternal grandmother; Diabetes in her father; Heart attack in her father; Hypertension in her father; Kidney failure in her father; Multiple sclerosis in her mother; Pancreatic disease in her father.      Objective:    Vitals:   02/27/19 1425  BP: 102/60  Pulse: (!) 52  Temp: 98.3 F (36.8 C)  Pulse rechecked at 84  Physical Exam Well-developed well-nourished African-American female in no acute distress.   HDQQIW,979 BMI 30.8  HEENT; nontraumatic normocephalic, EOMI, PER R LA, sclera anicteric. Oropharynx; not examined/wearing mass/COVID Neck; supple, no JVD Cardiovascular; regular rate and rhythm with S1-S2, no murmur rub or gallop Pulmonary; Clear bilaterally Abdomen; soft, nontender, nondistended, no palpable mass or hepatosplenomegaly, bowel sounds are active Rectal; not done Skin; benign exam, no jaundice rash or appreciable lesions Extremities; no clubbing cyanosis or edema skin warm and dry Neuro/Psych; alert and oriented x4, grossly nonfocal mood and affect appropriate       Assessment & Plan:   #83 43 year old African-American female with history of chronic GERD, recently poorly controlled onset of solid food dysphagia and intermittent mild odynophagia over the past 3 weeks.  Rule out peptic stricture, rule out possible neoplasm, rule out eosinophilic esophagitis, rule out motility disorder  #2 cholelithiasis documented on CT November 2019  Plan; Will increase pantoprazole to 40 mg p.o. twice daily AC breakfast and AC dinner over the next month then if symptoms improve decrease to every morning  Patient is advised to chew very carefully and avoid meats, dry breads etc. until procedure is completed.  Patient will be scheduled for upper endoscopy with possible esophageal dilation with Dr. Loletha Carrow.   Procedure was discussed in detail with the patient including indications risks and benefits and she is agreeable to proceed. Other recommendations pending results at EGD.  April Ho S April Pleitez PA-C 02/27/2019   Cc: Shelda Pal*

## 2019-02-27 NOTE — Patient Instructions (Signed)
If you are age 41 or older, your body mass index should be between 23-30. Your Body mass index is 30.83 kg/m. If this is out of the aforementioned range listed, please consider follow up with your Primary Care Provider.  If you are age 56 or younger, your body mass index should be between 19-25. Your Body mass index is 30.83 kg/m. If this is out of the aformentioned range listed, please consider follow up with your Primary Care Provider.   You have been scheduled for an endoscopy. Please follow written instructions given to you at your visit today. If you use inhalers (even only as needed), please bring them with you on the day of your procedure. Your physician has requested that you go to www.startemmi.com and enter the access code given to you at your visit today. This web site gives a general overview about your procedure. However, you should still follow specific instructions given to you by our office regarding your preparation for the procedure.  We have sent the following medications to your pharmacy for you to pick up at your convenience: Protonix twice daily   Soft diet. AVOID meats and dry bread etc. Until after procedure.  Thank you for choosing me and [facility].   Amy Esterwood, PA-C

## 2019-02-28 ENCOUNTER — Encounter: Payer: Self-pay | Admitting: Gastroenterology

## 2019-02-28 ENCOUNTER — Other Ambulatory Visit: Payer: Self-pay

## 2019-02-28 ENCOUNTER — Ambulatory Visit (AMBULATORY_SURGERY_CENTER): Payer: No Typology Code available for payment source | Admitting: Gastroenterology

## 2019-02-28 VITALS — BP 117/75 | HR 97 | Temp 99.0°F | Resp 16 | Ht 66.0 in | Wt 191.0 lb

## 2019-02-28 DIAGNOSIS — K219 Gastro-esophageal reflux disease without esophagitis: Secondary | ICD-10-CM

## 2019-02-28 DIAGNOSIS — R1310 Dysphagia, unspecified: Secondary | ICD-10-CM

## 2019-02-28 DIAGNOSIS — R1319 Other dysphagia: Secondary | ICD-10-CM

## 2019-02-28 MED ORDER — SODIUM CHLORIDE 0.9 % IV SOLN: 500.0000 mL | Freq: Once | INTRAVENOUS | Status: DC

## 2019-02-28 NOTE — Patient Instructions (Addendum)
  Perform esophageal manometry to be scheduled.    YOU HAD AN ENDOSCOPIC PROCEDURE TODAY AT [facility]:   Refer to the procedure report that was given to you for any specific questions about what was found during the examination.  If the procedure report does not answer your questions, please call your gastroenterologist to clarify.  If you requested that your care partner not be given the details of your procedure findings, then the procedure report has been included in a sealed envelope for you to review at your convenience later.  YOU SHOULD EXPECT: Some feelings of bloating in the abdomen. Passage of more gas than usual.  Walking can help get rid of the air that was put into your GI tract during the procedure and reduce the bloating. If you had a lower endoscopy (such as a colonoscopy or flexible sigmoidoscopy) you may notice spotting of blood in your stool or on the toilet paper. If you underwent a bowel prep for your procedure, you may not have a normal bowel movement for a few days.  Please Note:  You might notice some irritation and congestion in your nose or some drainage.  This is from the oxygen used during your procedure.  There is no need for concern and it should clear up in a day or so.  SYMPTOMS TO REPORT IMMEDIATELY:    Following upper endoscopy (EGD)  Vomiting of blood or coffee ground material  New chest pain or pain under the shoulder blades  Painful or persistently difficult swallowing  New shortness of breath  Fever of 100F or higher  Black, tarry-looking stools  For urgent or emergent issues, a gastroenterologist can be reached at any hour by calling [phone].   DIET:  We do recommend a small meal at first, but then you may proceed to your regular diet.  Drink plenty of fluids but you should avoid alcoholic beverages for 24 hours.  ACTIVITY:  You should plan to take it easy for the rest of today and you should NOT DRIVE or use heavy machinery  until tomorrow (because of the sedation medicines used during the test).    FOLLOW UP: Our staff will call the number listed on your records 48-72 hours following your procedure to check on you and address any questions or concerns that you may have regarding the information given to you following your procedure. If we do not reach you, we will leave a message.  We will attempt to reach you two times.  During this call, we will ask if you have developed any symptoms of COVID 19. If you develop any symptoms (ie: fever, flu-like symptoms, shortness of breath, cough etc.) before then, please call [phone].  If you test positive for Covid 19 in the 2 weeks post procedure, please call and report this information to [place].    If any biopsies were taken you will be contacted by phone or by letter within the next 1-3 weeks.  Please call us at [phone] if you have not heard about the biopsies in 3 weeks.    SIGNATURES/CONFIDENTIALITY: You and/or your care partner have signed paperwork which will be entered into your electronic medical record.  These signatures attest to the fact that that the information above on your After Visit Summary has been reviewed and is understood.  Full responsibility of the confidentiality of this discharge information lies with you and/or your care-partner.

## 2019-02-28 NOTE — Progress Notes (Signed)
Riki Sheer, LPN - temp [place], [place]

## 2019-02-28 NOTE — Progress Notes (Signed)
Report given to PACU, vss 

## 2019-02-28 NOTE — Op Note (Signed)
[name] Patient Name: April Ho Procedure Date: 02/28/2019 3:21 PM MRN: [id number] Endoscopist: Mallie Mussel L. Loletha Carrow , MD Age: 43 Referring MD:  Date of Birth: 11-14-1975 Gender: Female Account #: [id number] Procedure:                Upper GI endoscopy Indications:              Esophageal dysphagia (months), Gastro-esophageal                            reflux disease (years) Medicines:                Monitored Anesthesia Care Procedure:                Pre-Anesthesia Assessment:                           - Prior to the procedure, a History and Physical                            was performed, and patient medications and                            allergies were reviewed. The patient's tolerance of                            previous anesthesia was also reviewed. The risks                            and benefits of the procedure and the sedation                            options and risks were discussed with the patient.                            All questions were answered, and informed consent                            was obtained. Prior Anticoagulants: The patient has                            taken no previous anticoagulant or antiplatelet                            agents. ASA Grade Assessment: II - A patient with                            mild systemic disease. After reviewing the risks                            and benefits, the patient was deemed in                            satisfactory condition to undergo the procedure.  After obtaining informed consent, the endoscope was                            passed under direct vision. Throughout the                            procedure, the patient's blood pressure, pulse, and                            oxygen saturations were monitored continuously. The                            Endoscope was introduced through the mouth, and                            advanced to the second part of  duodenum. The upper                            GI endoscopy was accomplished without difficulty.                            The patient tolerated the procedure well. Scope In: Scope Out: Findings:                 The larynx was normal.                           The examined esophagus was normal.                           There is no endoscopic evidence of Barrett's                            esophagus, esophagitis, hiatal hernia, mucosal                            abnormalities or stricture in the entire esophagus.                           The stomach was normal.                           The cardia and gastric fundus were normal on                            retroflexion.                           The examined duodenum was normal. Complications:            No immediate complications. Estimated Blood Loss:     Estimated blood loss: none. Impression:               - Normal larynx.                           - Normal esophagus.                           -  Normal stomach.                           - Normal examined duodenum.                           - No specimens collected. Recommendation:           - Patient has a contact number available for                            emergencies. The signs and symptoms of potential                            delayed complications were discussed with the                            patient. Return to normal activities tomorrow.                            Written discharge instructions were provided to the                            patient.                           - Resume previous diet.                           - Continue present medications.                           - Perform esophageal manometry at appointment to be                            scheduled. [name], MD 02/28/2019 3:55:56 PM This report has been signed electronically.

## 2019-03-01 ENCOUNTER — Encounter: Payer: Self-pay | Admitting: *Deleted

## 2019-03-01 ENCOUNTER — Telehealth: Payer: Self-pay | Admitting: Gastroenterology

## 2019-03-01 ENCOUNTER — Other Ambulatory Visit: Payer: Self-pay | Admitting: *Deleted

## 2019-03-01 DIAGNOSIS — R1310 Dysphagia, unspecified: Secondary | ICD-10-CM

## 2019-03-01 DIAGNOSIS — Z1159 Encounter for screening for other viral diseases: Secondary | ICD-10-CM

## 2019-03-01 NOTE — Telephone Encounter (Signed)
Understood. If so, then please reschedule to a date that works for her.  Then she would need a clinic visit with me 3-4 weeks later to allow time for the study to be read by Dr. Silverio Decamp

## 2019-03-01 NOTE — Telephone Encounter (Signed)
FYI---Dr. Loletha Carrow, spoke with the patient about the eso mano. The patient did not know she would have to quarantine prior to the procedure and may cancel. She reported she had to check on her work schedule but may cancel.  Spoke with the patient, scheduled for esophageal manometry on 03/20/2019 at 12:30 pm. COVID screening appointment scheduled on 03/16/2019. Patient aware. Letter containing information on COVID screening and eso mano mailed.

## 2019-03-01 NOTE — Telephone Encounter (Signed)
This patient had an upper endoscopy with me yesterday for dysphagia.  Please arrange an esophageal manometry study.  We discussed that after the procedure, so she is expecting to hear from you.

## 2019-03-04 ENCOUNTER — Telehealth: Payer: Self-pay

## 2019-03-04 ENCOUNTER — Telehealth: Payer: Self-pay | Admitting: *Deleted

## 2019-03-04 NOTE — Telephone Encounter (Signed)
Second follow up phone call attempt.  No answer, message left for pt.

## 2019-03-04 NOTE — Telephone Encounter (Signed)
Message left

## 2019-03-16 ENCOUNTER — Other Ambulatory Visit (HOSPITAL_COMMUNITY): Admission: RE | Admit: 2019-03-16 | Payer: No Typology Code available for payment source | Source: Ambulatory Visit

## 2019-03-20 ENCOUNTER — Ambulatory Visit (HOSPITAL_COMMUNITY): Admit: 2019-03-20 | Payer: No Typology Code available for payment source | Admitting: Gastroenterology

## 2019-03-20 ENCOUNTER — Encounter (HOSPITAL_COMMUNITY): Payer: Self-pay

## 2019-03-20 SURGERY — MANOMETRY, ESOPHAGUS

## 2019-03-21 ENCOUNTER — Telehealth: Payer: Self-pay | Admitting: *Deleted

## 2019-03-21 NOTE — Telephone Encounter (Signed)
Called the patient, left message on VM to call the office when she is able to have the esophageal manometry and specifically the COVID screen and quarantine.   Per our last conversation, the patient reported that she thought she may cancel the eso mano due to the fact that she was unable to quarantine (no time off from work).

## 2019-04-10 ENCOUNTER — Other Ambulatory Visit: Payer: Self-pay | Admitting: Family Medicine

## 2019-04-10 DIAGNOSIS — R109 Unspecified abdominal pain: Secondary | ICD-10-CM

## 2019-04-10 MED ORDER — NAPROXEN 500 MG PO TBEC: DELAYED_RELEASE_TABLET | ORAL | 0 refills | Status: DC

## 2019-04-10 NOTE — Telephone Encounter (Signed)
Medication Refill - Medication: naproxen (NAPROXEN DR) 500 MG EC tablet [id number]   Has the patient contacted their pharmacy? No. (Agent: If no, request that the patient contact the pharmacy for the refill.) (Agent: If yes, when and what did the pharmacy advise?)  Preferred Pharmacy (with phone number or street name): [facility] [address]   Agent: Please be advised that RX refills may take up to 3 business days. We ask that you follow-up with your pharmacy.

## 2019-04-11 ENCOUNTER — Ambulatory Visit: Payer: No Typology Code available for payment source | Admitting: Allergy and Immunology

## 2019-04-30 ENCOUNTER — Telehealth: Payer: Self-pay | Admitting: Family Medicine

## 2019-04-30 NOTE — Telephone Encounter (Signed)
Copied from April Ho [id number]. Topic: Appointment Scheduling - Scheduling Inquiry for Clinic >> Apr 30, 2019  2:14 PM Rayann Heman wrote: Reason for CRM: pt called and stated that she has shoulder pain and woul like to schedule an appointment tomorrow if possible   Called and scheduled an appt for this patient tomorrow 05/01/2019 at 11:15

## 2019-05-01 ENCOUNTER — Ambulatory Visit: Payer: No Typology Code available for payment source | Admitting: Family Medicine

## 2019-05-01 ENCOUNTER — Encounter: Payer: Self-pay | Admitting: Family Medicine

## 2019-05-01 ENCOUNTER — Other Ambulatory Visit: Payer: Self-pay

## 2019-05-01 VITALS — BP 120/80 | HR 103 | Temp 97.1°F | Ht 66.0 in | Wt 191.0 lb

## 2019-05-01 DIAGNOSIS — M25512 Pain in left shoulder: Secondary | ICD-10-CM | POA: Diagnosis not present

## 2019-05-01 DIAGNOSIS — T7840XD Allergy, unspecified, subsequent encounter: Secondary | ICD-10-CM | POA: Diagnosis not present

## 2019-05-01 MED ORDER — METHYLPREDNISOLONE ACETATE 40 MG/ML IJ SUSP
20.0000 mg | Freq: Once | INTRAMUSCULAR | Status: AC
  Administered 2019-05-01: 20 mg via INTRA_ARTICULAR

## 2019-05-01 MED ORDER — LEVOCETIRIZINE DIHYDROCHLORIDE 5 MG PO TABS: 5.0000 mg | ORAL_TABLET | Freq: Every evening | ORAL | 2 refills | Status: DC

## 2019-05-01 MED ORDER — PREDNISONE 20 MG PO TABS: 40.0000 mg | ORAL_TABLET | Freq: Every day | ORAL | 0 refills | Status: AC

## 2019-05-01 NOTE — Patient Instructions (Addendum)
Heat (pad or rice pillow in microwave) over affected area, 10-15 minutes twice daily.   OK to take Tylenol 1000 mg (2 extra strength tabs) or 975 mg (3 regular strength tabs) every 6 hours as needed.  Hold off on Naproxen while on Prednisone. The shot can take a week to 10 days to kick in fully.   Let us know in hte next 2-3 weeks if you are not turning the corner.   EXERCISES  RANGE OF MOTION (ROM) AND STRETCHING EXERCISES These exercises may help you when beginning to rehabilitate your injury. While completing these exercises, remember:   Restoring tissue flexibility helps normal motion to return to the joints. This allows healthier, less painful movement and activity.  An effective stretch should be held for at least 30 seconds.  A stretch should never be painful. You should only feel a gentle lengthening or release in the stretched tissue.  ROM - Pendulum  Bend at the waist so that your right / left arm falls away from your body. Support yourself with your opposite hand on a solid surface, such as a table or a countertop.  Your right / left arm should be perpendicular to the ground. If it is not perpendicular, you need to lean over farther. Relax the muscles in your right / left arm and shoulder as much as possible.  Gently sway your hips and trunk so they move your right / left arm without any use of your right / left shoulder muscles.  Progress your movements so that your right / left arm moves side to side, then forward and backward, and finally, both clockwise and counterclockwise.  Complete 10-15 repetitions in each direction. Many people use this exercise to relieve discomfort in their shoulder as well as to gain range of motion. Repeat 2 times. Complete this exercise 3 times per week.  STRETCH - Flexion, Standing  Stand with good posture. With an underhand grip on your right / left hand and an overhand grip on the opposite hand, grasp a broomstick or cane so that your  hands are a little more than shoulder-width apart.  Keeping your right / left elbow straight and shoulder muscles relaxed, push the stick with your opposite hand to raise your right / left arm in front of your body and then overhead. Raise your arm until you feel a stretch in your right / left shoulder, but before you have increased shoulder pain.  Try to avoid shrugging your right / left shoulder as your arm rises by keeping your shoulder blade tucked down and toward your mid-back spine. Hold 30 seconds.  Slowly return to the starting position. Repeat 2 times. Complete this exercise 3 times per week.  STRETCH - Internal Rotation  Place your right / left hand behind your back, palm-up.  Throw a towel or belt over your opposite shoulder. Grasp the towel/belt with your right / left hand.  While keeping an upright posture, gently pull up on the towel/belt until you feel a stretch in the front of your right / left shoulder.  Avoid shrugging your right / left shoulder as your arm rises by keeping your shoulder blade tucked down and toward your mid-back spine.  Hold 30. Release the stretch by lowering your opposite hand. Repeat 2 times. Complete this exercise 3 times per week.  STRETCH - External Rotation and Abduction  Stagger your stance through a doorframe. It does not matter which foot is forward.  As instructed by your physician, physical therapist  or athletic trainer, place your hands: ? And forearms above your head and on the door frame. ? And forearms at head-height and on the door frame. ? At elbow-height and on the door frame.  Keeping your head and chest upright and your stomach muscles tight to prevent over-extending your low-back, slowly shift your weight onto your front foot until you feel a stretch across your chest and/or in the front of your shoulders.  Hold 30 seconds. Shift your weight to your back foot to release the stretch. Repeat 2 times. Complete this stretch 3  times per week.   STRENGTHENING EXERCISES  These exercises may help you when beginning to rehabilitate your injury. They may resolve your symptoms with or without further involvement from your physician, physical therapist or athletic trainer. While completing these exercises, remember:   Muscles can gain both the endurance and the strength needed for everyday activities through controlled exercises.  Complete these exercises as instructed by your physician, physical therapist or athletic trainer. Progress the resistance and repetitions only as guided.  You may experience muscle soreness or fatigue, but the pain or discomfort you are trying to eliminate should never worsen during these exercises. If this pain does worsen, stop and make certain you are following the directions exactly. If the pain is still present after adjustments, discontinue the exercise until you can discuss the trouble with your clinician.  If advised by your physician, during your recovery, avoid activity or exercises which involve actions that place your right / left hand or elbow above your head or behind your back or head. These positions stress the tissues which are trying to heal.  STRENGTH - Scapular Depression and Adduction  With good posture, sit on a firm chair. Supported your arms in front of you with pillows, arm rests or a table top. Have your elbows in line with the sides of your body.  Gently draw your shoulder blades down and toward your mid-back spine. Gradually increase the tension without tensing the muscles along the top of your shoulders and the back of your neck.  Hold for 3 seconds. Slowly release the tension and relax your muscles completely before completing the next repetition.  After you have practiced this exercise, remove the arm support and complete it in standing as well as sitting. Repeat 2 times. Complete this exercise 3 times per week.   STRENGTH - External Rotators  Secure a rubber  exercise band/tubing to a fixed object so that it is at the same height as your right / left elbow when you are standing or sitting on a firm surface.  Stand or sit so that the secured exercise band/tubing is at your side that is not injured.  Bend your elbow 90 degrees. Place a folded towel or small pillow under your right / left arm so that your elbow is a few inches away from your side.  Keeping the tension on the exercise band/tubing, pull it away from your body, as if pivoting on your elbow. Be sure to keep your body steady so that the movement is only coming from your shoulder rotating.  Hold 3 seconds. Release the tension in a controlled manner as you return to the starting position. Repeat 2 times. Complete this exercise 3 times per week.   STRENGTH - Supraspinatus  Stand or sit with good posture. Grasp a 2-3 lb weight or an exercise band/tubing so that your hand is "thumbs-up," like when you shake hands.  Slowly lift your right /  left hand from your thigh into the air, traveling about 30 degrees from straight out at your side. Lift your hand to shoulder height or as far as you can without increasing any shoulder pain. Initially, many people do not lift their hands above shoulder height.  Avoid shrugging your right / left shoulder as your arm rises by keeping your shoulder blade tucked down and toward your mid-back spine.  Hold for 3 seconds. Control the descent of your hand as you slowly return to your starting position. Repeat 2 times. Complete this exercise 3 times per week.   STRENGTH - Shoulder Extensors  Secure a rubber exercise band/tubing so that it is at the height of your shoulders when you are either standing or sitting on a firm arm-less chair.  With a thumbs-up grip, grasp an end of the band/tubing in each hand. Straighten your elbows and lift your hands straight in front of you at shoulder height. Step back away from the secured end of band/tubing until it becomes  tense.  Squeezing your shoulder blades together, pull your hands down to the sides of your thighs. Do not allow your hands to go behind you.  Hold for 3 seconds. Slowly ease the tension on the band/tubing as you reverse the directions and return to the starting position. Repeat 2 times. Complete this exercise 3 times per week.   STRENGTH - Scapular Retractors  Secure a rubber exercise band/tubing so that it is at the height of your shoulders when you are either standing or sitting on a firm arm-less chair.  With a palm-down grip, grasp an end of the band/tubing in each hand. Straighten your elbows and lift your hands straight in front of you at shoulder height. Step back away from the secured end of band/tubing until it becomes tense.  Squeezing your shoulder blades together, draw your elbows back as you bend them. Keep your upper arm lifted away from your body throughout the exercise.  Hold 3 seconds. Slowly ease the tension on the band/tubing as you reverse the directions and return to the starting position. Repeat 2 times. Complete this exercise 3 times per week.  STRENGTH - Scapular Depressors  Find a sturdy chair without wheels, such as a from a dining room table.  Keeping your feet on the floor, lift your bottom from the seat and lock your elbows.  Keeping your elbows straight, allow gravity to pull your body weight down. Your shoulders will rise toward your ears.  Raise your body against gravity by drawing your shoulder blades down your back, shortening the distance between your shoulders and ears. Although your feet should always maintain contact with the floor, your feet should progressively support less body weight as you get stronger.  Hold 3 seconds. In a controlled and slow manner, lower your body weight to begin the next repetition. Repeat 2 times. Complete this exercise 3 times per week.   This information is not intended to replace advice given to you by your health  care provider. Make sure you discuss any questions you have with your health care provider.  Document Released: 06/08/2005 Document Revised: 08/15/2014 Document Reviewed: 11/06/2008 Elsevier Interactive Patient Education [employer].

## 2019-05-01 NOTE — Progress Notes (Signed)
Musculoskeletal Exam  Patient: April Ho DOB: 06-06-1976  DOS: 05/01/2019  SUBJECTIVE:  Chief Complaint:   Chief Complaint  Patient presents with  . Shoulder Pain    left for 2 weeks.    April Ho is a 43 y.o.  female for evaluation and treatment of L shoulder pain.   Onset:  2 weeks ago. Picked up a box and had immediate pain.  Location: L lateral shoulder Character:  aching  Progression of issue:  has worsened Associated symptoms: pain when she lifts Treatment: to date has been OTC NSAIDS.   Neurovascular symptoms: no  Patient is still dealing with allergies.  She is taking Zyrtec as needed.  She refrain from taking it every day due to drowsiness caused by it.  She has not been on anything else recently.  ROS: Musculoskeletal/Extremities: +L shoulder pain Neuro: No weakness  Past Medical History:  Diagnosis Date  . Genital warts   . GERD (gastroesophageal reflux disease)     Objective: VITAL SIGNS: BP 120/80 (BP Location: Right Arm, Patient Position: Sitting, Cuff Size: Normal)   Pulse (!) 103   Temp (!) 97.1 F (36.2 C) (Temporal)   Ht 5\' 6"  (1.676 m)   Wt 191 lb (86.6 kg)   SpO2 99%   BMI 30.83 kg/m  Constitutional: Well formed, well developed. No acute distress. Thorax & Lungs: No accessory muscle use Musculoskeletal: Left shoulder.   Normal active range of motion: no.   Normal passive range of motion: no Tenderness to palpation: no Deformity: no Ecchymosis: no Tests positive: Neer's, Hawkins, empty can Tests negative: Crossover, liftoff, speeds equivocal Neurologic: Normal sensory function. No focal deficits noted. DTR's equal and symmetric in UE's. No clonus. Psychiatric: Normal mood. Age appropriate judgment and insight. Alert & oriented x 3.    Procedure Note; Shoulder bursa injection Verbal consent obtained. The area was palpated, an area was marked just caudal to the acromion process laterally, and cleaned with Betadine x1. A 27-gauge  needle was used to enter the joint laterally with ease. 40 mg of Depomedrol with 2 mL of 1% lidocaine was injected. The patient tolerated the procedure well. There were no complications noted.  Assessment:  Acute pain of left shoulder - Plan: predniSONE (DELTASONE) 20 MG tablet, PR DRAIN/INJECT LARGE JOINT/BURSA, methylPREDNISolone acetate (DEPO-MEDROL) injection 20 mg  Allergic state, subsequent encounter - Plan: levocetirizine (XYZAL) 5 MG tablet  Plan: Orders as above. Stretches and exercise, heat, Tylenol, Pred burst.  PT if no improvement.  Letter giving weight restriction for lifting at work, 20 pounds or less over the next 4 weeks. Xyzal to replace cetirizine. F/u prn. The patient voiced understanding and agreement to the plan.   [place], DO 05/01/19  12:09 PM

## 2019-06-28 ENCOUNTER — Ambulatory Visit: Payer: Self-pay

## 2019-06-28 NOTE — Telephone Encounter (Signed)
Patient called stating that 2 weeks ago she stepped out of her car and twisted her rt ankle.  She is calling today stating that is is still swollen and she is unable to wear her shoe.  She is standing on her ankle but some steps can take her breath  She rates the pain at 5. She had no cuts or scrapes. No bruising. Care advice read to patient. She verbalized understanding. Call placed to office. Patient was advised to go to [facility] after hour emergency care tonight 5:30-9pm for evaluation.  She states she will look for closer care facility. Reason for Disposition . [1] Limp when walking AND [2] due to a twisted ankle or foot  Answer Assessment - Initial Assessment Questions 1. MECHANISM: "How did the injury happen?" (e.g., twisting injury, direct blow)      stepped wrong 2. ONSET: "When did the injury happen?" (Minutes or hours ago)     2 weeks 3. LOCATION: "Where is the injury located?"      rt 4. APPEARANCE of INJURY: "What does the injury look like?"      Swollen hard to wear shoe 5. WEIGHT-BEARING: "Can you put weight on that foot?" "Can you walk (four steps or more)?"       yes 6. SIZE: For cuts, bruises, or swelling, ask: "How large is it?" (e.g., inches or centimeters;  entire joint)     None just swelling 7. PAIN: "Is there pain?" If so, ask: "How bad is the pain?"    (e.g., Scale 1-10; or mild, moderate, severe)    5 8. TETANUS: For any breaks in the skin, ask: "When was the last tetanus booster?"     N/A 9. OTHER SYMPTOMS: "Do you have any other symptoms?"      no 10. PREGNANCY: "Is there any chance you are pregnant?" "When was your last menstrual period?"      No depo  Protocols used: ANKLE AND FOOT INJURY-A-AH

## 2019-07-25 ENCOUNTER — Emergency Department (HOSPITAL_BASED_OUTPATIENT_CLINIC_OR_DEPARTMENT_OTHER): Payer: No Typology Code available for payment source

## 2019-07-25 ENCOUNTER — Other Ambulatory Visit: Payer: Self-pay

## 2019-07-25 ENCOUNTER — Emergency Department (HOSPITAL_BASED_OUTPATIENT_CLINIC_OR_DEPARTMENT_OTHER)
Admission: EM | Admit: 2019-07-25 | Discharge: 2019-07-25 | Disposition: A | Discharge status: Home / Self Care | Payer: No Typology Code available for payment source | Attending: Emergency Medicine | Admitting: Emergency Medicine

## 2019-07-25 ENCOUNTER — Encounter (HOSPITAL_BASED_OUTPATIENT_CLINIC_OR_DEPARTMENT_OTHER): Payer: Self-pay | Admitting: *Deleted

## 2019-07-25 DIAGNOSIS — R002 Palpitations: Secondary | ICD-10-CM

## 2019-07-25 DIAGNOSIS — Z79899 Other long term (current) drug therapy: Secondary | ICD-10-CM | POA: Diagnosis not present

## 2019-07-25 LAB — CBC WITH DIFFERENTIAL/PLATELET
Abs Immature Granulocytes: 0 10*3/uL (ref 0.00–0.07)
Basophils Absolute: 0 10*3/uL (ref 0.0–0.1)
Basophils Relative: 0 %
Eosinophils Absolute: 0.1 10*3/uL (ref 0.0–0.5)
Eosinophils Relative: 2 %
HCT: 40.3 % (ref 36.0–46.0)
Hemoglobin: 12.8 g/dL (ref 12.0–15.0)
Immature Granulocytes: 0 %
Lymphocytes Relative: 26 %
Lymphs Abs: 0.9 10*3/uL (ref 0.7–4.0)
MCH: 31.5 pg (ref 26.0–34.0)
MCHC: 31.8 g/dL (ref 30.0–36.0)
MCV: 99.3 fL (ref 80.0–100.0)
Monocytes Absolute: 0.4 10*3/uL (ref 0.1–1.0)
Monocytes Relative: 13 %
Neutro Abs: 2 10*3/uL (ref 1.7–7.7)
Neutrophils Relative %: 59 %
Platelets: 270 10*3/uL (ref 150–400)
RBC: 4.06 MIL/uL (ref 3.87–5.11)
RDW: 12.7 % (ref 11.5–15.5)
WBC: 3.4 10*3/uL — ABNORMAL LOW (ref 4.0–10.5)
nRBC: 0 % (ref 0.0–0.2)

## 2019-07-25 LAB — BASIC METABOLIC PANEL
Anion gap: 10 (ref 5–15)
BUN: 15 mg/dL (ref 6–20)
CO2: 25 mmol/L (ref 22–32)
Calcium: 9.4 mg/dL (ref 8.9–10.3)
Chloride: 106 mmol/L (ref 98–111)
Creatinine, Ser: 0.67 mg/dL (ref 0.44–1.00)
GFR calc Af Amer: >60 mL/min (ref >60)
GFR calc non Af Amer: >60 mL/min (ref >60)
Glucose, Bld: 86 mg/dL (ref 70–99)
Potassium: 3.8 mmol/L (ref 3.5–5.1)
Sodium: 141 mmol/L (ref 135–145)

## 2019-07-25 LAB — PREGNANCY, URINE: Preg Test, Ur: NEGATIVE

## 2019-07-25 LAB — TSH: TSH: 0.475 u[IU]/mL (ref 0.350–4.500)

## 2019-07-25 NOTE — ED Provider Notes (Signed)
[facility]ERGENCY DEPARTMENT Provider Note   CSN: [id number] Arrival date & time: 07/25/19  1744     History Chief Complaint  Patient presents with  . Palpitations    April Ho is a 43 y.o. female with a hx of GERD who presents to the emergency department with complaints of intermittent palpitations for the past 1 week.  Patient describes her symptoms as feeling as though her heart is beating quickly and pounding, states this occurs a few times per day lasting less than a minute at a time, occasionally feels a bit short of breath with the palpitations, otherwise no dyspnea.  She notices more after eating with her GERD, otherwise no specific alleviating or aggravating factors.  She denies fever, chills, nausea, vomiting, diaphoresis, lightheadedness, dizziness, syncope, chest pain, leg pain/swelling, hemoptysis, recent surgery/trauma, recent long travel, hormone use, personal hx of cancer, or hx of DVT/PE.  Denies alcohol or drug use.  Denies increase in caffeine or change in diet.  HPI     Past Medical History:  Diagnosis Date  . Genital warts   . GERD (gastroesophageal reflux disease)     Patient Active Problem List   Diagnosis Date Noted  . Genital warts 03/22/2018    Past Surgical History:  Procedure Laterality Date  . CESAREAN SECTION  03/25/1998  . TONSILLECTOMY    . WISDOM TOOTH EXTRACTION       OB History   No obstetric history on file.     Family History  Problem Relation Age of Onset  . Asthma Paternal Grandmother   . Multiple sclerosis Mother   . Diabetes Father   . Kidney failure Father   . Pancreatic disease Father   . Hypertension Father   . Heart attack Father   . Cancer Neg Hx   . Colon cancer Neg Hx   . Esophageal cancer Neg Hx   . Rectal cancer Neg Hx   . Stomach cancer Neg Hx     Social History   Tobacco Use  . Smoking status: Never Smoker  . Smokeless tobacco: Never Used  Substance Use Topics  . Alcohol use: No  .  Drug use: No    Home Medications Prior to Admission medications   Medication Sig Start Date End Date Taking? Authorizing Provider  clotrimazole-betamethasone (LOTRISONE) cream Apply 1 application topically 2 (two) times daily. 01/28/19   Shelda Pal, DO  EPINEPHrine 0.3 mg/0.3 mL IJ SOAJ injection Inject 0.3 mLs (0.3 mg total) into the muscle as needed for anaphylaxis. 01/29/19   Wendling, Crosby Oyster, DO  imiquimod (ALDARA) 5 % cream Apply topically 3 (three) times a week. 06/08/18   Shelda Pal, DO  levocetirizine (XYZAL) 5 MG tablet Take 1 tablet (5 mg total) by mouth every evening. 05/01/19   Wendling, Crosby Oyster, DO  pantoprazole (PROTONIX) 40 MG tablet Take 1 tablet (40 mg total) by mouth 2 (two) times daily before a meal. 02/27/19   Esterwood, Amy S, PA-C  triamcinolone ointment (KENALOG) 0.5 % Apply 1 application topically 2 (two) times daily. 02/07/19   Valentina Shaggy, MD    Allergies    Cat hair extract, Dog epithelium, and Dust mite extract  Review of Systems   Review of Systems  Constitutional: Negative for chills and fever.  Respiratory: Positive for shortness of breath.   Cardiovascular: Positive for palpitations. Negative for chest pain and leg swelling.  Gastrointestinal: Negative for abdominal pain, blood in stool, diarrhea, nausea and vomiting.  Genitourinary:  Negative for enuresis.  Neurological: Negative for dizziness, syncope, weakness, light-headedness, numbness and headaches.  All other systems reviewed and are negative.   Physical Exam Updated Vital Signs BP 114/77   Pulse 76   Temp 98.3 F (36.8 C) (Oral)   Resp 13   Ht 5\' 7"  (1.702 m)   Wt 85.3 kg   SpO2 100%   BMI 29.44 kg/m   Physical Exam Vitals and nursing note reviewed.  Constitutional:      General: She is not in acute distress.    Appearance: She is well-developed. She is not toxic-appearing.  HENT:     Head: Normocephalic and atraumatic.  Eyes:      General:        Right eye: No discharge.        Left eye: No discharge.     Conjunctiva/sclera: Conjunctivae normal.  Cardiovascular:     Rate and Rhythm: Normal rate and regular rhythm.     Comments: 2+ symmetric radial pulses. Pulmonary:     Effort: Pulmonary effort is normal. No respiratory distress.     Breath sounds: Normal breath sounds. No wheezing, rhonchi or rales.  Abdominal:     General: There is no distension.     Palpations: Abdomen is soft.     Tenderness: There is no abdominal tenderness. There is no guarding or rebound.  Musculoskeletal:     Cervical back: Neck supple.     Right lower leg: No edema.     Left lower leg: No edema.  Skin:    General: Skin is warm and dry.     Findings: No rash.  Neurological:     Mental Status: She is alert.     Comments: Clear speech.   Psychiatric:        Behavior: Behavior normal.    ED Results / Procedures / Treatments   Labs (all labs ordered are listed, but only abnormal results are displayed) Labs Reviewed  CBC WITH DIFFERENTIAL/PLATELET - Abnormal; Notable for the following components:      Result Value   WBC 3.4 (*)    All other components within normal limits  BASIC METABOLIC PANEL  PREGNANCY, URINE  TSH    EKG EKG Interpretation  Date/Time:  Thursday July 25 2019 17:51:39 EST Ventricular Rate:  88 PR Interval:  154 QRS Duration: 74 QT Interval:  340 QTC Calculation: 411 R Axis:   87 Text Interpretation: Normal sinus rhythm Right atrial enlargement Borderline ECG Since last tracing rate slower Confirmed by Dorie Rank [phone]) on 07/25/2019 5:55:05 PM   Radiology DG Chest Port 1 View  Result Date: 07/25/2019 CLINICAL DATA:  Palpitations.  Dyspnea. EXAM: PORTABLE CHEST 1 VIEW COMPARISON:  08/12/2017 FINDINGS: The heart size and mediastinal contours are within normal limits. Both lungs are clear. No pleural effusion or pneumothorax. The visualized skeletal structures are unremarkable. IMPRESSION: No  active disease. Electronically Signed   By: Lajean Manes M.D.   On: 07/25/2019 18:59    Procedures Procedures (including critical care time)  Medications Ordered in ED Medications - No data to display  ED Course/MDM;  I have reviewed the triage vital signs and the nursing notes.  Pertinent labs & imaging results that were available during my care of the patient were reviewed by me and considered in my medical decision making (see chart for details).  Patient presents to the ED with complaints of intermittent palpitations x 1 week. She is nontoxic appearing, resting comfortably, vitals without significant abnormality.  Exam is fairly benign.  Plan for labs, CXR, EKG, cardiac monitoring.   EKG: Normal sinus rhythm Right atrial enlargement Borderline ECG Since last tracing rate slower CBC: mild leukopenia - will need PCP recheck. No anemia. No leukocytosis. PLT WNL.  BMP: WNL. No electrolyte derangement. Renal function WNL.  Preg test: Negative CXR:  No active disease  No electrolyte derangement or anemia. No pneumonia, pneumothorax, or fluid overload on CXR. PERC negative- doubt PE. EKG without ischemic changes. Cardiac monitor reviewed and unremarkable- no significant arrhythmias. TSH pending- will not likely result during visit- vitals remain stable. Appears appropriate for discharge with PCP/cardiology follow up for possible holter monitoring. I discussed results, treatment plan, need for follow-up, and return precautions with the patient. Provided opportunity for questions, patient confirmed understanding and is in agreement with plan.     Final Clinical Impression(s) / ED Diagnoses Final diagnoses:  Palpitations    Rx / DC Orders ED Discharge Orders    None       Leafy Kindle 07/25/19 [id number]    Dorie Rank, MD 07/29/19 [phone]

## 2019-07-25 NOTE — Discharge Instructions (Addendum)
You were seen in the emergency department today for palpitations.  Your work-up was overall reassuring.  Your labs show that your white blood cell count was very mildly low at 3.4, please have this rechecked by your primary care provider in 1 to 2 weeks.  Your chest x-ray was normal.  Your EKG and heart monitoring did not show any substantial abnormalities.  We would like you to follow-up with your primary care provider and/or cardiology to discuss possibility of Holter monitoring to further evaluate your palpitations.  Please call for follow-up within the next 3 days.  Return to the ER for new or worsening symptoms including but not limited to persistent palpitations, chest pain, passing out, fever, or any other concerns.

## 2019-07-25 NOTE — ED Triage Notes (Signed)
Pt c/o " palpitations" x 1 day

## 2019-07-31 ENCOUNTER — Encounter: Payer: Self-pay | Admitting: Family Medicine

## 2019-07-31 ENCOUNTER — Other Ambulatory Visit: Payer: Self-pay

## 2019-07-31 ENCOUNTER — Ambulatory Visit (INDEPENDENT_AMBULATORY_CARE_PROVIDER_SITE_OTHER): Payer: No Typology Code available for payment source | Admitting: Family Medicine

## 2019-07-31 DIAGNOSIS — D72819 Decreased white blood cell count, unspecified: Secondary | ICD-10-CM | POA: Diagnosis not present

## 2019-07-31 DIAGNOSIS — R109 Unspecified abdominal pain: Secondary | ICD-10-CM | POA: Diagnosis not present

## 2019-07-31 DIAGNOSIS — L299 Pruritus, unspecified: Secondary | ICD-10-CM

## 2019-07-31 DIAGNOSIS — T7840XD Allergy, unspecified, subsequent encounter: Secondary | ICD-10-CM | POA: Diagnosis not present

## 2019-07-31 DIAGNOSIS — R002 Palpitations: Secondary | ICD-10-CM

## 2019-07-31 MED ORDER — PROPRANOLOL HCL ER 60 MG PO CP24: 60.0000 mg | ORAL_CAPSULE | Freq: Every day | ORAL | 1 refills | Status: DC

## 2019-07-31 MED ORDER — PREDNISONE 20 MG PO TABS: 40.0000 mg | ORAL_TABLET | Freq: Every day | ORAL | 0 refills | Status: AC

## 2019-07-31 NOTE — Patient Instructions (Signed)
If you do not hear anything about your referral in the next 1-2 weeks, call our office and ask for an update.  Heat (pad or rice pillow in microwave) over affected area, 10-15 minutes twice daily.   OK to take Tylenol 1000 mg (2 extra strength tabs) or 975 mg (3 regular strength tabs) every 6 hours as needed.  EXERCISES  RANGE OF MOTION (ROM) AND STRETCHING EXERCISES - Low Back Pain Most people with lower back pain will find that their symptoms get worse with excessive bending forward (flexion) or arching at the lower back (extension). The exercises that will help resolve your symptoms will focus on the opposite motion.  If you have pain, numbness or tingling which travels down into your buttocks, leg or foot, the goal of the therapy is for these symptoms to move closer to your back and eventually resolve. Sometimes, these leg symptoms will get better, but your lower back pain may worsen. This is often an indication of progress in your rehabilitation. Be very alert to any changes in your symptoms and the activities in which you participated in the 24 hours prior to the change. Sharing this information with your caregiver will allow him or her to most efficiently treat your condition. These exercises may help you when beginning to rehabilitate your injury. Your symptoms may resolve with or without further involvement from your physician, physical therapist or athletic trainer. While completing these exercises, remember:   Restoring tissue flexibility helps normal motion to return to the joints. This allows healthier, less painful movement and activity.  An effective stretch should be held for at least 30 seconds.  A stretch should never be painful. You should only feel a gentle lengthening or release in the stretched tissue. FLEXION RANGE OF MOTION AND STRETCHING EXERCISES:  STRETCH - Flexion, Single Knee to Chest   Lie on a firm bed or floor with both legs extended in front of you.  Keeping  one leg in contact with the floor, bring your opposite knee to your chest. Hold your leg in place by either grabbing behind your thigh or at your knee.  Pull until you feel a gentle stretch in your low back. Hold 30 seconds.  Slowly release your grasp and repeat the exercise with the opposite side. Repeat 2 times. Complete this exercise 3 times per week.   STRETCH - Flexion, Double Knee to Chest  Lie on a firm bed or floor with both legs extended in front of you.  Keeping one leg in contact with the floor, bring your opposite knee to your chest.  Tense your stomach muscles to support your back and then lift your other knee to your chest. Hold your legs in place by either grabbing behind your thighs or at your knees.  Pull both knees toward your chest until you feel a gentle stretch in your low back. Hold 30 seconds.  Tense your stomach muscles and slowly return one leg at a time to the floor. Repeat 2 times. Complete this exercise 3 times per week.   STRETCH - Low Trunk Rotation  Lie on a firm bed or floor. Keeping your legs in front of you, bend your knees so they are both pointed toward the ceiling and your feet are flat on the floor.  Extend your arms out to the side. This will stabilize your upper body by keeping your shoulders in contact with the floor.  Gently and slowly drop both knees together to one side until you feel  a gentle stretch in your low back. Hold for 30 seconds.  Tense your stomach muscles to support your lower back as you bring your knees back to the starting position. Repeat the exercise to the other side. Repeat 2 times. Complete this exercise at least 3 times per week.   EXTENSION RANGE OF MOTION AND FLEXIBILITY EXERCISES:  STRETCH - Extension, Prone on Elbows   Lie on your stomach on the floor, a bed will be too soft. Place your palms about shoulder width apart and at the height of your head.  Place your elbows under your shoulders. If this is too  painful, stack pillows under your chest.  Allow your body to relax so that your hips drop lower and make contact more completely with the floor.  Hold this position for 30 seconds.  Slowly return to lying flat on the floor. Repeat 2 times. Complete this exercise 3 times per week.   RANGE OF MOTION - Extension, Prone Press Ups  Lie on your stomach on the floor, a bed will be too soft. Place your palms about shoulder width apart and at the height of your head.  Keeping your back as relaxed as possible, slowly straighten your elbows while keeping your hips on the floor. You may adjust the placement of your hands to maximize your comfort. As you gain motion, your hands will come more underneath your shoulders.  Hold this position 30 seconds.  Slowly return to lying flat on the floor. Repeat 2 times. Complete this exercise 3 times per week.   RANGE OF MOTION- Quadruped, Neutral Spine   Assume a hands and knees position on a firm surface. Keep your hands under your shoulders and your knees under your hips. You may place padding under your knees for comfort.  Drop your head and point your tailbone toward the ground below you. This will round out your lower back like an angry cat. Hold this position for 30 seconds.  Slowly lift your head and release your tail bone so that your back sags into a large arch, like an old horse.  Hold this position for 30 seconds.  Repeat this until you feel limber in your low back.  Now, find your "sweet spot." This will be the most comfortable position somewhere between the two previous positions. This is your neutral spine. Once you have found this position, tense your stomach muscles to support your low back.  Hold this position for 30 seconds. Repeat 2 times. Complete this exercise 3 times per week.   STRENGTHENING EXERCISES - Low Back Sprain These exercises may help you when beginning to rehabilitate your injury. These exercises should be done near  your "sweet spot." This is the neutral, low-back arch, somewhere between fully rounded and fully arched, that is your least painful position. When performed in this safe range of motion, these exercises can be used for people who have either a flexion or extension based injury. These exercises may resolve your symptoms with or without further involvement from your physician, physical therapist or athletic trainer. While completing these exercises, remember:   Muscles can gain both the endurance and the strength needed for everyday activities through controlled exercises.  Complete these exercises as instructed by your physician, physical therapist or athletic trainer. Increase the resistance and repetitions only as guided.  You may experience muscle soreness or fatigue, but the pain or discomfort you are trying to eliminate should never worsen during these exercises. If this pain does worsen, stop and  make certain you are following the directions exactly. If the pain is still present after adjustments, discontinue the exercise until you can discuss the trouble with your caregiver.  STRENGTHENING - Deep Abdominals, Pelvic Tilt   Lie on a firm bed or floor. Keeping your legs in front of you, bend your knees so they are both pointed toward the ceiling and your feet are flat on the floor.  Tense your lower abdominal muscles to press your low back into the floor. This motion will rotate your pelvis so that your tail bone is scooping upwards rather than pointing at your feet or into the floor. With a gentle tension and even breathing, hold this position for 3 seconds. Repeat 2 times. Complete this exercise 3 times per week.   STRENGTHENING - Abdominals, Crunches   Lie on a firm bed or floor. Keeping your legs in front of you, bend your knees so they are both pointed toward the ceiling and your feet are flat on the floor. Cross your arms over your chest.  Slightly tip your chin down without bending your  neck.  Tense your abdominals and slowly lift your trunk high enough to just clear your shoulder blades. Lifting higher can put excessive stress on the lower back and does not further strengthen your abdominal muscles.  Control your return to the starting position. Repeat 2 times. Complete this exercise 3 times per week.   STRENGTHENING - Quadruped, Opposite UE/LE Lift   Assume a hands and knees position on a firm surface. Keep your hands under your shoulders and your knees under your hips. You may place padding under your knees for comfort.  Find your neutral spine and gently tense your abdominal muscles so that you can maintain this position. Your shoulders and hips should form a rectangle that is parallel with the floor and is not twisted.  Keeping your trunk steady, lift your right hand no higher than your shoulder and then your left leg no higher than your hip. Make sure you are not holding your breath. Hold this position for 30 seconds.  Continuing to keep your abdominal muscles tense and your back steady, slowly return to your starting position. Repeat with the opposite arm and leg. Repeat 2 times. Complete this exercise 3 times per week.   STRENGTHENING - Abdominals and Quadriceps, Straight Leg Raise   Lie on a firm bed or floor with both legs extended in front of you.  Keeping one leg in contact with the floor, bend the other knee so that your foot can rest flat on the floor.  Find your neutral spine, and tense your abdominal muscles to maintain your spinal position throughout the exercise.  Slowly lift your straight leg off the floor about 6 inches for a count of 3, making sure to not hold your breath.  Still keeping your neutral spine, slowly lower your leg all the way to the floor. Repeat this exercise with each leg 2 times. Complete this exercise 3 times per week.  POSTURE AND BODY MECHANICS CONSIDERATIONS - Low Back Sprain Keeping correct posture when sitting, standing or  completing your activities will reduce the stress put on different body tissues, allowing injured tissues a chance to heal and limiting painful experiences. The following are general guidelines for improved posture.  While reading these guidelines, remember:  The exercises prescribed by your provider will help you have the flexibility and strength to maintain correct postures.  The correct posture provides the best environment for your joints  to work. All of your joints have less wear and tear when properly supported by a spine with good posture. This means you will experience a healthier, less painful body.  Correct posture must be practiced with all of your activities, especially prolonged sitting and standing. Correct posture is as important when doing repetitive low-stress activities (typing) as it is when doing a single heavy-load activity (lifting).  RESTING POSITIONS Consider which positions are most painful for you when choosing a resting position. If you have pain with flexion-based activities (sitting, bending, stooping, squatting), choose a position that allows you to rest in a less flexed posture. You would want to avoid curling into a fetal position on your side. If your pain worsens with extension-based activities (prolonged standing, working overhead), avoid resting in an extended position such as sleeping on your stomach. Most people will find more comfort when they rest with their spine in a more neutral position, neither too rounded nor too arched. Lying on a non-sagging bed on your side with a pillow between your knees, or on your back with a pillow under your knees will often provide some relief. Keep in mind, being in any one position for a prolonged period of time, no matter how correct your posture, can still lead to stiffness.  PROPER SITTING POSTURE In order to minimize stress and discomfort on your spine, you must sit with correct posture. Sitting with good posture should be  effortless for a healthy body. Returning to good posture is a gradual process. Many people can work toward this most comfortably by using various supports until they have the flexibility and strength to maintain this posture on their own. When sitting with proper posture, your ears will fall over your shoulders and your shoulders will fall over your hips. You should use the back of the chair to support your upper back. Your lower back will be in a neutral position, just slightly arched. You may place a small pillow or folded towel at the base of your lower back for  support.  When working at a desk, create an environment that supports good, upright posture. Without extra support, muscles tire, which leads to excessive strain on joints and other tissues. Keep these recommendations in mind:  CHAIR:  A chair should be able to slide under your desk when your back makes contact with the back of the chair. This allows you to work closely.  The chair's height should allow your eyes to be level with the upper part of your monitor and your hands to be slightly lower than your elbows.  BODY POSITION  Your feet should make contact with the floor. If this is not possible, use a foot rest.  Keep your ears over your shoulders. This will reduce stress on your neck and low back.  INCORRECT SITTING POSTURES  If you are feeling tired and unable to assume a healthy sitting posture, do not slouch or slump. This puts excessive strain on your back tissues, causing more damage and pain. Healthier options include:  Using more support, like a lumbar pillow.  Switching tasks to something that requires you to be upright or walking.  Talking a brief walk.  Lying down to rest in a neutral-spine position.  PROLONGED STANDING WHILE SLIGHTLY LEANING FORWARD  When completing a task that requires you to lean forward while standing in one place for a long time, place either foot up on a stationary 2-4 inch high object to  help maintain the best posture. When  both feet are on the ground, the lower back tends to lose its slight inward curve. If this curve flattens (or becomes too large), then the back and your other joints will experience too much stress, tire more quickly, and can cause pain.  CORRECT STANDING POSTURES Proper standing posture should be assumed with all daily activities, even if they only take a few moments, like when brushing your teeth. As in sitting, your ears should fall over your shoulders and your shoulders should fall over your hips. You should keep a slight tension in your abdominal muscles to brace your spine. Your tailbone should point down to the ground, not behind your body, resulting in an over-extended swayback posture.   INCORRECT STANDING POSTURES  Common incorrect standing postures include a forward head, locked knees and/or an excessive swayback. WALKING Walk with an upright posture. Your ears, shoulders and hips should all line-up.  PROLONGED ACTIVITY IN A FLEXED POSITION When completing a task that requires you to bend forward at your waist or lean over a low surface, try to find a way to stabilize 3 out of 4 of your limbs. You can place a hand or elbow on your thigh or rest a knee on the surface you are reaching across. This will provide you more stability, so that your muscles do not tire as quickly. By keeping your knees relaxed, or slightly bent, you will also reduce stress across your lower back. CORRECT LIFTING TECHNIQUES  DO :  Assume a wide stance. This will provide you more stability and the opportunity to get as close as possible to the object which you are lifting.  Tense your abdominals to brace your spine. Bend at the knees and hips. Keeping your back locked in a neutral-spine position, lift using your leg muscles. Lift with your legs, keeping your back straight.  Test the weight of unknown objects before attempting to lift them.  Try to keep your elbows locked  down at your sides in order get the best strength from your shoulders when carrying an object.     Always ask for help when lifting heavy or awkward objects. INCORRECT LIFTING TECHNIQUES DO NOT:   Lock your knees when lifting, even if it is a small object.  Bend and twist. Pivot at your feet or move your feet when needing to change directions.  Assume that you can safely pick up even a paperclip without proper posture.

## 2019-07-31 NOTE — Progress Notes (Signed)
Chief Complaint  Patient presents with  . Hospitalization Follow-up    Subjective: Patient is a 43 y.o. female here for ED f/u. Due to COVID-19 pandemic, we are interacting via web portal for an electronic face-to-face visit. I verified patient's ID using 2 identifiers. Patient agreed to proceed with visit via this method. Patient is at home, I am at office. Patient and I are present for visit.   Pt went to ED on 12/17 w racing heart. Labs and imaging neg. Stress levels have been high. Will get sob when this happens, normally while she is laying down. Does not feel impending doom/death. No CP. Also in ED was found to have a low white count.   Pt continues to have side pain stemming from a kidney stone >1 yr ago. No blood in urine. No recent inj or change in activity. Naproxen was somewhat helpful.  Pt w hx of seasonal allergies. Takes Allegra as Zyrtec makes her sleepy. Requesting prednisone as this has helped in past. She is using unscented lotions w limited benefit. No fevers or sick contacts. Her son has a hx of allergies as well. Does not currently follow w an allergist.    ROS: Heart: +palpitations Lungs: Denies cough   Past Medical History:  Diagnosis Date  . Genital warts   . GERD (gastroesophageal reflux disease)     Objective: No conversational dyspnea Age appropriate judgment and insight Nml affect and mood   Assessment and Plan: Leukopenia, unspecified type - Plan: CBC w/Diff  Pruritus - Plan: predniSONE (DELTASONE) 20 MG tablet, Ambulatory referral to Allergy  Allergy, subsequent encounter - Plan: predniSONE (DELTASONE) 20 MG tablet, Ambulatory referral to Allergy  Flank pain  Palpitations - Plan: propranolol ER (INDERAL LA) 60 MG 24 hr capsule  1- Reck CBC 2- Pred burst, cont emollient.  3- Refer allergist. 4- Stretches/exercises, heat, Tylenol.  Question kidney stone this far out. 5- Start propranolol for symptomatic management and as adjunct to stress.   F/u in 1 mo.  The patient voiced understanding and agreement to the plan.  [place], DO 07/31/19  3:59 PM

## 2019-08-15 ENCOUNTER — Other Ambulatory Visit: Payer: Self-pay

## 2019-08-16 ENCOUNTER — Other Ambulatory Visit: Payer: Self-pay

## 2019-08-16 ENCOUNTER — Other Ambulatory Visit (HOSPITAL_COMMUNITY)
Admission: RE | Admit: 2019-08-16 | Discharge: 2019-08-16 | Disposition: A | Discharge status: Home / Self Care | Payer: No Typology Code available for payment source | Source: Ambulatory Visit | Attending: Family | Admitting: Family

## 2019-08-16 ENCOUNTER — Ambulatory Visit: Payer: No Typology Code available for payment source | Admitting: Family

## 2019-08-16 VITALS — BP 109/73 | HR 67 | Temp 96.6°F | Resp 16 | Ht 67.0 in | Wt 192.0 lb

## 2019-08-16 DIAGNOSIS — Z113 Encounter for screening for infections with a predominantly sexual mode of transmission: Secondary | ICD-10-CM

## 2019-08-16 DIAGNOSIS — R21 Rash and other nonspecific skin eruption: Secondary | ICD-10-CM

## 2019-08-16 DIAGNOSIS — N9089 Other specified noninflammatory disorders of vulva and perineum: Secondary | ICD-10-CM | POA: Diagnosis not present

## 2019-08-16 MED ORDER — VALACYCLOVIR HCL 1 G PO TABS: 1000.0000 mg | ORAL_TABLET | Freq: Two times a day (BID) | ORAL | 0 refills | Status: DC

## 2019-08-16 MED ORDER — HYDROXYZINE HCL 25 MG PO TABS: 25.0000 mg | ORAL_TABLET | Freq: Three times a day (TID) | ORAL | 0 refills | Status: DC | PRN

## 2019-08-16 MED ORDER — BETAMETHASONE DIPROPIONATE 0.05 % EX CREA: TOPICAL_CREAM | Freq: Two times a day (BID) | CUTANEOUS | 0 refills | Status: DC

## 2019-08-16 NOTE — Patient Instructions (Signed)
You may apply betamethasone (steroid cream) twice daily as need and use atarax 25mg  3 times daily as needed for itching. Please complete lab work prior to leaving.

## 2019-08-16 NOTE — Progress Notes (Signed)
Subjective:    Patient ID: April Ho, female    DOB: 1976-07-24, 44 y.o.   MRN: [id number]  HPI  Pt is a 43 yr old female who presents today for follow up. Reports + "skin irritation" from shaving pubic area. Reports that she first noticed last week.  No recent sexual activity.  Does want STD testing. Has hx of genital warts  She was treated for pruritis on 12/23 with prednisone, emollients and referred to allergist. Reports itching is "terrible." States she had some mild relief of itching while on prednisone.  She states no improvement in itching with zyrtec. She reports that she has removed fragranced product from her laundry and skin care.   Review of Systems See HPI  Past Medical History:  Diagnosis Date  . Genital warts   . GERD (gastroesophageal reflux disease)      Social History   Socioeconomic History  . Marital status: Single    Spouse name: Not on file  . Number of children: Not on file  . Years of education: Not on file  . Highest education level: Not on file  Occupational History  . Not on file  Tobacco Use  . Smoking status: Never Smoker  . Smokeless tobacco: Never Used  Substance and Sexual Activity  . Alcohol use: No  . Drug use: No  . Sexual activity: Not Currently    Birth control/protection: None    Comment: depo injection Jan 2020.  Per pt no chance of preganancy  Other Topics Concern  . Not on file  Social History Narrative  . Not on file   Social Determinants of Health   Financial Resource Strain:   . Difficulty of Paying Living Expenses: Not on file  Food Insecurity:   . Worried About Charity fundraiser in the Last Year: Not on file  . Ran Out of Food in the Last Year: Not on file  Transportation Needs:   . Lack of Transportation (Medical): Not on file  . Lack of Transportation (Non-Medical): Not on file  Physical Activity:   . Days of Exercise per Week: Not on file  . Minutes of Exercise per Session: Not on file  Stress:   .  Feeling of Stress : Not on file  Social Connections:   . Frequency of Communication with Friends and Family: Not on file  . Frequency of Social Gatherings with Friends and Family: Not on file  . Attends Religious Services: Not on file  . Active Member of Clubs or Organizations: Not on file  . Attends Archivist Meetings: Not on file  . Marital Status: Not on file  Intimate Partner Violence:   . Fear of Current or Ex-Partner: Not on file  . Emotionally Abused: Not on file  . Physically Abused: Not on file  . Sexually Abused: Not on file    Past Surgical History:  Procedure Laterality Date  . CESAREAN SECTION  03/25/1998  . TONSILLECTOMY    . WISDOM TOOTH EXTRACTION      Family History  Problem Relation Age of Onset  . Asthma Paternal Grandmother   . Multiple sclerosis Mother   . Diabetes Father   . Kidney failure Father   . Pancreatic disease Father   . Hypertension Father   . Heart attack Father   . Cancer Neg Hx   . Colon cancer Neg Hx   . Esophageal cancer Neg Hx   . Rectal cancer Neg Hx   . Stomach cancer  Neg Hx     Allergies  Allergen Reactions  . Cat Hair Extract Hives    Cat hair  . Dog Epithelium Hives  . Dust Mite Extract     Lips swell    Current Outpatient Medications on File Prior to Visit  Medication Sig Dispense Refill  . clotrimazole-betamethasone (LOTRISONE) cream Apply 1 application topically 2 (two) times daily. 30 g 0  . EPINEPHrine 0.3 mg/0.3 mL IJ SOAJ injection Inject 0.3 mLs (0.3 mg total) into the muscle as needed for anaphylaxis. 1 each 1  . imiquimod (ALDARA) 5 % cream Apply topically 3 (three) times a week. 12 each 0  . levocetirizine (XYZAL) 5 MG tablet Take 1 tablet (5 mg total) by mouth every evening. 30 tablet 2  . pantoprazole (PROTONIX) 40 MG tablet Take 1 tablet (40 mg total) by mouth 2 (two) times daily before a meal. 60 tablet 4  . propranolol ER (INDERAL LA) 60 MG 24 hr capsule Take 1 capsule (60 mg total) by mouth  daily. 30 capsule 1  . triamcinolone ointment (KENALOG) 0.5 % Apply 1 application topically 2 (two) times daily. 30 g 5   No current facility-administered medications on file prior to visit.    BP 109/73 (BP Location: Right Arm, Patient Position: Sitting, Cuff Size: Large)   Pulse 67   Temp (!) 96.6 F (35.9 C) (Temporal)   Resp 16   Ht 5\' 7"  (1.702 m)   Wt 192 lb (87.1 kg)   SpO2 99%   BMI 30.07 kg/m       Objective:   Physical Exam Constitutional:      Appearance: Normal appearance.  Genitourinary:    Comments: + ulcer approximately 1/2 cm by 1cm left perineum/buttock Skin:    Comments: Hyperpigmented macular rash noted on legs, trunk  Neurological:     Mental Status: She is alert.  Psychiatric:        Mood and Affect: Affect is flat.           Assessment & Plan:  Vulvar lesion- suspect hsv2, culture swab will be sent to lab. Will treat empirically with valtrex.  STD screening- check HSV IgG/IgM.  GC chlamydia, trich screening.  HIV, RPR.  Pruritis/skin rash- will rx with atarax prn, betamethasone cream. I will ask our referral coordinator to check status of her referral to allergy.

## 2019-08-18 LAB — HSV DNA BY PCR (REFERENCE LAB)
HSV 1 DNA: NOT DETECTED
HSV 2 DNA: DETECTED — AB

## 2019-08-19 ENCOUNTER — Encounter: Payer: Self-pay | Admitting: Family

## 2019-08-19 LAB — RPR: RPR Ser Ql: NONREACTIVE

## 2019-08-19 LAB — CERVICOVAGINAL ANCILLARY ONLY
Chlamydia: NEGATIVE
Comment: NEGATIVE
Comment: NORMAL
Neisseria Gonorrhea: NEGATIVE

## 2019-08-19 LAB — HIV ANTIBODY (ROUTINE TESTING W REFLEX): HIV 1&2 Ab, 4th Generation: NONREACTIVE

## 2019-08-19 LAB — HSV 2 ANTIBODY, IGG: HSV 2 Glycoprotein G Ab, IgG: 12 index — ABNORMAL HIGH

## 2019-08-22 ENCOUNTER — Telehealth: Payer: Self-pay | Admitting: Family

## 2019-08-22 NOTE — Telephone Encounter (Signed)
Please advise pt that lab work shows that she has herpes type 2.  This does not appear to be a new infection.   Other std testing is negative.

## 2019-08-23 NOTE — Telephone Encounter (Signed)
Lvm for patient to call office back about her results

## 2019-08-23 NOTE — Telephone Encounter (Signed)
Called again no answer, left another message.

## 2019-08-26 NOTE — Telephone Encounter (Signed)
Called again today,lvm. No Mychart set up

## 2019-08-27 NOTE — Telephone Encounter (Signed)
Advised patient of results,she reports she was aware of having herpes 2.

## 2019-11-17 IMAGING — CT CT ABD-PELV W/ CM
1 series · 1 of 1 positions shown · IV contrast (iopamidol)
Comparison: None.

CLINICAL DATA: Right-sided abdominal pain for several hours

EXAM:
CT ABDOMEN AND PELVIS WITH CONTRAST
TECHNIQUE: Multidetector CT imaging of the abdomen and pelvis was performed
using the standard protocol following bolus administration of
intravenous contrast.
CONTRAST:  100mL [id number]-CCC IOPAMIDOL (B3O1QK-CCC) INJECTION 61%

[Series 1: topogram 0.6 t20f · coronal · 1.00mm/px · 1 of 1 slices shown]
[im 1/1]
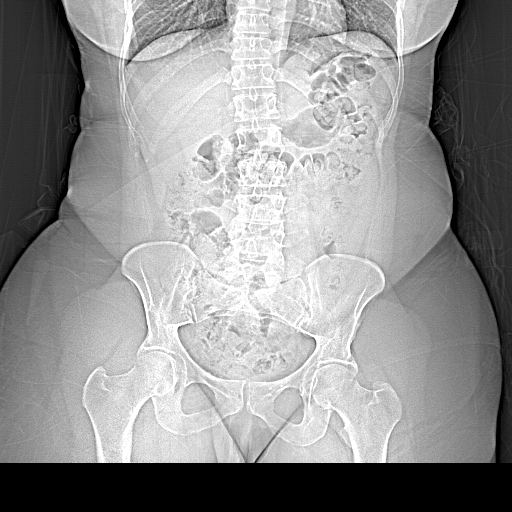

[1 of 1 positions shown; findings below may reference images not displayed]

FINDINGS: Lower chest: No acute abnormality.

Hepatobiliary: The liver is within normal limits. The gallbladder is
well distended with dependent densities poorly calcified consistent
with small stones.

Pancreas: Unremarkable. No pancreatic ductal dilatation or
surrounding inflammatory changes.

Spleen: Normal in size without focal abnormality.

Adrenals/Urinary Tract: The adrenal glands are within normal limits.
The left kidney is unremarkable. The right kidney demonstrates
delayed enhancement and hydronephrosis as well as hydroureter. This
extends inferiorly to the level of the right distal ureter. A 3 mm
obstructing stone is noted at that level best seen on coronal image
number 45 of series 5. No other calculi are identified. The bladder
is decompressed.

Stomach/Bowel: The appendix is within normal limits. The large and
small bowel are unremarkable. The stomach is within normal limits.

Vascular/Lymphatic: No significant vascular findings are present. No
enlarged abdominal or pelvic lymph nodes.

Reproductive: Uterus and bilateral adnexa are unremarkable.

Other: No abdominal wall hernia or abnormality. No abdominopelvic
ascites.

Musculoskeletal: No acute or significant osseous findings.
IMPRESSION: 3 mm distal right ureteral stone with hydronephrosis and
hydroureter.

Cholelithiasis without complicating factors.

No other focal abnormality is noted.

## 2020-02-07 ENCOUNTER — Encounter: Payer: Self-pay | Admitting: Family Medicine

## 2020-02-07 ENCOUNTER — Other Ambulatory Visit: Payer: Self-pay

## 2020-02-07 ENCOUNTER — Ambulatory Visit: Payer: BC Managed Care – PPO | Admitting: Family Medicine

## 2020-02-07 ENCOUNTER — Other Ambulatory Visit (HOSPITAL_COMMUNITY)
Admission: RE | Admit: 2020-02-07 | Discharge: 2020-02-07 | Disposition: A | Discharge status: Home / Self Care | Payer: BC Managed Care – PPO | Source: Ambulatory Visit | Attending: Family Medicine | Admitting: Family Medicine

## 2020-02-07 VITALS — BP 120/78 | HR 74 | Temp 98.2°F | Ht 66.0 in | Wt 188.0 lb

## 2020-02-07 DIAGNOSIS — Z7251 High risk heterosexual behavior: Secondary | ICD-10-CM | POA: Diagnosis not present

## 2020-02-07 DIAGNOSIS — T7840XD Allergy, unspecified, subsequent encounter: Secondary | ICD-10-CM | POA: Diagnosis not present

## 2020-02-07 DIAGNOSIS — Z202 Contact with and (suspected) exposure to infections with a predominantly sexual mode of transmission: Secondary | ICD-10-CM | POA: Insufficient documentation

## 2020-02-07 DIAGNOSIS — G4700 Insomnia, unspecified: Secondary | ICD-10-CM | POA: Diagnosis not present

## 2020-02-07 LAB — URINALYSIS
Bilirubin Urine: NEGATIVE
Hgb urine dipstick: NEGATIVE
Ketones, ur: NEGATIVE
Leukocytes,Ua: NEGATIVE
Nitrite: NEGATIVE
Specific Gravity, Urine: 1.025 (ref 1.000–1.030)
Total Protein, Urine: NEGATIVE
Urine Glucose: NEGATIVE
Urobilinogen, UA: 1 (ref 0.0–1.0)
pH: 6 (ref 5.0–8.0)

## 2020-02-07 MED ORDER — LEVOCETIRIZINE DIHYDROCHLORIDE 5 MG PO TABS: 5.0000 mg | ORAL_TABLET | Freq: Every evening | ORAL | 6 refills | Status: DC

## 2020-02-07 MED ORDER — PANTOPRAZOLE SODIUM 40 MG PO TBEC: 40.0000 mg | DELAYED_RELEASE_TABLET | Freq: Two times a day (BID) | ORAL | 6 refills | Status: DC

## 2020-02-07 MED ORDER — AZITHROMYCIN 250 MG PO TABS
1000.0000 mg | ORAL_TABLET | Freq: Every day | ORAL | Status: DC
  Administered 2020-02-07: 1000 mg via ORAL

## 2020-02-07 MED ORDER — CEFTRIAXONE SODIUM 500 MG IJ SOLR
500.0000 mg | Freq: Once | INTRAMUSCULAR | Status: AC
  Administered 2020-02-07: 500 mg via INTRAMUSCULAR

## 2020-02-07 MED ORDER — TRAZODONE HCL 50 MG PO TABS: 25.0000 mg | ORAL_TABLET | Freq: Every evening | ORAL | 3 refills | Status: DC | PRN

## 2020-02-07 NOTE — Patient Instructions (Signed)
Give [place] 2-3 business days to get the results of your labs back.   No unprotected intercourse for now.  Sleep Hygiene Tips:  Do not watch TV or look at screens within 1 hour of going to bed. If you do, make sure there is a blue light filter (nighttime mode) involved.  Try to go to bed around the same time every night. Wake up at the same time within 1 hour of regular time. Ex: If you wake up at 7 AM for work, do not sleep past 8 AM on days that you don't work.  Do not drink alcohol before bedtime.  Do not consume caffeine-containing beverages after noon or within 9 hours of intended bedtime.  Get regular exercise/physical activity in your life, but not within 2 hours of planned bedtime.  Do not take naps.   Do not eat within 2 hours of planned bedtime.  Melatonin, 3-5 mg 30-60 minutes before planned bedtime may be helpful.   The bed should be for sleep or sex only. If after 20-30 minutes you are unable to fall asleep, get up and do something relaxing. Do this until you feel ready to go to sleep again.   Sleep is important to [place] all. Getting good sleep is imperative to adequate functioning during the day. Work with our counselors who are trained to help people obtain quality sleep. Call [phone] to schedule an appointment or if you are curious about insurance coverage/cost.  Aim to do some physical exertion for 150 minutes per week. This is typically divided into 5 days per week, 30 minutes per day. The activity should be enough to get your heart rate up. Anything is better than nothing if you have time constraints.  Let us know if you need anything.

## 2020-02-07 NOTE — Addendum Note (Signed)
Addended by: Sharon Seller B on: 02/07/2020 02:25 PM   Modules accepted: Orders

## 2020-02-07 NOTE — Progress Notes (Signed)
Chief Complaint  Patient presents with  . Exposure to STD  . Insomnia    Subjective: Patient is a 44 y.o. female here for possible STI exposure.  Pt had unprotected sex, started having burning with urination for past week. No skin changes. No d/c, fevers, N/V, abd pain, or blood.   On 6/19, reports she got her 1st Moderna shot. Since then, she has not been able to sleep. She reports high stress, though no changes after this date. She will have racing thoughts preventing her from quickly falling asleep and staying asleep. She does not feel refreshed. Has not tried anything at home.   Past Medical History:  Diagnosis Date  . Genital warts   . GERD (gastroesophageal reflux disease)     Objective: BP 120/78 (BP Location: Left Arm, Patient Position: Sitting, Cuff Size: Normal)   Pulse 74   Temp 98.2 F (36.8 C) (Temporal)   Ht 5\' 6"  (1.676 m)   Wt 188 lb (85.3 kg)   SpO2 99%   BMI 30.34 kg/m  General: Awake, appears stated age Heart: RRR Lungs: CTAB, no rales, wheezes or rhonchi. No accessory muscle use MSK: No flank pain Abd: BS+, S, NT, ND Psych: Age appropriate judgment and insight, normal affect and mood  Assessment and Plan: Insomnia, unspecified type - Plan: traZODone (DESYREL) 50 MG tablet  Unprotected sex - Plan: HIV Antibody (routine testing w rflx), Urine cytology ancillary only([place]), Urine Culture, Urinalysis  Allergy, subsequent encounter - Plan: levocetirizine (XYZAL) 5 MG tablet  1. Sleep hygiene and CBT resources provided. Trazodone prn. F/u in 6 weeks if no better. 2. Ck above. Empirically tx for GC/chlamydia. Practice safe sex.  The patient voiced understanding and agreement to the plan.  [place], DO 02/07/20  1:54 PM

## 2020-02-09 LAB — URINE CULTURE
MICRO NUMBER:: 10663186
SPECIMEN QUALITY:: ADEQUATE

## 2020-02-09 LAB — HIV ANTIBODY (ROUTINE TESTING W REFLEX): HIV 1&2 Ab, 4th Generation: NONREACTIVE

## 2020-02-12 ENCOUNTER — Other Ambulatory Visit: Payer: Self-pay | Admitting: Family Medicine

## 2020-02-12 LAB — URINE CYTOLOGY ANCILLARY ONLY
Bacterial Vaginitis-Urine: NEGATIVE
Candida Urine: POSITIVE — AB
Chlamydia: NEGATIVE
Comment: NEGATIVE
Comment: NEGATIVE
Comment: NORMAL
Neisseria Gonorrhea: NEGATIVE
Trichomonas: NEGATIVE

## 2020-02-12 MED ORDER — FLUCONAZOLE 150 MG PO TABS: ORAL_TABLET | ORAL | 0 refills | Status: DC

## 2020-02-23 ENCOUNTER — Emergency Department (HOSPITAL_BASED_OUTPATIENT_CLINIC_OR_DEPARTMENT_OTHER)
Admission: EM | Admit: 2020-02-23 | Discharge: 2020-02-23 | Disposition: A | Discharge status: Home / Self Care | Payer: BC Managed Care – PPO | Attending: Emergency Medicine | Admitting: Emergency Medicine

## 2020-02-23 ENCOUNTER — Other Ambulatory Visit: Payer: Self-pay

## 2020-02-23 ENCOUNTER — Encounter (HOSPITAL_BASED_OUTPATIENT_CLINIC_OR_DEPARTMENT_OTHER): Payer: Self-pay | Admitting: Emergency Medicine

## 2020-02-23 ENCOUNTER — Emergency Department (HOSPITAL_BASED_OUTPATIENT_CLINIC_OR_DEPARTMENT_OTHER): Payer: BC Managed Care – PPO

## 2020-02-23 DIAGNOSIS — M79644 Pain in right finger(s): Secondary | ICD-10-CM

## 2020-02-23 NOTE — ED Triage Notes (Signed)
Pt reports right hand injury caused by closing door last week. Pt reports that she felt a pop sensation in her right thumb area.

## 2020-02-23 NOTE — Discharge Instructions (Addendum)
As discussed, your x-ray was negative for any broken bones. I have placed you in a splint for comfort. I suspect your symptoms are related to a trigger finger. I have included information on it. You may take over the counter ibuprofen or tylenol as needed for pain. I have included the number of the orthopedic surgeon. Call tomorrow to schedule an appointment for further evaluation. Return to the ER for new or worsening symptoms.

## 2020-02-23 NOTE — ED Notes (Signed)
Was at work, when a box hit here base of rt thumb, then at home hit hand again, presents with pain, and states she feels a pop sensation at the rt thumb, also having a lot of numbness of rt thumb

## 2020-02-23 NOTE — ED Provider Notes (Signed)
[facility] EM[facility]GENC[facility] DEPARTMENT Provider Note   CSN: [id number] Arrival date & time: 02/23/20  1506     History Chief Complaint  Patient presents with  . Hand Injury    April Ho is a 44 y.o. female with a past medical history significant for GERD who presents to the ED due to right thumb pain that has been persistent since last week.  Patient states first she hit the thenar eminence of her right thumb on cardboard and then again on a brick wall causing worsening pain. Patient notes whenever she bends her right thumb it gets "stuck" in a flexed position. She admits to pain worse with movement. She has been taking tylenol as needed for pain with moderate relief. No previous injury to right thumb. Denies numbness/tingling.   History obtained from patient and past medical records. No interpreter used during encounter.      Past Medical History:  Diagnosis Date  . Genital warts   . GERD (gastroesophageal reflux disease)     Patient Active Problem List   Diagnosis Date Noted  . Genital warts 03/22/2018    Past Surgical History:  Procedure Laterality Date  . CESAREAN SECTION  03/25/1998  . TONSILLECTOMY    . WISDOM TOOTH EXTRACTION       OB History   No obstetric history on file.     Family History  Problem Relation Age of Onset  . Asthma Paternal Grandmother   . Multiple sclerosis Mother   . Diabetes Father   . Kidney failure Father   . Pancreatic disease Father   . Hypertension Father   . Heart attack Father   . Cancer Neg Hx   . Colon cancer Neg Hx   . Esophageal cancer Neg Hx   . Rectal cancer Neg Hx   . Stomach cancer Neg Hx     Social History   Tobacco Use  . Smoking status: Never Smoker  . Smokeless tobacco: Never Used  Vaping Use  . Vaping Use: Never used  Substance Use Topics  . Alcohol use: No  . Drug use: No    Home Medications Prior to Admission medications   Medication Sig Start Date End Date Taking? Authorizing Provider    EPINEPHrine 0.3 mg/0.3 mL IJ SOAJ injection Inject 0.3 mLs (0.3 mg total) into the muscle as needed for anaphylaxis. Patient not taking: Reported on 02/07/2020 01/29/19   Shelda Pal, DO  fluconazole (DIFLUCAN) 150 MG tablet Take 1 tab, repeat in 48 hours if no improvement. 02/12/20   Shelda Pal, DO  levocetirizine (XYZAL) 5 MG tablet Take 1 tablet (5 mg total) by mouth every evening. 02/07/20   Shelda Pal, DO  pantoprazole (PROTONIX) 40 MG tablet Take 1 tablet (40 mg total) by mouth 2 (two) times daily before a meal. 02/07/20   Wendling, Crosby Oyster, DO  traZODone (DESYREL) 50 MG tablet Take 0.5-1 tablets (25-50 mg total) by mouth at bedtime as needed for sleep. 02/07/20   Shelda Pal, DO    Allergies    Cat hair extract, Dog epithelium, and Dust mite extract  Review of Systems   Review of Systems  Musculoskeletal: Positive for arthralgias (right thumb).  Skin: Negative for color change and wound.  Neurological: Negative for numbness.  All other systems reviewed and are negative.   Physical Exam Updated Vital Signs BP 125/85 (BP Location: Left Arm)   Pulse 76   Temp 98.8 F (37.1 C) (Oral)   Resp 18  Ht 5\' 6"  (1.676 m)   Wt 85.3 kg   SpO2 100%   BMI 30.34 kg/m   Physical Exam Vitals and nursing note reviewed.  Constitutional:      General: She is not in acute distress.    Appearance: She is not ill-appearing.  HENT:     Head: Normocephalic.  Eyes:     Pupils: Pupils are equal, round, and reactive to light.  Cardiovascular:     Rate and Rhythm: Normal rate and regular rhythm.     Pulses: Normal pulses.     Heart sounds: Normal heart sounds. No murmur heard.  No friction rub. No gallop.   Pulmonary:     Effort: Pulmonary effort is normal.     Breath sounds: Normal breath sounds.  Abdominal:     General: Abdomen is flat. There is no distension.     Palpations: Abdomen is soft.     Tenderness: There is no abdominal  tenderness. There is no guarding or rebound.  Musculoskeletal:     Cervical back: Neck supple.     Comments: Tenderness over the thenar eminence of right thumb.  Mild snuffbox tenderness. When flexed, finger stays in locked position. No erythema, edema, or warmth. Capillary refill <2. Full ROM of all other fingers. Full ROM of right wrist. Radial pulse intact.   Skin:    General: Skin is warm and dry.  Neurological:     General: No focal deficit present.     Mental Status: She is alert.  Psychiatric:        Mood and Affect: Mood normal.        Behavior: Behavior normal.     ED Results / Procedures / Treatments   Labs (all labs ordered are listed, but only abnormal results are displayed) Labs Reviewed - No data to display  EKG None  Radiology DG Finger Thumb Right  Result Date: 02/23/2020 CLINICAL DATA:  Right thumb pain following an injury 9 days ago. EXAM: RIGHT THUMB 2+V COMPARISON:  None. FINDINGS: There is no evidence of fracture or dislocation. There is no evidence of arthropathy or other focal bone abnormality. Soft tissues are unremarkable. IMPRESSION: Normal examination. Electronically Signed   By: Claudie Revering M.D.   On: 02/23/2020 16:27    Procedures Procedures (including critical care time)  Medications Ordered in ED Medications - No data to display  ED Course  I have reviewed the triage vital signs and the nursing notes.  Pertinent labs & imaging results that were available during my care of the patient were reviewed by me and considered in my medical decision making (see chart for details).    MDM Rules/Calculators/A&P                         15-year-old female presents to the ED due to right thumb pain and feelings like her thumb is getting "stuck" in a locked position for the past week.  Upon arrival, vitals all within normal limits.  Patient in no acute distress and nontoxic appearing.  Physical exam significant for tenderness over the thenar eminence of  right thumb.  Mild snuffbox tenderness. When flexed, finger stays in locked position. No erythema, edema, or warmth. Capillary refill <2. Full ROM of all other fingers. Full ROM of right wrist. Radial pulse intact.  Suspect symptoms related to trigger finger; however, will obtain x-ray to rule out bony fractures giving known injury to right thumb. No signs of infection on  exam.   X-ray personally reviewed which is negative for any bony fractures or acute abnormalities. Suspect symptoms related to trigger finger. Will place patient in thumb spica given mild snuffbox tenderness. Orthopedic number given to patient at discharge.  Instructed patient to call tomorrow to schedule point for further evaluation. Strict ED precautions discussed with patient. Patient states understanding and agrees to plan. Patient discharged home in no acute distress and stable vitals  Final Clinical Impression(s) / ED Diagnoses Final diagnoses:  Pain of right thumb    Rx / DC Orders ED Discharge Orders    None       Karie Kirks 02/23/20 1641    Blanchie Dessert, MD 02/23/20 1658

## 2020-03-01 ENCOUNTER — Encounter (HOSPITAL_BASED_OUTPATIENT_CLINIC_OR_DEPARTMENT_OTHER): Payer: Self-pay | Admitting: *Deleted

## 2020-03-01 ENCOUNTER — Other Ambulatory Visit: Payer: Self-pay

## 2020-03-01 ENCOUNTER — Emergency Department (HOSPITAL_BASED_OUTPATIENT_CLINIC_OR_DEPARTMENT_OTHER)
Admission: EM | Admit: 2020-03-01 | Discharge: 2020-03-01 | Disposition: A | Discharge status: Home / Self Care | Payer: BC Managed Care – PPO | Attending: Emergency Medicine | Admitting: Emergency Medicine

## 2020-03-01 DIAGNOSIS — M940 Chondrocostal junction syndrome [Tietze]: Secondary | ICD-10-CM | POA: Diagnosis not present

## 2020-03-01 MED ORDER — IBUPROFEN 800 MG PO TABS: 800.0000 mg | ORAL_TABLET | Freq: Three times a day (TID) | ORAL | 0 refills | Status: DC | PRN

## 2020-03-01 NOTE — ED Triage Notes (Signed)
Reports left breast pain, point tenderness x 2 weeks. Denies other symptoms. No obvious knot noted.

## 2020-03-01 NOTE — Discharge Instructions (Addendum)
-  Take ibuprofen for pain as needed. -If symptoms worsen, schedule an appointment with your primary care physician.

## 2020-03-01 NOTE — ED Provider Notes (Signed)
[facility] EMERGENCY DEPARTMENT Provider Note   CSN: [id number] Arrival date & time: 03/01/20  0230     History Chief Complaint  Patient presents with  . Breast Pain    April Ho is a 44 y.o. female.  April Ho is 44yo female with GERD who presents to ED w/ 2wk history of chest pain with arm movement. Reports non-radiating, mid-sternal CP that started 2 weeks ago. It does not hurt at rest, but only when she moves her L arm. 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 She says Tylenol does not alleviate pain. Denies SOB, edema, prior cardiac hx.      Past Medical History:  Diagnosis Date  . Genital warts   . GERD (gastroesophageal reflux disease)     Patient Active Problem List   Diagnosis Date Noted  . Genital warts 03/22/2018   Past Surgical History:  Procedure Laterality Date  . CESAREAN SECTION  03/25/1998  . TONSILLECTOMY    . WISDOM TOOTH EXTRACTION      OB History   No obstetric history on file.    Family History  Problem Relation Age of Onset  . Asthma Paternal Grandmother   . Multiple sclerosis Mother   . Diabetes Father   . Kidney failure Father   . Pancreatic disease Father   . Hypertension Father   . Heart attack Father   . Cancer Neg Hx   . Colon cancer Neg Hx   . Esophageal cancer Neg Hx   . Rectal cancer Neg Hx   . Stomach cancer Neg Hx    Social History   Tobacco Use  . Smoking status: Never Smoker  . Smokeless tobacco: Never Used  Vaping Use  . Vaping Use: Never used  Substance Use Topics  . Alcohol use: No  . Drug use: No   Home Medications Prior to Admission medications   Medication Sig Start Date End Date Taking? Authorizing Provider  EPINEPHrine 0.3 mg/0.3 mL IJ SOAJ injection Inject 0.3 mLs (0.3 mg total) into the muscle as needed for anaphylaxis. Patient not taking: Reported on 02/07/2020 01/29/19   Shelda Pal, DO  fluconazole (DIFLUCAN) 150 MG tablet Take 1 tab,  repeat in 48 hours if no improvement. 02/12/20   Shelda Pal, DO  levocetirizine (XYZAL) 5 MG tablet Take 1 tablet (5 mg total) by mouth every evening. 02/07/20   Shelda Pal, DO  pantoprazole (PROTONIX) 40 MG tablet Take 1 tablet (40 mg total) by mouth 2 (two) times daily before a meal. 02/07/20   Wendling, Crosby Oyster, DO  traZODone (DESYREL) 50 MG tablet Take 0.5-1 tablets (25-50 mg total) by mouth at bedtime as needed for sleep. 02/07/20   Shelda Pal, DO    Allergies    Cat hair extract, Dog epithelium, and Dust mite extract  Review of Systems   Review of Systems  All other systems reviewed and are negative.   Physical Exam Updated Vital Signs BP (!) 121/87 (BP Location: Right Arm)   Pulse 63   Temp 97.8 F (36.6 C) (Oral)   Resp 18   Ht 5\' 6"  (1.676 m)   Wt 85.3 kg   SpO2 100%   BMI 30.34 kg/m   Physical Exam Exam conducted with a chaperone present.  Constitutional:      General: She is awake. She is not in acute distress.    Appearance: She is not ill-appearing.  HENT:  Head: Normocephalic and atraumatic.  Eyes:     General: Lids are normal.     Conjunctiva/sclera: Conjunctivae normal.  Cardiovascular:     Rate and Rhythm: Normal rate and regular rhythm.     Pulses: Normal pulses.     Heart sounds: Normal heart sounds.  Pulmonary:     Effort: Pulmonary effort is normal.     Breath sounds: Normal breath sounds.  Chest:       Comments: Mildly tender to mid-sternal palpation. Abdominal:     General: Bowel sounds are normal. There is no distension.     Palpations: Abdomen is soft.     Tenderness: There is no abdominal tenderness.  Musculoskeletal:     Right lower leg: No edema.     Left lower leg: No edema.  Neurological:     Mental Status: She is alert and oriented to person, place, and time.  Psychiatric:        Speech: Speech normal.        Behavior: Behavior is cooperative.     ED Results / Procedures / Treatments    Labs (all labs ordered are listed, but only abnormal results are displayed) Labs Reviewed - No data to display  EKG None  Radiology No results found.  Procedures Procedures (including critical care time)  Medications Ordered in ED Medications - No data to display  ED Course  I have reviewed the triage vital signs and the nursing notes.  Pertinent labs & imaging results that were available during my care of the patient were reviewed by me and considered in my medical decision making (see chart for details).  Clinical Course as of Mar 01 322  Sun Mar 01, 2020  0251 BP(!): 121/87 [PB]    Clinical Course User Index [PB] Sanjuan Dame, MD   MDM Rules/Calculators/A&P  Patient is 44yo female with GERD who presents with 2 week history of mid-sternal CP w/ L arm movement. On exam, patient HDS, mildly tender to palpation of sternum, reports pain with adduction of L shoulder. No hx of trauma. Given pain with movement and tenderness to palpation, discussed with patient pain is likely 2/2 inflammation of cartilage. Encouraged patient to take ibuprofen as needed for the pain. Patient is stable for d/c.                        Final Clinical Impression(s) / ED Diagnoses Final diagnoses:  Costochondritis    Rx / DC Orders ED Discharge Orders         Ordered    ibuprofen (ADVIL) 800 MG tablet  Every 8 hours PRN     Discontinue  Reprint     03/01/20 0417           Sanjuan Dame, MD 03/01/20 9518    Fatima Blank, MD 03/01/20 2328

## 2020-03-06 ENCOUNTER — Telehealth: Payer: Self-pay | Admitting: Family Medicine

## 2020-03-06 NOTE — Telephone Encounter (Signed)
Patient would need to schedule an ER followup with Dr. Nani Ravens

## 2020-03-06 NOTE — Telephone Encounter (Signed)
Called left message to call back 

## 2020-03-06 NOTE — Telephone Encounter (Signed)
New message:   Pt states she had a visit to the ER and would like to discuss it with the MA. Please advise.

## 2020-03-06 NOTE — Telephone Encounter (Signed)
The patient called back to go over her ER visit. She states they stated she has inflammation in her chest, they gave her ibuprofen. She states her chest hurts when her arm moves. She thought they should have given her an antibiotic. Offered her a ER followup visit with PCP but she declined due to not being able to take any more time off from work. She states her next OV with PCP is on 03/20/2020.

## 2020-03-06 NOTE — Telephone Encounter (Signed)
New message:   Pt states she would just like a call back and does not  Have the time off work to come in for an appt. Please advise.

## 2020-03-07 NOTE — Telephone Encounter (Signed)
Can't rx abx based on ER exam. Can do E visit if she is still having issues. Ty.

## 2020-03-09 NOTE — Telephone Encounter (Signed)
Called left message to call back 

## 2020-03-09 NOTE — Telephone Encounter (Signed)
Called informed the patient of PCP instructions. 

## 2020-03-09 NOTE — Telephone Encounter (Signed)
Scheduled video visit for 03/11/20

## 2020-03-11 ENCOUNTER — Other Ambulatory Visit: Payer: Self-pay

## 2020-03-11 ENCOUNTER — Encounter: Payer: Self-pay | Admitting: Family Medicine

## 2020-03-11 ENCOUNTER — Telehealth (INDEPENDENT_AMBULATORY_CARE_PROVIDER_SITE_OTHER): Payer: BC Managed Care – PPO | Admitting: Family Medicine

## 2020-03-11 VITALS — Temp 95.0°F

## 2020-03-11 DIAGNOSIS — R634 Abnormal weight loss: Secondary | ICD-10-CM | POA: Diagnosis not present

## 2020-03-11 DIAGNOSIS — R0789 Other chest pain: Secondary | ICD-10-CM | POA: Diagnosis not present

## 2020-03-11 DIAGNOSIS — R21 Rash and other nonspecific skin eruption: Secondary | ICD-10-CM

## 2020-03-11 MED ORDER — PREDNISONE 20 MG PO TABS: 40.0000 mg | ORAL_TABLET | Freq: Every day | ORAL | 0 refills | Status: AC

## 2020-03-11 MED ORDER — TRIAMCINOLONE ACETONIDE 0.025 % EX CREA: 1.0000 "application " | TOPICAL_CREAM | Freq: Two times a day (BID) | CUTANEOUS | 0 refills | Status: DC

## 2020-03-11 NOTE — Progress Notes (Signed)
Chief Complaint  Patient presents with  . Follow-up    chest inflammation    Subjective: Patient is a 44 y.o. female here for f/u. Due to COVID-19 pandemic, we are interacting via telephone. I verified patient's ID using 2 identifiers. Patient agreed to proceed with visit via this method. Patient is in a parked car, I am at office. Patient and I are present for visit.   Duration of issue: 2 weeks; just off from center Quality: sharp Palliation: avoiding lifting Provocation: lifting Severity: bothersome only when she lifts, 0/10 currently Radiation: none Duration of chest pain: several minutes Associated symptoms: No SOB, bruising, fevers, cough Cardiac history: none Family heart history: Dad w heart disease Smoker? No  Poor appetite and wt loss for past 2 weeks. Unsure how much weight she has lost, but notes other ppl have noticed. No urinary complaints, diarrhea, cough, sob.   +itchy rash on face. Cream that was given to her in past has been helpful, does not remember what it is.   Past Medical History:  Diagnosis Date  . Genital warts   . GERD (gastroesophageal reflux disease)     Objective: Temp (!) 95 F (35 C) (Temporal)  No conversational dyspnea Age appropriate judgment and insight Nml affect and mood  Assessment and Plan: Atypical chest pain - Plan: predniSONE (DELTASONE) 20 MG tablet  Weight loss  Rash - Plan: triamcinolone (KENALOG) 0.025 % cream  1. Pred burst, would consider PT vs counterstrain in future.  2. Unsure how much she weighs. Nothing acute at this point. Will see if pred helps. She has f/u next week, can more thoroughly eval.  3. After pred burst, use bid cream on face. Sounds eczematous.  Total time: 14 min The patient voiced understanding and agreement to the plan.  [place], DO 03/11/20   11:53 AM

## 2020-03-20 ENCOUNTER — Ambulatory Visit: Payer: BC Managed Care – PPO | Admitting: Family Medicine

## 2020-03-27 ENCOUNTER — Other Ambulatory Visit: Payer: Self-pay | Admitting: Allergy & Immunology

## 2020-03-30 ENCOUNTER — Other Ambulatory Visit: Payer: Self-pay

## 2020-04-03 ENCOUNTER — Ambulatory Visit: Payer: BC Managed Care – PPO | Admitting: Family Medicine

## 2020-04-17 ENCOUNTER — Ambulatory Visit: Payer: BC Managed Care – PPO | Admitting: Family Medicine

## 2020-05-07 ENCOUNTER — Other Ambulatory Visit: Payer: Self-pay

## 2020-05-07 ENCOUNTER — Encounter (HOSPITAL_BASED_OUTPATIENT_CLINIC_OR_DEPARTMENT_OTHER): Payer: Self-pay | Admitting: *Deleted

## 2020-05-07 DIAGNOSIS — T783XXA Angioneurotic edema, initial encounter: Secondary | ICD-10-CM | POA: Insufficient documentation

## 2020-05-07 DIAGNOSIS — R220 Localized swelling, mass and lump, head: Secondary | ICD-10-CM | POA: Diagnosis present

## 2020-05-07 NOTE — ED Notes (Signed)
Patient stated " she wanted to wait for a real room, she was cold in room 13."  This writer advised patietnt would have to wait in the waiting room for a room.

## 2020-05-07 NOTE — ED Triage Notes (Signed)
Upper lip swellings since this afternoon. Hx of same.  She is not sure what she is allergic to.

## 2020-05-08 ENCOUNTER — Emergency Department (HOSPITAL_BASED_OUTPATIENT_CLINIC_OR_DEPARTMENT_OTHER)
Admission: EM | Admit: 2020-05-08 | Discharge: 2020-05-08 | Disposition: A | Discharge status: Home / Self Care | Payer: BC Managed Care – PPO | Attending: Emergency Medicine | Admitting: Emergency Medicine

## 2020-05-08 ENCOUNTER — Other Ambulatory Visit: Payer: Self-pay | Admitting: Family Medicine

## 2020-05-08 ENCOUNTER — Telehealth: Payer: Self-pay | Admitting: Family Medicine

## 2020-05-08 DIAGNOSIS — T783XXA Angioneurotic edema, initial encounter: Secondary | ICD-10-CM

## 2020-05-08 MED ORDER — DEXAMETHASONE SODIUM PHOSPHATE 10 MG/ML IJ SOLN
10.0000 mg | Freq: Once | INTRAMUSCULAR | Status: AC
  Administered 2020-05-08: 10 mg via INTRAVENOUS
  Filled 2020-05-08: qty 1

## 2020-05-08 MED ORDER — FAMOTIDINE IN NACL 20-0.9 MG/50ML-% IV SOLN
20.0000 mg | Freq: Once | INTRAVENOUS | Status: AC
  Administered 2020-05-08: 20 mg via INTRAVENOUS
  Filled 2020-05-08: qty 50

## 2020-05-08 MED ORDER — PREDNISONE 20 MG PO TABS: 40.0000 mg | ORAL_TABLET | Freq: Every day | ORAL | 0 refills | Status: DC

## 2020-05-08 MED ORDER — DIPHENHYDRAMINE HCL 50 MG/ML IJ SOLN
50.0000 mg | Freq: Once | INTRAMUSCULAR | Status: AC
  Administered 2020-05-08: 50 mg via INTRAVENOUS
  Filled 2020-05-08: qty 1

## 2020-05-08 NOTE — Telephone Encounter (Signed)
Caller April Ho  Call Back # [phone]  Pt states seen in ED last night in reference to swelling and possible allergic reaction, Hospital Follow Up made for patient on 05/11/2020 at 11:15 with PCP. Patient is requesting that Dr Nani Ravens send a medication into pharmacy b/c iv medication given in ED did not help her.  Please Advise

## 2020-05-08 NOTE — ED Notes (Signed)
Report received 

## 2020-05-08 NOTE — ED Provider Notes (Signed)
[facility] Provider Note: Georgena Spurling, MD, FACEP  CSN: [id number] MRN: [id number] ARRIVAL: 05/07/20 at 2235 ROOM: [place]  Allergic Reaction   HISTORY OF PRESENT ILLNESS  05/08/20 1:08 AM April Ho is a 44 y.o. female who is here with swelling of the upper lip since 2 PM yesterday afternoon.  She has a history of similar in the past but with an unknown trigger.  The symptoms are mild and she is having no throat swelling, difficulty breathing, itching, nausea, vomiting or diarrhea.  She rates associated pain is a 2 out of 10 and describes it as a tingling sensation.  She has taken Zyrtec without relief.  She is not on an ACE inhibitor or an angiotensin receptor blocker.   Past Medical History:  Diagnosis Date  . Genital warts   . GERD (gastroesophageal reflux disease)     Past Surgical History:  Procedure Laterality Date  . CESAREAN SECTION  03/25/1998  . TONSILLECTOMY    . WISDOM TOOTH EXTRACTION      Family History  Problem Relation Age of Onset  . Asthma Paternal Grandmother   . Multiple sclerosis Mother   . Diabetes Father   . Kidney failure Father   . Pancreatic disease Father   . Hypertension Father   . Heart attack Father   . Cancer Neg Hx   . Colon cancer Neg Hx   . Esophageal cancer Neg Hx   . Rectal cancer Neg Hx   . Stomach cancer Neg Hx     Social History   Tobacco Use  . Smoking status: Never Smoker  . Smokeless tobacco: Never Used  Vaping Use  . Vaping Use: Never used  Substance Use Topics  . Alcohol use: No  . Drug use: Yes    Types: Marijuana    Prior to Admission medications   Medication Sig Start Date End Date Taking? Authorizing Provider  cetirizine (ZYRTEC) 10 MG tablet TAKE 1 TABLET(10 MG) BY MOUTH TWICE DAILY 03/30/20   Valentina Shaggy, MD  EPINEPHrine 0.3 mg/0.3 mL IJ SOAJ injection Inject 0.3 mLs (0.3 mg total) into the muscle as needed for anaphylaxis. 01/29/19   Shelda Pal,  DO  fluconazole (DIFLUCAN) 150 MG tablet Take 1 tab, repeat in 48 hours if no improvement. 02/12/20   Shelda Pal, DO  levocetirizine (XYZAL) 5 MG tablet Take 1 tablet (5 mg total) by mouth every evening. 02/07/20   Shelda Pal, DO  pantoprazole (PROTONIX) 40 MG tablet Take 1 tablet (40 mg total) by mouth 2 (two) times daily before a meal. 02/07/20   Wendling, Crosby Oyster, DO  traZODone (DESYREL) 50 MG tablet Take 0.5-1 tablets (25-50 mg total) by mouth at bedtime as needed for sleep. 02/07/20   Shelda Pal, DO  triamcinolone (KENALOG) 0.025 % cream Apply 1 application topically 2 (two) times daily. 03/18/20   Shelda Pal, DO    Allergies Cat hair extract, Dog epithelium, and Dust mite extract   REVIEW OF SYSTEMS  Negative except as noted here or in the History of Present Illness.   PHYSICAL EXAMINATION  Initial Vital Signs Blood pressure 103/73, pulse (!) 58, temperature 98.1 F (36.7 C), temperature source Oral, resp. rate 18, height 5\' 6"  (1.676 m), weight 85.3 kg, SpO2 100 %.  Examination General: Well-developed, well-nourished female in no acute distress; appearance consistent with age of record HENT: normocephalic; atraumatic; mild edema of upper lip; no pharyngeal or tongue edema  Eyes: pupils equal, round and reactive to light; extraocular muscles intact Neck: supple Heart: regular rate and rhythm Lungs: clear to auscultation bilaterally Abdomen: soft; nondistended; nontender; bowel sounds present Extremities: No deformity; full range of motion; pulses normal Neurologic: Awake, alert and oriented; motor function intact in all extremities and symmetric; no facial droop Skin: Warm and dry Psychiatric: Normal mood and affect   RESULTS  Summary of this visit's results, reviewed and interpreted by myself:   EKG Interpretation  Date/Time:    Ventricular Rate:    PR Interval:    QRS Duration:   QT Interval:    QTC Calculation:   R  Axis:     Text Interpretation:        Laboratory Studies: No results found for this or any previous visit (from the past 24 hour(s)). Imaging Studies: No results found.  ED COURSE and MDM  Nursing notes, initial and subsequent vitals signs, including pulse oximetry, reviewed and interpreted by myself.  Vitals:   05/07/20 2240 05/07/20 2243 05/08/20 0058  BP:  (!) 136/100 103/73  Pulse:  87 (!) 58  Resp:  16 18  Temp:  98.1 F (36.7 C)   TempSrc:  Oral   SpO2:  100% 100%  Weight: 85.3 kg    Height: 5\' 6"  (1.676 m)     Medications  diphenhydrAMINE (BENADRYL) injection 50 mg (50 mg Intravenous Given 05/08/20 0136)  dexamethasone (DECADRON) injection 10 mg (10 mg Intravenous Given 05/08/20 0149)  famotidine (PEPCID) IVPB 20 mg premix ( Intravenous Stopped 05/08/20 0219)    3:16 AM Swelling improved after IV meds.  She may need to be followed up by her PCP for this recurrent angioedema.  PROCEDURES  Procedures   ED DIAGNOSES     ICD-10-CM   1. Angioedema, initial encounter  T78.Mechele Dawley, MD 05/08/20 [phone]

## 2020-05-11 ENCOUNTER — Ambulatory Visit (INDEPENDENT_AMBULATORY_CARE_PROVIDER_SITE_OTHER): Payer: BC Managed Care – PPO | Admitting: Family Medicine

## 2020-05-11 ENCOUNTER — Encounter: Payer: Self-pay | Admitting: Family Medicine

## 2020-05-11 ENCOUNTER — Other Ambulatory Visit: Payer: Self-pay

## 2020-05-11 ENCOUNTER — Telehealth: Payer: Self-pay | Admitting: Family Medicine

## 2020-05-11 VITALS — BP 108/72 | HR 50 | Temp 98.2°F | Ht 66.0 in | Wt 184.1 lb

## 2020-05-11 DIAGNOSIS — T783XXA Angioneurotic edema, initial encounter: Secondary | ICD-10-CM

## 2020-05-11 DIAGNOSIS — B3731 Acute candidiasis of vulva and vagina: Secondary | ICD-10-CM

## 2020-05-11 DIAGNOSIS — T783XXD Angioneurotic edema, subsequent encounter: Secondary | ICD-10-CM | POA: Diagnosis not present

## 2020-05-11 DIAGNOSIS — R21 Rash and other nonspecific skin eruption: Secondary | ICD-10-CM

## 2020-05-11 DIAGNOSIS — L282 Other prurigo: Secondary | ICD-10-CM

## 2020-05-11 DIAGNOSIS — L509 Urticaria, unspecified: Secondary | ICD-10-CM | POA: Diagnosis not present

## 2020-05-11 DIAGNOSIS — B373 Candidiasis of vulva and vagina: Secondary | ICD-10-CM

## 2020-05-11 MED ORDER — ALBUTEROL SULFATE HFA 108 (90 BASE) MCG/ACT IN AERS: 2.0000 | INHALATION_SPRAY | Freq: Four times a day (QID) | RESPIRATORY_TRACT | 0 refills | Status: DC | PRN

## 2020-05-11 MED ORDER — FLUCONAZOLE 150 MG PO TABS: ORAL_TABLET | ORAL | 0 refills | Status: DC

## 2020-05-11 MED ORDER — TRIAMCINOLONE ACETONIDE 0.025 % EX CREA: 1.0000 "application " | TOPICAL_CREAM | Freq: Two times a day (BID) | CUTANEOUS | 0 refills | Status: DC

## 2020-05-11 MED ORDER — PANTOPRAZOLE SODIUM 40 MG PO TBEC: 40.0000 mg | DELAYED_RELEASE_TABLET | Freq: Two times a day (BID) | ORAL | 6 refills | Status: DC

## 2020-05-11 MED ORDER — TRIAMCINOLONE ACETONIDE 0.1 % EX CREA: 1.0000 "application " | TOPICAL_CREAM | Freq: Two times a day (BID) | CUTANEOUS | 0 refills | Status: DC

## 2020-05-11 NOTE — Progress Notes (Signed)
Chief Complaint  Patient presents with  . Hospitalization Follow-up    Subjective: Patient is a 44 y.o. female here for ED f/u.  Seen last week for angioedema in ED that started 4 d ago.  She stopped taking Zyrtec. No swelling in throat. Currently doing better. Does not currently follow w an allergist.   +hx of scaly and itchy rash on both elbows. No new topicals. No hx of psoriasis. No drainage. No systemic s/s's.  States she has a yeast infection, requesting tx. No fevers, bleeding, urinary complaints.    Past Medical History:  Diagnosis Date  . Genital warts   . GERD (gastroesophageal reflux disease)     Objective: BP 108/72 (BP Location: Right Arm, Patient Position: Sitting, Cuff Size: Normal)   Pulse (!) 50   Temp 98.2 F (36.8 C) (Oral)   Ht 5\' 6"  (1.676 m)   Wt 184 lb 2 oz (83.5 kg)   SpO2 99%   BMI 29.72 kg/m  General: Awake, appears stated age Heart: RRR Lungs: CTAB, no rales, wheezes or rhonchi. No accessory muscle use Skin: Scaly region over ext elbow b/l, no erythema, fluctuance, drainage.  Psych: Age appropriate judgment and insight, normal affect and mood  Assessment and Plan: Urticaria - Plan: Ambulatory referral to Allergy  Angioedema, subsequent encounter - Plan: Ambulatory referral to Allergy  Pruritic rash  Vagina, candidiasis - Plan: fluconazole (DIFLUCAN) 150 MG tablet  Angioedema of lips, initial encounter  Rash - Plan: triamcinolone (KENALOG) 0.025 % cream  1/2. Refer allergy team 3. Triamcinolone 0.1% cream. 4. Diflucan 150 mg once 5. As above.  6. Reorder 0.025% strength for face.  F/u in 2 mo for CPE or prn The patient voiced understanding and agreement to the plan.  Hawkins, DO 05/11/20  12:52 PM

## 2020-05-11 NOTE — Telephone Encounter (Signed)
That has been taken care of

## 2020-05-11 NOTE — Patient Instructions (Signed)
If you do not hear anything about your referral in the next 1-2 weeks, call our office and ask for an update.  You can use prednisone if it gets worse again.   Use the inhaler as needed. It takes 10-15 minutes before peak action.   Let us know if you need anything.

## 2020-05-11 NOTE — Addendum Note (Signed)
Addended by: Sharon Seller B on: 05/11/2020 02:34 PM   Modules accepted: Orders

## 2020-05-11 NOTE — Telephone Encounter (Signed)
pantoprazole (PROTONIX) 40 MG tablet [[id number]]   [facility] [place], [address]  [place], [place] [address]  Phone:  [phone] Fax:  [phone]

## 2020-05-13 ENCOUNTER — Telehealth: Payer: Self-pay | Admitting: Family Medicine

## 2020-05-13 MED ORDER — FLOVENT HFA 110 MCG/ACT IN AERO: 2.0000 | INHALATION_SPRAY | Freq: Two times a day (BID) | RESPIRATORY_TRACT | 2 refills | Status: DC

## 2020-05-13 NOTE — Telephone Encounter (Signed)
Patient states medication is not working , one minute she can talk and the next minute  She loses her voice.   Patient states she would like someone to send in another medication/inhaler to her local [facility].  Patient states to make sure we sent medication the pharmacy below.   [facility] [place], [address]  [place], [place] [address]  Phone:  [phone] Fax:  [phone]  DEA #:  [id number]

## 2020-05-14 NOTE — Telephone Encounter (Signed)
Patient calling today to check status of her last message sent to provider . Patient state the  medication Gasper Sells is not working for her. Patient would like to know if she needs to stop taking inhaler since it is not working for her. Patient is requesting an antibiotic be sent to her pharmacy.

## 2020-05-22 ENCOUNTER — Other Ambulatory Visit: Payer: Self-pay

## 2020-05-22 ENCOUNTER — Telehealth (INDEPENDENT_AMBULATORY_CARE_PROVIDER_SITE_OTHER): Payer: BC Managed Care – PPO | Admitting: Family

## 2020-05-22 ENCOUNTER — Telehealth: Payer: Self-pay | Admitting: Family Medicine

## 2020-05-22 ENCOUNTER — Ambulatory Visit (HOSPITAL_BASED_OUTPATIENT_CLINIC_OR_DEPARTMENT_OTHER)
Admission: RE | Admit: 2020-05-22 | Discharge: 2020-05-22 | Disposition: A | Discharge status: Home / Self Care | Payer: BC Managed Care – PPO | Source: Ambulatory Visit | Attending: Family | Admitting: Family

## 2020-05-22 ENCOUNTER — Telehealth: Payer: Self-pay | Admitting: Family

## 2020-05-22 DIAGNOSIS — H5461 Unqualified visual loss, right eye, normal vision left eye: Secondary | ICD-10-CM | POA: Diagnosis not present

## 2020-05-22 DIAGNOSIS — K529 Noninfective gastroenteritis and colitis, unspecified: Secondary | ICD-10-CM

## 2020-05-22 MED ORDER — NAPROXEN 500 MG PO TABS
500.0000 mg | ORAL_TABLET | Freq: Two times a day (BID) | ORAL | 0 refills | Status: DC
Start: 2020-05-22 — End: 2020-06-12

## 2020-05-22 NOTE — Telephone Encounter (Signed)
We did not discuss headache at her visit. tks

## 2020-05-22 NOTE — Telephone Encounter (Signed)
Patient advised of results.

## 2020-05-22 NOTE — Progress Notes (Signed)
Virtual Visit via Video Note  I connected with April Ho on 05/22/20 at 10:40 AM EDT by a video enabled telemedicine application and verified that I am speaking with the correct person using two identifiers.  Location: Patient: home Provider: work   I discussed the limitations of evaluation and management by telemedicine and the availability of in person appointments. The patient expressed understanding and agreed to proceed. Only the patient and myself were present for today's video call.   History of Present Illness:  Patient is a 44 yr old female who presents today with several recent symptoms.     Last covid vaccine was 03/06/2020. She states that she "just hasn't felt right since my vaccines."   She reports feeling hot/cold.   Had nausea and dizziness on Thursday AM at 3:30AM.  Ate some pizza, then started feeling dizzy. She sat back down, then started vomiting.  Reports that when she returned home the vision in the right side of her eye "went out."  Came back after a minute.  She feels like the right eye is a little blurry. [facility] [address].  Has appointment at 11:15 AM today.    She denies hx of migraines.  Reports resolution of nausea.  She notes that her appetite has been poor ever since she had the covid vaccine.    Wt Readings from Last 3 Encounters:  05/11/20 184 lb 2 oz (83.5 kg)  05/07/20 188 lb 0.8 oz (85.3 kg)  03/01/20 188 lb (85.3 kg)    Past Medical History:  Diagnosis Date  . Genital warts   . GERD (gastroesophageal reflux disease)      Social History   Socioeconomic History  . Marital status: Single    Spouse name: Not on file  . Number of children: Not on file  . Years of education: Not on file  . Highest education level: Not on file  Occupational History  . Not on file  Tobacco Use  . Smoking status: Never Smoker  . Smokeless tobacco: Never Used  Vaping Use  . Vaping Use: Never used  Substance and Sexual  Activity  . Alcohol use: No  . Drug use: Yes    Types: Marijuana  . Sexual activity: Not Currently    Birth control/protection: None    Comment: depo injection Jan 2020.  Per pt no chance of preganancy  Other Topics Concern  . Not on file  Social History Narrative  . Not on file   Social Determinants of Health   Financial Resource Strain:   . Difficulty of Paying Living Expenses: Not on file  Food Insecurity:   . Worried About Charity fundraiser in the Last Year: Not on file  . Ran Out of Food in the Last Year: Not on file  Transportation Needs:   . Lack of Transportation (Medical): Not on file  . Lack of Transportation (Non-Medical): Not on file  Physical Activity:   . Days of Exercise per Week: Not on file  . Minutes of Exercise per Session: Not on file  Stress:   . Feeling of Stress : Not on file  Social Connections:   . Frequency of Communication with Friends and Family: Not on file  . Frequency of Social Gatherings with Friends and Family: Not on file  . Attends Religious Services: Not on file  . Active Member of Clubs or Organizations: Not on file  . Attends Archivist Meetings: Not on file  .  Marital Status: Not on file  Intimate Partner Violence:   . Fear of Current or Ex-Partner: Not on file  . Emotionally Abused: Not on file  . Physically Abused: Not on file  . Sexually Abused: Not on file    Past Surgical History:  Procedure Laterality Date  . CESAREAN SECTION  03/25/1998  . TONSILLECTOMY    . WISDOM TOOTH EXTRACTION      Family History  Problem Relation Age of Onset  . Asthma Paternal Grandmother   . Multiple sclerosis Mother   . Diabetes Father   . Kidney failure Father   . Pancreatic disease Father   . Hypertension Father   . Heart attack Father   . Cancer Neg Hx   . Colon cancer Neg Hx   . Esophageal cancer Neg Hx   . Rectal cancer Neg Hx   . Stomach cancer Neg Hx     Allergies  Allergen Reactions  . Cat Hair Extract Hives     Cat hair  . Dog Epithelium Hives  . Dust Mite Extract     Lips swell    Current Outpatient Medications on File Prior to Visit  Medication Sig Dispense Refill  . albuterol (VENTOLIN HFA) 108 (90 Base) MCG/ACT inhaler Inhale 2 puffs into the lungs every 6 (six) hours as needed for wheezing or shortness of breath. 8 g 0  . EPINEPHrine 0.3 mg/0.3 mL IJ SOAJ injection Inject 0.3 mLs (0.3 mg total) into the muscle as needed for anaphylaxis. 1 each 1  . fluconazole (DIFLUCAN) 150 MG tablet Take 1 tab, repeat in 48 hours if no improvement. 2 tablet 0  . fluticasone (FLOVENT HFA) 110 MCG/ACT inhaler Inhale 2 puffs into the lungs in the morning and at bedtime. 1 each 2  . traZODone (DESYREL) 50 MG tablet Take 0.5-1 tablets (25-50 mg total) by mouth at bedtime as needed for sleep. 30 tablet 3  . triamcinolone (KENALOG) 0.025 % cream Apply 1 application topically 2 (two) times daily. This is for use on your face only. 30 g 0  . triamcinolone cream (KENALOG) 0.1 % Apply 1 application topically 2 (two) times daily. 30 g 0  . pantoprazole (PROTONIX) 40 MG tablet Take 1 tablet (40 mg total) by mouth 2 (two) times daily before a meal. (Patient not taking: Reported on 05/22/2020) 60 tablet 6   No current facility-administered medications on file prior to visit.    There were no vitals taken for this visit.   Observations/Objective:   Gen: Awake, alert, no acute distress Resp: Breathing is even and non-labored Psych: calm/pleasant demeanor Neuro: Alert and Oriented x 3, + facial symmetry, speech is clear.   Assessment and Plan:  Transient loss of vision of right eye- I am glad she is seeing her eye doctor today. Need to rule out eye pathology and stroke. I have also placed an order for a CT head to rule out CVA. Another consideration would be atypical migraine, though she denies hx of migraine. She is advised to go to the ER if she develops recurrent loss of vision.    Gastroenteritis- it sounds  like she had some gastroenteritis which is improved.    I have advised her to schedule follow up with Dr. Nani Ravens next week for in person examination and follow up.   Follow Up Instructions:    I discussed the assessment and treatment plan with the patient. The patient was provided an opportunity to ask questions and all were answered. The patient  agreed with the plan and demonstrated an understanding of the instructions.   The patient was advised to call back or seek an in-person evaluation if the symptoms worsen or if the condition fails to improve as anticipated.  Nance Pear, NP

## 2020-05-22 NOTE — Telephone Encounter (Signed)
Please send in Naproxen 500 mg bid, disp 30, ref 0 and sched f/u next week w me. Ty.

## 2020-05-22 NOTE — Telephone Encounter (Signed)
Please contact pt and let her know that her CT head is negative for stroke, good news.

## 2020-05-22 NOTE — Telephone Encounter (Signed)
Caller name: Rebakah Call back number: [phone]  Patient states she would like something for her headaches. She was seeing virtually this morning.  [facility] [place], [address] Phone:  [phone]  Fax:  [phone]

## 2020-05-22 NOTE — Telephone Encounter (Signed)
I'm not sure if you discussed this with her. I can take care of if it was not mentioned. Thanks.

## 2020-05-22 NOTE — Telephone Encounter (Signed)
Sent in

## 2020-06-03 ENCOUNTER — Emergency Department (HOSPITAL_BASED_OUTPATIENT_CLINIC_OR_DEPARTMENT_OTHER)
Admission: EM | Admit: 2020-06-03 | Discharge: 2020-06-03 | Disposition: A | Discharge status: Home / Self Care | Payer: BC Managed Care – PPO | Attending: Emergency Medicine | Admitting: Emergency Medicine

## 2020-06-03 ENCOUNTER — Encounter (HOSPITAL_BASED_OUTPATIENT_CLINIC_OR_DEPARTMENT_OTHER): Payer: Self-pay | Admitting: Emergency Medicine

## 2020-06-03 ENCOUNTER — Other Ambulatory Visit: Payer: Self-pay

## 2020-06-03 DIAGNOSIS — R1084 Generalized abdominal pain: Secondary | ICD-10-CM | POA: Insufficient documentation

## 2020-06-03 DIAGNOSIS — R0602 Shortness of breath: Secondary | ICD-10-CM | POA: Diagnosis not present

## 2020-06-03 DIAGNOSIS — R112 Nausea with vomiting, unspecified: Secondary | ICD-10-CM | POA: Diagnosis present

## 2020-06-03 LAB — CBC WITH DIFFERENTIAL/PLATELET
Abs Immature Granulocytes: 0.01 10*3/uL (ref 0.00–0.07)
Basophils Absolute: 0 10*3/uL (ref 0.0–0.1)
Basophils Relative: 0 %
Eosinophils Absolute: 0 10*3/uL (ref 0.0–0.5)
Eosinophils Relative: 1 %
HCT: 43.1 % (ref 36.0–46.0)
Hemoglobin: 14.1 g/dL (ref 12.0–15.0)
Immature Granulocytes: 0 %
Lymphocytes Relative: 6 %
Lymphs Abs: 0.3 10*3/uL — ABNORMAL LOW (ref 0.7–4.0)
MCH: 32.8 pg (ref 26.0–34.0)
MCHC: 32.7 g/dL (ref 30.0–36.0)
MCV: 100.2 fL — ABNORMAL HIGH (ref 80.0–100.0)
Monocytes Absolute: 0.2 10*3/uL (ref 0.1–1.0)
Monocytes Relative: 4 %
Neutro Abs: 3.9 10*3/uL (ref 1.7–7.7)
Neutrophils Relative %: 89 %
Platelets: 270 10*3/uL (ref 150–400)
RBC: 4.3 MIL/uL (ref 3.87–5.11)
RDW: 13.2 % (ref 11.5–15.5)
WBC: 4.4 10*3/uL (ref 4.0–10.5)
nRBC: 0 % (ref 0.0–0.2)

## 2020-06-03 LAB — COMPREHENSIVE METABOLIC PANEL
ALT: 17 U/L (ref 0–44)
AST: 15 U/L (ref 15–41)
Albumin: 4.4 g/dL (ref 3.5–5.0)
Alkaline Phosphatase: 108 U/L (ref 38–126)
Anion gap: 9 (ref 5–15)
BUN: 17 mg/dL (ref 6–20)
CO2: 26 mmol/L (ref 22–32)
Calcium: 9.3 mg/dL (ref 8.9–10.3)
Chloride: 105 mmol/L (ref 98–111)
Creatinine, Ser: 0.62 mg/dL (ref 0.44–1.00)
GFR, Estimated: >60 mL/min (ref >60)
Glucose, Bld: 101 mg/dL — ABNORMAL HIGH (ref 70–99)
Potassium: 3.9 mmol/L (ref 3.5–5.1)
Sodium: 140 mmol/L (ref 135–145)
Total Bilirubin: 0.8 mg/dL (ref 0.3–1.2)
Total Protein: 8 g/dL (ref 6.5–8.1)

## 2020-06-03 LAB — URINALYSIS, ROUTINE W REFLEX MICROSCOPIC
Bilirubin Urine: NEGATIVE
Glucose, UA: NEGATIVE mg/dL
Hgb urine dipstick: NEGATIVE
Ketones, ur: 15 mg/dL — AB
Leukocytes,Ua: NEGATIVE
Nitrite: NEGATIVE
Protein, ur: NEGATIVE mg/dL
Specific Gravity, Urine: 1.025 (ref 1.005–1.030)
pH: 7 (ref 5.0–8.0)

## 2020-06-03 LAB — LIPASE, BLOOD: Lipase: 20 U/L (ref 11–51)

## 2020-06-03 LAB — PREGNANCY, URINE: Preg Test, Ur: NEGATIVE

## 2020-06-03 MED ORDER — ONDANSETRON 4 MG PO TBDP: 4.0000 mg | ORAL_TABLET | Freq: Three times a day (TID) | ORAL | 0 refills | Status: DC | PRN

## 2020-06-03 MED ORDER — ONDANSETRON HCL 4 MG/2ML IJ SOLN
4.0000 mg | Freq: Once | INTRAMUSCULAR | Status: AC
  Administered 2020-06-03: 4 mg via INTRAVENOUS
  Filled 2020-06-03: qty 2

## 2020-06-03 MED ORDER — DICYCLOMINE HCL 20 MG PO TABS: 20.0000 mg | ORAL_TABLET | Freq: Three times a day (TID) | ORAL | 0 refills | Status: DC | PRN

## 2020-06-03 MED ORDER — LACTATED RINGERS IV BOLUS
1000.0000 mL | Freq: Once | INTRAVENOUS | Status: AC
  Administered 2020-06-03: 1000 mL via INTRAVENOUS

## 2020-06-03 NOTE — ED Triage Notes (Signed)
Pt states she has had vomiting since yesterday morning around 0930  Unable to hold anything down  Denies diarrhea

## 2020-06-03 NOTE — ED Notes (Signed)
Tolerated PO intake.  Denies n/v.

## 2020-06-03 NOTE — Discharge Instructions (Signed)

## 2020-06-03 NOTE — ED Provider Notes (Signed)
Emergency Department Provider Note   I have reviewed the triage vital signs and the nursing notes.   HISTORY  Chief Complaint Emesis   HPI April Ho is a 44 y.o. female with past medical history reviewed below presents to the emergency department with vomiting, nausea, generalized abdominal discomfort over the past 12 hours.  Patient began vomiting at work yesterday and is continued vomiting through the night.  She has some generalized abdominal cramping with more focal discomfort in the epigastric region.  She is unable to hold down solids or liquids.  She is not having diarrhea or fever.  She is now experiencing chest pain or shortness of breath.  No upper respiratory infection symptoms.  No sick contacts.  No radiation of symptoms or other modifying factors.   Past Medical History:  Diagnosis Date  . Genital warts   . GERD (gastroesophageal reflux disease)     Patient Active Problem List   Diagnosis Date Noted  . Genital warts 03/22/2018    Past Surgical History:  Procedure Laterality Date  . CESAREAN SECTION  03/25/1998  . TONSILLECTOMY    . WISDOM TOOTH EXTRACTION      Allergies Cat hair extract, Dog epithelium, and Dust mite extract  Family History  Problem Relation Age of Onset  . Asthma Paternal Grandmother   . Multiple sclerosis Mother   . Diabetes Father   . Kidney failure Father   . Pancreatic disease Father   . Hypertension Father   . Heart attack Father   . Cancer Neg Hx   . Colon cancer Neg Hx   . Esophageal cancer Neg Hx   . Rectal cancer Neg Hx   . Stomach cancer Neg Hx     Social History Social History   Tobacco Use  . Smoking status: Never Smoker  . Smokeless tobacco: Never Used  Vaping Use  . Vaping Use: Never used  Substance Use Topics  . Alcohol use: No  . Drug use: Not Currently    Types: Marijuana    Review of Systems  Constitutional: No fever/chills Eyes: No visual changes. ENT: No sore throat. Cardiovascular:  Denies chest pain. Respiratory: Denies shortness of breath. Gastrointestinal: Positive epigastric abdominal pain. Positive nausea and vomiting.  No diarrhea.  No constipation. Genitourinary: Negative for dysuria. Musculoskeletal: Negative for back pain. Skin: Negative for rash. Neurological: Negative for focal weakness or numbness. Positive mild HA.   10-point ROS otherwise negative.  ____________________________________________   PHYSICAL EXAM:  VITAL SIGNS: ED Triage Vitals  Enc Vitals Group     BP 06/03/20 0702 137/90     Pulse Rate 06/03/20 0702 (!) 120     Resp 06/03/20 0702 16     Temp 06/03/20 0702 98.6 F (37 C)     Temp Source 06/03/20 0702 Oral     SpO2 06/03/20 0702 100 %     Weight 06/03/20 0704 180 lb (81.6 kg)     Height 06/03/20 0704 5\' 6"  (1.676 m)   Constitutional: Alert and oriented. Well appearing and in no acute distress. Eyes: Conjunctivae are normal.  Head: Atraumatic. Nose: No congestion/rhinnorhea. Mouth/Throat: Mucous membranes are slightly dry.  Neck: No stridor.   Cardiovascular: Tachycardia. Good peripheral circulation. Grossly normal heart sounds.   Respiratory: Normal respiratory effort.  No retractions. Lungs CTAB. Gastrointestinal: Soft with diffuse mild discomfort. No focal tenderness, rebound, or guarding. No distention.  Musculoskeletal: No gross deformities of extremities. Neurologic:  Normal speech and language.  Skin:  Skin is warm,  dry and intact. No rash noted.  ____________________________________________   LABS (all labs ordered are listed, but only abnormal results are displayed)  Labs Reviewed  COMPREHENSIVE METABOLIC PANEL - Abnormal; Notable for the following components:      Result Value   Glucose, Bld 101 (*)    All other components within normal limits  CBC WITH DIFFERENTIAL/PLATELET - Abnormal; Notable for the following components:   MCV 100.2 (*)    Lymphs Abs 0.3 (*)    All other components within normal limits    URINALYSIS, ROUTINE W REFLEX MICROSCOPIC - Abnormal; Notable for the following components:   APPearance HAZY (*)    Ketones, ur 15 (*)    All other components within normal limits  LIPASE, BLOOD  PREGNANCY, URINE   ____________________________________________   PROCEDURES  Procedure(s) performed:   Procedures  None  ____________________________________________   INITIAL IMPRESSION / ASSESSMENT AND PLAN / ED COURSE  Pertinent labs & imaging results that were available during my care of the patient were reviewed by me and considered in my medical decision making (see chart for details).   Patient presents emergency department with nausea vomiting starting yesterday afternoon.  She has diffuse cramping discomfort and no focal tenderness on abdominal exam. No GU or UTI symptoms. Plan for labs and symptom mgmt with reassessment. Differential includes viral GI pathology vs enteritis vs pancreatitis. Doubt COVID clinically. No findings to suspect STD/PID or Pyelonephritis.   09:15 AM  Patient is feeling improved after IV fluids and nausea medication.  Heart rate has normalized.  She is tolerating PO in the ED. discussed mainly fluids at home and advancing diet slowly.  Labs reviewed with no acute abnormality. No abdominal imaging indicated at this time. Discussed ED return precautions.  ____________________________________________  FINAL CLINICAL IMPRESSION(S) / ED DIAGNOSES  Final diagnoses:  Non-intractable vomiting with nausea, unspecified vomiting type     MEDICATIONS GIVEN DURING THIS VISIT:  Medications  lactated ringers bolus 1,000 mL (1,000 mLs Intravenous New Bag/Given 06/03/20 0738)  ondansetron (ZOFRAN) injection 4 mg (4 mg Intravenous Given 06/03/20 0748)     NEW OUTPATIENT MEDICATIONS STARTED DURING THIS VISIT:  New Prescriptions   DICYCLOMINE (BENTYL) 20 MG TABLET    Take 1 tablet (20 mg total) by mouth 3 (three) times daily as needed for spasms.    ONDANSETRON (ZOFRAN ODT) 4 MG DISINTEGRATING TABLET    Take 1 tablet (4 mg total) by mouth every 8 (eight) hours as needed.    Note:  This document was prepared using Dragon voice recognition software and may include unintentional dictation errors.  Nanda Quinton, MD, [facility] Emergency Medicine    Sesilia Poucher, Wonda Olds, MD 06/03/20 [phone]

## 2020-06-04 ENCOUNTER — Emergency Department (HOSPITAL_BASED_OUTPATIENT_CLINIC_OR_DEPARTMENT_OTHER)
Admission: EM | Admit: 2020-06-04 | Discharge: 2020-06-05 | Disposition: A | Discharge status: Home / Self Care | Payer: BC Managed Care – PPO | Attending: Emergency Medicine | Admitting: Emergency Medicine

## 2020-06-04 ENCOUNTER — Encounter (HOSPITAL_BASED_OUTPATIENT_CLINIC_OR_DEPARTMENT_OTHER): Payer: Self-pay | Admitting: Emergency Medicine

## 2020-06-04 ENCOUNTER — Other Ambulatory Visit: Payer: Self-pay

## 2020-06-04 ENCOUNTER — Ambulatory Visit: Payer: Self-pay | Admitting: Allergy and Immunology

## 2020-06-04 DIAGNOSIS — T7840XA Allergy, unspecified, initial encounter: Secondary | ICD-10-CM | POA: Diagnosis not present

## 2020-06-04 DIAGNOSIS — Z7951 Long term (current) use of inhaled steroids: Secondary | ICD-10-CM | POA: Diagnosis not present

## 2020-06-04 MED ORDER — LORATADINE 10 MG PO TABS
10.0000 mg | ORAL_TABLET | Freq: Once | ORAL | Status: AC
  Administered 2020-06-04: 10 mg via ORAL
  Filled 2020-06-04: qty 1

## 2020-06-04 MED ORDER — FAMOTIDINE 20 MG PO TABS
20.0000 mg | ORAL_TABLET | Freq: Once | ORAL | Status: AC
  Administered 2020-06-04: 20 mg via ORAL
  Filled 2020-06-04: qty 1

## 2020-06-04 MED ORDER — DEXAMETHASONE SODIUM PHOSPHATE 10 MG/ML IJ SOLN
10.0000 mg | Freq: Once | INTRAMUSCULAR | Status: AC
  Administered 2020-06-04: 10 mg via INTRAMUSCULAR
  Filled 2020-06-04: qty 1

## 2020-06-04 NOTE — ED Triage Notes (Signed)
Upper and lower lip swelling since starting dicyclomine 20 MG today. No tongue/throat swelling.

## 2020-06-05 ENCOUNTER — Encounter (HOSPITAL_BASED_OUTPATIENT_CLINIC_OR_DEPARTMENT_OTHER): Payer: Self-pay | Admitting: Emergency Medicine

## 2020-06-05 ENCOUNTER — Telehealth: Payer: Self-pay | Admitting: Family Medicine

## 2020-06-05 MED ORDER — PREDNISONE 20 MG PO TABS: ORAL_TABLET | ORAL | 0 refills | Status: DC

## 2020-06-05 MED ORDER — FAMOTIDINE 20 MG PO TABS: 20.0000 mg | ORAL_TABLET | Freq: Two times a day (BID) | ORAL | 0 refills | Status: DC

## 2020-06-05 MED ORDER — PANTOPRAZOLE SODIUM 40 MG PO TBEC
40.0000 mg | DELAYED_RELEASE_TABLET | Freq: Two times a day (BID) | ORAL | 6 refills | Status: DC
Start: 2020-06-05 — End: 2020-06-12

## 2020-06-05 NOTE — Telephone Encounter (Signed)
This is as low as $3 at [facility] and $6.84 at [facility]. Lansoprazole (prevacid) is cheaper too, she will have to [facility]. I can send in Prevacid, but try the Protonix first. Ty.

## 2020-06-05 NOTE — ED Provider Notes (Signed)
[facility] EMERGENCY DEPARTMENT Provider Note   CSN: [id number] Arrival date & time: 06/04/20  2259     History Chief Complaint  Patient presents with  . Allergic Reaction    April Ho is a 44 y.o. female.  The history is provided by the patient.  Allergic Reaction Presenting symptoms: no difficulty breathing, no difficulty swallowing, no itching, no rash and no wheezing   Presenting symptoms comment:  Partial swelling of right upper lip  Severity:  Mild Duration:  12 hours Prior allergic episodes:  Animal allergies and seasonal allergies Context: medications   Relieved by:  Nothing Worsened by:  Nothing Ineffective treatments:  None tried Patient started bentyl for GI symptoms now right upper lip is mildly swollen since this afternoon.  Has not taken anything for symptoms.  No SOB, no hives, no throat pain, No difficulty swallowing.       Past Medical History:  Diagnosis Date  . Genital warts   . GERD (gastroesophageal reflux disease)     Patient Active Problem List   Diagnosis Date Noted  . Genital warts 03/22/2018    Past Surgical History:  Procedure Laterality Date  . CESAREAN SECTION  03/25/1998  . TONSILLECTOMY    . WISDOM TOOTH EXTRACTION       OB History   No obstetric history on file.     Family History  Problem Relation Age of Onset  . Asthma Paternal Grandmother   . Multiple sclerosis Mother   . Diabetes Father   . Kidney failure Father   . Pancreatic disease Father   . Hypertension Father   . Heart attack Father   . Cancer Neg Hx   . Colon cancer Neg Hx   . Esophageal cancer Neg Hx   . Rectal cancer Neg Hx   . Stomach cancer Neg Hx     Social History   Tobacco Use  . Smoking status: Never Smoker  . Smokeless tobacco: Never Used  Vaping Use  . Vaping Use: Never used  Substance Use Topics  . Alcohol use: No  . Drug use: Not Currently    Types: Marijuana    Home Medications Prior to Admission medications     Medication Sig Start Date End Date Taking? Authorizing Provider  albuterol (VENTOLIN HFA) 108 (90 Base) MCG/ACT inhaler Inhale 2 puffs into the lungs every 6 (six) hours as needed for wheezing or shortness of breath. 05/11/20   Wendling, Crosby Oyster, DO  dicyclomine (BENTYL) 20 MG tablet Take 1 tablet (20 mg total) by mouth 3 (three) times daily as needed for spasms. 06/03/20   Long, Wonda Olds, MD  EPINEPHrine 0.3 mg/0.3 mL IJ SOAJ injection Inject 0.3 mLs (0.3 mg total) into the muscle as needed for anaphylaxis. 01/29/19   Shelda Pal, DO  fluconazole (DIFLUCAN) 150 MG tablet Take 1 tab, repeat in 48 hours if no improvement. 05/11/20   Shelda Pal, DO  fluticasone (FLOVENT HFA) 110 MCG/ACT inhaler Inhale 2 puffs into the lungs in the morning and at bedtime. 05/13/20   Shelda Pal, DO  naproxen (NAPROSYN) 500 MG tablet Take 1 tablet (500 mg total) by mouth 2 (two) times daily with a meal. 05/22/20   Wendling, Crosby Oyster, DO  ondansetron (ZOFRAN ODT) 4 MG disintegrating tablet Take 1 tablet (4 mg total) by mouth every 8 (eight) hours as needed. 06/03/20   Long, Wonda Olds, MD  pantoprazole (PROTONIX) 40 MG tablet Take 1 tablet (40 mg total) by mouth  2 (two) times daily before a meal. Patient not taking: Reported on 05/22/2020 05/11/20   Shelda Pal, DO  traZODone (DESYREL) 50 MG tablet Take 0.5-1 tablets (25-50 mg total) by mouth at bedtime as needed for sleep. 02/07/20   Shelda Pal, DO  triamcinolone (KENALOG) 0.025 % cream Apply 1 application topically 2 (two) times daily. This is for use on your face only. 05/11/20   Shelda Pal, DO  triamcinolone cream (KENALOG) 0.1 % Apply 1 application topically 2 (two) times daily. 05/11/20   Shelda Pal, DO    Allergies    Cat hair extract, Dog epithelium, and Dust mite extract  Review of Systems   Review of Systems  Constitutional: Negative for fever.  HENT: Negative for  trouble swallowing.   Eyes: Negative for visual disturbance.  Respiratory: Negative for shortness of breath and wheezing.   Cardiovascular: Negative for chest pain.  Gastrointestinal: Negative for abdominal pain.  Genitourinary: Negative for difficulty urinating.  Musculoskeletal: Negative for arthralgias.  Skin: Negative for itching and rash.  Neurological: Negative for dizziness.  Psychiatric/Behavioral: Negative for agitation.  All other systems reviewed and are negative.   Physical Exam Updated Vital Signs BP 127/81 (BP Location: Right Arm)   Pulse 84   Temp 98.2 F (36.8 C)   Resp 16   Ht 5\' 6"  (1.676 m)   Wt 81.6 kg   SpO2 100%   BMI 29.05 kg/m   Physical Exam Vitals and nursing note reviewed.  Constitutional:      General: She is not in acute distress.    Appearance: Normal appearance.  HENT:     Head: Normocephalic and atraumatic.     Nose: Nose normal.     Mouth/Throat:     Mouth: Mucous membranes are moist.     Pharynx: Oropharynx is clear.     Comments: No swelling of the tongue or the uvula.  No swelling of the floor of the mouth.  Dime sized area of swelling right upper lip  Eyes:     Pupils: Pupils are equal, round, and reactive to light.  Cardiovascular:     Rate and Rhythm: Normal rate and regular rhythm.     Pulses: Normal pulses.     Heart sounds: Normal heart sounds.  Pulmonary:     Effort: Pulmonary effort is normal.     Breath sounds: Normal breath sounds.  Abdominal:     General: Abdomen is flat. Bowel sounds are normal.     Palpations: Abdomen is soft.     Tenderness: There is no abdominal tenderness. There is no guarding.  Musculoskeletal:        General: Normal range of motion.     Cervical back: Normal range of motion and neck supple.  Skin:    General: Skin is warm and dry.     Capillary Refill: Capillary refill takes less than 2 seconds.  Neurological:     General: No focal deficit present.     Mental Status: She is alert and  oriented to person, place, and time.     Deep Tendon Reflexes: Reflexes normal.  Psychiatric:        Mood and Affect: Mood normal.        Behavior: Behavior normal.     ED Results / Procedures / Treatments   Labs (all labs ordered are listed, but only abnormal results are displayed) Labs Reviewed - No data to display  EKG None  Radiology No results found.  Procedures Procedures (including critical care time)  Medications Ordered in ED Medications  dexamethasone (DECADRON) injection 10 mg (10 mg Intramuscular Given 06/04/20 2320)  famotidine (PEPCID) tablet 20 mg (20 mg Oral Given 06/04/20 2319)  loratadine (CLARITIN) tablet 10 mg (10 mg Oral Given 06/04/20 2319)    ED Course  I have reviewed the triage vital signs and the nursing notes.  Pertinent labs & imaging results that were available during my care of the patient were reviewed by me and considered in my medical decision making (see chart for details).    Patient with improvement in the ED.  Stop the bentyl.  Will send prednisone and pepcid to the pharmacy.  Start benadryl every 6 hours.  Strict return precautions given.    Loral Campi was evaluated in Emergency Department on 06/05/2020 for the symptoms described in the history of present illness. She was evaluated in the context of the global COVID-19 pandemic, which necessitated consideration that the patient might be at risk for infection with the SARS-CoV-2 virus that causes COVID-19. Institutional protocols and algorithms that pertain to the evaluation of patients at risk for COVID-19 are in a state of rapid change based on information released by regulatory bodies including the CDC and federal and state organizations. These policies and algorithms were followed during the patient's care in the ED.  Final Clinical Impression(s) / ED Diagnoses Return for intractable cough, coughing up blood,fevers >100.4 unrelieved by medication, shortness of breath, intractable  vomiting, chest pain, shortness of breath, weakness,numbness, changes in speech, facial asymmetry,abdominal pain, passing out,Inability to tolerate liquids or food, cough, altered mental status or any concerns. No signs of systemic illness or infection. The patient is nontoxic-appearing on exam and vital signs are within normal limits.   I have reviewed the triage vital signs and the nursing notes. Pertinent labs &imaging results that were available during my care of the patient were reviewed by me and considered in my medical decision making (see chart for details).After history, exam, and medical workup I feel the patient has beenappropriately medically screened and is safe for discharge home. Pertinent diagnoses were discussed with the patient. Patient was given return precautions.      [place], MD 06/05/20 0127

## 2020-06-05 NOTE — Discharge Instructions (Addendum)
Benadryl 1 tab every 6 hours for 2 days

## 2020-06-05 NOTE — Telephone Encounter (Signed)
She will just get it at [facility]. Sent in there and patient is informed

## 2020-06-05 NOTE — Telephone Encounter (Signed)
Patient states she can't afford pantoprazole. Can prescribe something else?

## 2020-06-12 ENCOUNTER — Encounter: Payer: Self-pay | Admitting: Family Medicine

## 2020-06-12 ENCOUNTER — Ambulatory Visit (INDEPENDENT_AMBULATORY_CARE_PROVIDER_SITE_OTHER): Payer: BC Managed Care – PPO | Admitting: Family Medicine

## 2020-06-12 ENCOUNTER — Other Ambulatory Visit: Payer: Self-pay

## 2020-06-12 VITALS — BP 110/68 | HR 81 | Temp 98.4°F | Ht 66.0 in | Wt 182.5 lb

## 2020-06-12 DIAGNOSIS — A630 Anogenital (venereal) warts: Secondary | ICD-10-CM

## 2020-06-12 DIAGNOSIS — Z09 Encounter for follow-up examination after completed treatment for conditions other than malignant neoplasm: Secondary | ICD-10-CM

## 2020-06-12 DIAGNOSIS — K219 Gastro-esophageal reflux disease without esophagitis: Secondary | ICD-10-CM | POA: Diagnosis not present

## 2020-06-12 MED ORDER — PANTOPRAZOLE SODIUM 40 MG PO TBEC: 40.0000 mg | DELAYED_RELEASE_TABLET | Freq: Two times a day (BID) | ORAL | 6 refills | Status: DC

## 2020-06-12 NOTE — Patient Instructions (Addendum)
Call Center for [facility] at [facility] at [phone] for an appointment.  They are located at [address], [address], [place], [place], [address] (right across the hall from our office).   Use GoodRx for your Protonix. Consider [facility] membership or [facility].   Keep an eye on your ear. Applying pressure can help with keloid formation.   Let us know if you need anything.

## 2020-06-12 NOTE — Progress Notes (Signed)
Chief Complaint  Patient presents with  . Follow-up    ED followup    Subjective: Patient is a 44 y.o. female here for ER follow-up.  The patient was seen in the emergency department for both abdominal issues and swelling.  The latter has resolved and she has a follow-up appointment with the allergy team this week.  Her abdominal issues are slowly improving.  She is taking Protonix for reflux which is quite helpful now that she able to afford it.  The patient has a history of genital warts.  She had cryotherapy performed that resolve the issue 2 years ago.  It has returned in the last several weeks.  No new topicals, pain, or itching.  She would like the cryotherapy again.  Past Medical History:  Diagnosis Date  . Genital warts   . GERD (gastroesophageal reflux disease)     Objective: BP 110/68 (BP Location: Right Arm, Patient Position: Sitting, Cuff Size: Normal)   Pulse 81   Temp 98.4 F (36.9 C) (Oral)   Ht 5\' 6"  (1.676 m)   Wt 182 lb 8 oz (82.8 kg)   SpO2 100%   BMI 29.46 kg/m  General: Awake, appears stated age HEENT: MMM, EOMi Heart: RRR, no murmurs Lungs: CTAB, no rales, wheezes or rhonchi. No accessory muscle use Skin: The patient was examined in the presence of a female chaperone.  There is a pedunculated flesh-colored lesion over the left portion of the mons pubis without any fluctuance, induration, tenderness to palpation, erythema, scaling, or drainage. Psych: Age appropriate judgment and insight, normal affect and mood  Procedure note: cryotherapy Verbal consent obtained 1 skin lesion treated Liquid nitrogen was applied via a thin spray creating an ice ball with 1-2 mm corona surrounding the lesion The patient tolerated the procedure well There were no immediate complications noted  Assessment and Plan: Gastroesophageal reflux disease, unspecified whether esophagitis present - Plan: pantoprazole (PROTONIX) 40 MG tablet  Follow-up for resolved  condition  Genital warts - Plan: PR DESTRUCTION BENIGN LESIONS UP TO 14  1.  Continue Protonix.  I did print a prescription and inform her to use GoodRx for this. 2.  Swelling has improved.  GI issues are also improving. 3.  Cryotherapy performed successfully. I will see her in 1 month for her annual visit that is already scheduled. The patient voiced understanding and agreement to the plan.  [place], DO 06/12/20  4:32 PM

## 2020-06-25 ENCOUNTER — Ambulatory Visit: Payer: Self-pay | Admitting: Allergy and Immunology

## 2020-07-09 ENCOUNTER — Emergency Department (HOSPITAL_BASED_OUTPATIENT_CLINIC_OR_DEPARTMENT_OTHER)
Admission: EM | Admit: 2020-07-09 | Discharge: 2020-07-09 | Disposition: A | Discharge status: Home / Self Care | Payer: BC Managed Care – PPO | Attending: Emergency Medicine | Admitting: Emergency Medicine

## 2020-07-09 ENCOUNTER — Encounter (HOSPITAL_BASED_OUTPATIENT_CLINIC_OR_DEPARTMENT_OTHER): Payer: Self-pay

## 2020-07-09 ENCOUNTER — Other Ambulatory Visit: Payer: Self-pay

## 2020-07-09 DIAGNOSIS — X509XXA Other and unspecified overexertion or strenuous movements or postures, initial encounter: Secondary | ICD-10-CM | POA: Diagnosis not present

## 2020-07-09 DIAGNOSIS — M779 Enthesopathy, unspecified: Secondary | ICD-10-CM | POA: Diagnosis not present

## 2020-07-09 DIAGNOSIS — M778 Other enthesopathies, not elsewhere classified: Secondary | ICD-10-CM

## 2020-07-09 DIAGNOSIS — R2231 Localized swelling, mass and lump, right upper limb: Secondary | ICD-10-CM | POA: Diagnosis present

## 2020-07-09 NOTE — Discharge Instructions (Signed)
1.  Wear splint to rest your third and fourth fingers on the right hand.  Although there is no swelling at this time, you likely are experiencing tendinitis due to repetitive strain. 2.  Schedule a follow-up appointment with your hand specialist as soon as possible.

## 2020-07-09 NOTE — ED Notes (Signed)
ED Provider at bedside. 

## 2020-07-09 NOTE — ED Provider Notes (Signed)
[facility] EMERGENCY DEPARTMENT Provider Note   CSN: [id number] Arrival date & time: 07/09/20  0815     History Chief Complaint  Patient presents with  . Arm Swelling    April Ho is a 44 y.o. female.  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  She reports she frequently has to use a box cutter repetitive manual labor with the right hand.  She is started to get inflammation on the palm and dorsum of the right hand and perception of tightness when she makes a fist.  Sometimes she feels like there is some locking in the third and the fourth fingers.  She reports that she had a trigger finger of the thumb on that same hand that required treatment with injections from a hand surgeon.  He reports the swelling is gone down now but it was more swollen yesterday.  She denies any pain in the neck or shoulder.  No shortness of breath or chest pain.  She reports she has stopped taking anti-inflammatories because she feels like she is taken too many of them.  Nothing is helping the hand except rest.    Past Medical History:  Diagnosis Date  . Genital warts   . GERD (gastroesophageal reflux disease)     Patient Active Problem List   Diagnosis Date Noted  . Genital warts 03/22/2018    Past Surgical History:  Procedure Laterality Date  . CESAREAN SECTION  03/25/1998  . TONSILLECTOMY    . WISDOM TOOTH EXTRACTION       OB History   No obstetric history on file.     Family History  Problem Relation Age of Onset  . Asthma Paternal Grandmother   . Multiple sclerosis Mother   . Diabetes Father   . Kidney failure Father   . Pancreatic disease Father   . Hypertension Father   . Heart attack Father   . Cancer Neg Hx   . Colon cancer Neg Hx   . Esophageal cancer Neg Hx   . Rectal cancer Neg Hx   . Stomach cancer Neg Hx     Social History   Tobacco Use  . Smoking status: Never Smoker  . Smokeless tobacco:  Never Used  Vaping Use  . Vaping Use: Never used  Substance Use Topics  . Alcohol use: No  . Drug use: Not Currently    Types: Marijuana    Home Medications Prior to Admission medications   Medication Sig Start Date End Date Taking? Authorizing Provider  triamcinolone (KENALOG) 0.025 % cream Apply 1 application topically 2 (two) times daily. This is for use on your face only. 05/11/20  Yes Shelda Pal, DO  triamcinolone cream (KENALOG) 0.1 % Apply 1 application topically 2 (two) times daily. 05/11/20  Yes Shelda Pal, DO  albuterol (VENTOLIN HFA) 108 (90 Base) MCG/ACT inhaler Inhale 2 puffs into the lungs every 6 (six) hours as needed for wheezing or shortness of breath. 05/11/20   Shelda Pal, DO  EPINEPHrine 0.3 mg/0.3 mL IJ SOAJ injection Inject 0.3 mLs (0.3 mg total) into the muscle as needed for anaphylaxis. 01/29/19   Shelda Pal, DO  pantoprazole (PROTONIX) 40 MG tablet Take 1 tablet (40 mg total) by mouth 2 (two) times daily before a meal. 06/12/20   Wendling, Crosby Oyster, DO    Allergies    Cat hair extract, Dog epithelium, and Dust mite extract  Review of Systems  Review of Systems Constitutional: No fever chills or malaise Respiratory: No shortness of breath no cough no chest pain Musculoskeletal: No pain in the underarm, no weakness Physical Exam Updated Vital Signs BP 109/80 (BP Location: Left Arm)   Pulse 80   Temp 98.2 F (36.8 C) (Oral)   Resp 16   Ht 5\' 6"  (1.676 m)   Wt 82.1 kg   SpO2 100%   BMI 29.21 kg/m   Physical Exam Constitutional:      Comments: Alert nontoxic otherwise well in appearance.  HENT:     Head: Normocephalic and atraumatic.  Neck:     Comments: No C-spine tenderness.  Patient denies.  Spinal muscular tenderness or trapezius tenderness. Cardiovascular:     Rate and Rhythm: Normal rate and regular rhythm.  Pulmonary:     Effort: Pulmonary effort is normal.     Breath sounds: Normal  breath sounds.  Musculoskeletal:        General: Normal range of motion.     Cervical back: Neck supple.     Comments: Palpation of the axilla on the right has no lymphadenopathy or tenderness.  Normal range of motion of the shoulder and the elbow and wrist without effusion.  No objective swelling of the right hand at this time.  Patient endorses some discomfort with flexion and extension of the third and fourth digits in the palm of the hand.  No triggering or locking at this time.  Patient has normal strength to resist forced flexion and extension at all digits.  Radial pulse 2+.  Skin:    Comments: Plaque psoriasis on the elbows of the right arm.  No erythema or appearance of secondary infection.  Neurological:     General: No focal deficit present.     Mental Status: She is oriented to person, place, and time.     Coordination: Coordination normal.     ED Results / Procedures / Treatments   Labs (all labs ordered are listed, but only abnormal results are displayed) Labs Reviewed - No data to display  EKG None  Radiology No results found.  Procedures Procedures (including critical care time)  Medications Ordered in ED Medications - No data to display  ED Course  I have reviewed the triage vital signs and the nursing notes.  Pertinent labs & imaging results that were available during my care of the patient were reviewed by me and considered in my medical decision making (see chart for details).    MDM Rules/Calculators/A&P                          Patient reports problems with flexion extension of the third and fourth digits on the right.  Previously there was swelling which is now resolved.  There is no erythema present.  No neurologic deficit.  No findings of radiculopathy.  This time suspect overuse tendinitis with repetitive actions requiring a lot of grip and use of the hands.  Patient does have psoriasis but at this time no overt appearing changes of the hand.  Nails  are in good condition.  Possibly pain and swelling are associated with psoriatic arthritis.  Currently will place in static splint for rest.  Patient does not want to take NSAIDs any longer.  Commend follow-up with hand surgeon.  Will give 1 week of rest from repetitive hand activity.  Return precautions reviewed. Final Clinical Impression(s) / ED Diagnoses Final diagnoses:  Tendonitis of right hand  Rx / DC Orders ED Discharge Orders    None       Charlesetta Shanks, MD 07/09/20 1046

## 2020-07-09 NOTE — ED Notes (Signed)
Patient stated that her right hand  feels tight when she makes a fist.  Right hand is dry.

## 2020-07-09 NOTE — ED Triage Notes (Signed)
Pt reports R hand swelling since yesterday. Pt states she when she woke up she noticed it was swollen. Pt also reports burning when she touches it along with pain upon moving. Pt states she had a trigger finger release last year in the same hand.

## 2020-07-13 ENCOUNTER — Encounter: Payer: BC Managed Care – PPO | Admitting: Family Medicine

## 2020-07-17 ENCOUNTER — Encounter: Payer: Self-pay | Admitting: Family Medicine

## 2020-07-17 ENCOUNTER — Other Ambulatory Visit: Payer: Self-pay

## 2020-07-17 ENCOUNTER — Ambulatory Visit (INDEPENDENT_AMBULATORY_CARE_PROVIDER_SITE_OTHER): Payer: BC Managed Care – PPO | Admitting: Family Medicine

## 2020-07-17 VITALS — BP 108/62 | HR 93 | Temp 98.3°F | Ht 66.0 in | Wt 179.4 lb

## 2020-07-17 DIAGNOSIS — Z1159 Encounter for screening for other viral diseases: Secondary | ICD-10-CM

## 2020-07-17 DIAGNOSIS — E786 Lipoprotein deficiency: Secondary | ICD-10-CM

## 2020-07-17 DIAGNOSIS — Z0000 Encounter for general adult medical examination without abnormal findings: Secondary | ICD-10-CM

## 2020-07-17 DIAGNOSIS — D649 Anemia, unspecified: Secondary | ICD-10-CM

## 2020-07-17 LAB — COMPREHENSIVE METABOLIC PANEL
ALT: 15 U/L (ref 0–35)
AST: 14 U/L (ref 0–37)
Albumin: 4.5 g/dL (ref 3.5–5.2)
Alkaline Phosphatase: 116 U/L (ref 39–117)
BUN: 15 mg/dL (ref 6–23)
CO2: 32 mEq/L (ref 19–32)
Calcium: 9.7 mg/dL (ref 8.4–10.5)
Chloride: 104 mEq/L (ref 96–112)
Creatinine, Ser: 0.87 mg/dL (ref 0.40–1.20)
GFR: 81.89 mL/min (ref >60.00)
Glucose, Bld: 71 mg/dL (ref 70–99)
Potassium: 4.3 mEq/L (ref 3.5–5.1)
Sodium: 141 mEq/L (ref 135–145)
Total Bilirubin: 0.6 mg/dL (ref 0.2–1.2)
Total Protein: 7 g/dL (ref 6.0–8.3)

## 2020-07-17 LAB — LIPID PANEL
Cholesterol: 154 mg/dL (ref 0–200)
HDL: 46.2 mg/dL (ref >39.00)
LDL Cholesterol: 97 mg/dL (ref 0–99)
NonHDL: 107.63
Total CHOL/HDL Ratio: 3
Triglycerides: 51 mg/dL (ref 0.0–149.0)
VLDL: 10.2 mg/dL (ref 0.0–40.0)

## 2020-07-17 LAB — CBC
HCT: 41 % (ref 36.0–46.0)
Hemoglobin: 13.6 g/dL (ref 12.0–15.0)
MCHC: 33.1 g/dL (ref 30.0–36.0)
MCV: 99.7 fl (ref 78.0–100.0)
Platelets: 309 10*3/uL (ref 150.0–400.0)
RBC: 4.12 Mil/uL (ref 3.87–5.11)
RDW: 13.9 % (ref 11.5–15.5)
WBC: 2.8 10*3/uL — ABNORMAL LOW (ref 4.0–10.5)

## 2020-07-17 MED ORDER — NAPROXEN 500 MG PO TABS
500.0000 mg | ORAL_TABLET | Freq: Two times a day (BID) | ORAL | 2 refills | Status: DC
Start: 2020-07-17 — End: 2021-07-23

## 2020-07-17 NOTE — Progress Notes (Signed)
Chief Complaint  Patient presents with  . Annual Exam     Well Woman April Ho is here for a complete physical.   Her last physical was >1 year ago.  Current diet: in general, diet is OK. Current exercise: none. Weight is stable and she denies fatigue out of ordinary. Seatbelt? Yes  Health Maintenance Pap/HPV- Due Tetanus- Yes Hep C screening- No HIV screening- Yes  Past Medical History:  Diagnosis Date  . Genital warts   . GERD (gastroesophageal reflux disease)      Past Surgical History:  Procedure Laterality Date  . CESAREAN SECTION  03/25/1998  . TONSILLECTOMY    . WISDOM TOOTH EXTRACTION      Medications  Current Outpatient Medications on File Prior to Visit  Medication Sig Dispense Refill  . albuterol (VENTOLIN HFA) 108 (90 Base) MCG/ACT inhaler Inhale 2 puffs into the lungs every 6 (six) hours as needed for wheezing or shortness of breath. 8 g 0  . EPINEPHrine 0.3 mg/0.3 mL IJ SOAJ injection Inject 0.3 mLs (0.3 mg total) into the muscle as needed for anaphylaxis. 1 each 1  . pantoprazole (PROTONIX) 40 MG tablet Take 1 tablet (40 mg total) by mouth 2 (two) times daily before a meal. 60 tablet 6  . triamcinolone (KENALOG) 0.025 % cream Apply 1 application topically 2 (two) times daily. This is for use on your face only. 30 g 0  . triamcinolone cream (KENALOG) 0.1 % Apply 1 application topically 2 (two) times daily. 30 g 0    Allergies Allergies  Allergen Reactions  . Cat Hair Extract Hives    Cat hair  . Dog Epithelium Hives  . Dust Mite Extract     Lips swell    Review of Systems: Constitutional:  no unexpected weight changes Eye:  no recent significant change in vision Ear/Nose/Mouth/Throat:  Ears:  no recent change in hearing Nose/Mouth/Throat:  no complaints of nasal congestion, no sore throat Cardiovascular: no chest pain Respiratory:  no shortness of breath Gastrointestinal:  no abdominal pain, no change in bowel habits GU:  Female: negative  for dysuria or pelvic pain Musculoskeletal/Extremities: +L shoulder pain Integumentary (Skin/Breast):  no new abnormal skin lesions reported Neurologic:  no headaches Endocrine:  denies fatigue Hematologic/Lymphatic:  No areas of easy bleeding  Exam BP 108/62 (BP Location: Left Arm, Patient Position: Sitting, Cuff Size: Normal)   Pulse 93   Temp 98.3 F (36.8 C) (Oral)   Ht 5\' 6"  (1.676 m)   Wt 179 lb 6 oz (81.4 kg)   SpO2 97%   BMI 28.95 kg/m  General:  well developed, well nourished, in no apparent distress Skin:  no significant moles, warts, or growths Head:  no masses, lesions, or tenderness Eyes:  pupils equal and round, sclera anicteric without injection Ears:  canals without lesions, TMs shiny without retraction, no obvious effusion, no erythema Nose:  nares patent, septum midline, mucosa normal, and no drainage or sinus tenderness Throat/Pharynx:  lips and gingiva without lesion; tongue and uvula midline; non-inflamed pharynx; no exudates or postnasal drainage Neck: neck supple without adenopathy, thyromegaly, or masses Lungs:  clear to auscultation, breath sounds equal bilaterally, no respiratory distress Cardio:  regular rate and rhythm, no LE edema Abdomen:  abdomen soft, nontender; bowel sounds normal; no masses or organomegaly Genital: Defer to GYN Musculoskeletal: +ttp over the left trapezius; negative left shoulder exam otherwise; otherwise symmetrical muscle groups noted without atrophy or deformity Extremities:  no clubbing, cyanosis, or edema, no deformities,  no skin discoloration Neuro:  gait normal; deep tendon reflexes normal and symmetric Psych: well oriented with normal range of affect and appropriate judgment/insight  Assessment and Plan  Well adult exam  Encounter for hepatitis C screening test for low risk patient - Plan: Hepatitis C antibody  Anemia, unspecified type - Plan: CBC  Low HDL (under 40) - Plan: Comprehensive metabolic panel, Lipid panel    Well 44 y.o. female. Counseled on diet and exercise. Other orders as above. I think she strained her left trapezius, I gave her some stretches and exercises.  Physical therapy versus trigger point injection if no improvement. Follow up in 6 mo or prn. The patient voiced understanding and agreement to the plan.  [place], DO 07/17/20 11:55 AM

## 2020-07-17 NOTE — Patient Instructions (Addendum)
Call Center for [place] at [facility] at [phone] for an appointment.  They are located at [address], [place], [place], [address] (right across the hall from our office).  Give [place] 2-3 business days to get the results of your labs back.   Keep the diet clean and stay active.  OK to use Debrox (peroxide) in the ear to loosen up wax. Also recommend using a bulb syringe (for removing boogers from baby's noses) to flush through warm water. Do not use Q-tips as this can impact wax further.  Heat (pad or rice pillow in microwave) over affected area, 10-15 minutes twice daily.   Let us know if you need anything.  Trapezius stretches/exercises Do exercises exactly as told by your health care provider and adjust them as directed. It is normal to feel mild stretching, pulling, tightness, or discomfort as you do these exercises, but you should stop right away if you feel sudden pain or your pain gets worse.  Stretching and range of motion exercises These exercises warm up your muscles and joints and improve the movement and flexibility of your shoulder. These exercises can also help to relieve pain, numbness, and tingling. If you are unable to do any of the following for any reason, do not further attempt to do it.   Exercise A: Flexion, standing    1. Stand and hold a broomstick, a cane, or a similar object. Place your hands a little more than shoulder-width apart on the object. Your left / right hand should be palm-up, and your other hand should be palm-down. 2. Push the stick to raise your left / right arm out to your side and then over your head. Use your other hand to help move the stick. Stop when you feel a stretch in your shoulder, or when you reach the angle that is recommended by your health care provider. ? Avoid shrugging your shoulder while you raise your arm. Keep your shoulder blade tucked down toward your spine. 3. Hold for 30 seconds. 4. Slowly  return to the starting position. Repeat 2 times. Complete this exercise 3 times per week.  Exercise B: Abduction, supine    1. Lie on your back and hold a broomstick, a cane, or a similar object. Place your hands a little more than shoulder-width apart on the object. Your left / right hand should be palm-up, and your other hand should be palm-down. 2. Push the stick to raise your left / right arm out to your side and then over your head. Use your other hand to help move the stick. Stop when you feel a stretch in your shoulder, or when you reach the angle that is recommended by your health care provider. ? Avoid shrugging your shoulder while you raise your arm. Keep your shoulder blade tucked down toward your spine. 3. Hold for 30 seconds. 4. Slowly return to the starting position. Repeat 2 times. Complete this exercise 3 times per week.  Exercise C: Flexion, active-assisted    1. Lie on your back. You may bend your knees for comfort. 2. Hold a broomstick, a cane, or a similar object. Place your hands about shoulder-width apart on the object. Your palms should face toward your feet. 3. Raise the stick and move your arms over your head and behind your head, toward the floor. Use your healthy arm to help your left / right arm move farther. Stop when you feel a gentle stretch in your shoulder,  or when you reach the angle where your health care provider tells you to stop. 4. Hold for 30 seconds. 5. Slowly return to the starting position. Repeat 2 times. Complete this exercise 3 times per week.  Exercise D: External rotation and abduction    1. Stand in a door frame with one of your feet slightly in front of the other. This is called a staggered stance. 2. Choose one of the following positions as told by your health care provider: ? Place your hands and forearms on the door frame above your head. ? Place your hands and forearms on the door frame at the height of your head. ? Place your  hands on the door frame at the height of your elbows. 3. Slowly move your weight onto your front foot until you feel a stretch across your chest and in the front of your shoulders. Keep your head and chest upright and keep your abdominal muscles tight. 4. Hold for 30 seconds. 5. To release the stretch, shift your weight to your back foot. Repeat 2 times. Complete this stretch 3 times per week.  Strengthening exercises These exercises build strength and endurance in your shoulder. Endurance is the ability to use your muscles for a long time, even after your muscles get tired. Exercise E: Scapular depression and adduction  1. Sit on a stable chair. Support your arms in front of you with pillows, armrests, or a tabletop. Keep your elbows in line with the sides of your body. 2. Gently move your shoulder blades down toward your middle back. Relax the muscles on the tops of your shoulders and in the back of your neck. 3. Hold for 3 seconds. 4. Slowly release the tension and relax your muscles completely before doing this exercise again. Repeat for a total of 10 repetitions. 5. After you have practiced this exercise, try doing the exercise without the arm support. Then, try the exercise while standing instead of sitting. Repeat 2 times. Complete this exercise 3 times per week.  Exercise F: Shoulder abduction, isometric    1. Stand or sit about 4-6 inches (10-15 cm) from a wall with your left / right side facing the wall. 2. Bend your left / right elbow and gently press your elbow against the wall. 3. Increase the pressure slowly until you are pressing as hard as you can without shrugging your shoulder. 4. Hold for 3 seconds. 5. Slowly release the tension and relax your muscles completely. Repeat for a total of 10 repetitions. Repeat 2 times. Complete this exercise 3 times per week.  Exercise G: Shoulder flexion, isometric    1. Stand or sit about 4-6 inches (10-15 cm) away from a wall with  your left / right side facing the wall. 2. Keep your left / right elbow straight and gently press the top of your fist against the wall. Increase the pressure slowly until you are pressing as hard as you can without shrugging your shoulder. 3. Hold for 10-15 seconds. 4. Slowly release the tension and relax your muscles completely. Repeat for a total of 10 repetitions. Repeat 2 times. Complete this exercise 3 times per week.  Exercise H: Internal rotation    1. Sit in a stable chair without armrests, or stand. Secure an exercise band at your left / right side, at elbow height. 2. Place a soft object, such as a folded towel or a small pillow, under your left / right upper arm so your elbow is a few inches (  about 8 cm) away from your side. 3. Hold the end of the exercise band so the band stretches. 4. Keeping your elbow pressed against the soft object under your arm, move your forearm across your body toward your abdomen. Keep your body steady so the movement is only coming from your shoulder. 5. Hold for 3 seconds. 6. Slowly return to the starting position. Repeat for a total of 10 repetitions. Repeat 2 times. Complete this exercise 3 times per week.  Exercise I: External rotation    1. Sit in a stable chair without armrests, or stand. 2. Secure an exercise band at your left / right side, at elbow height. 3. Place a soft object, such as a folded towel or a small pillow, under your left / right upper arm so your elbow is a few inches (about 8 cm) away from your side. 4. Hold the end of the exercise band so the band stretches. 5. Keeping your elbow pressed against the soft object under your arm, move your forearm out, away from your abdomen. Keep your body steady so the movement is only coming from your shoulder. 6. Hold for 3 seconds. 7. Slowly return to the starting position. Repeat for a total of 10 repetitions. Repeat 2 times. Complete this exercise 3 times per week. Exercise J:  Shoulder extension  1. Sit in a stable chair without armrests, or stand. Secure an exercise band to a stable object in front of you so the band is at shoulder height. 2. Hold one end of the exercise band in each hand. Your palms should face each other. 3. Straighten your elbows and lift your hands up to shoulder height. 4. Step back, away from the secured end of the exercise band, until the band stretches. 5. Squeeze your shoulder blades together and pull your hands down to the sides of your thighs. Stop when your hands are straight down by your sides. Do not let your hands go behind your body. 6. Hold for 3 seconds. 7. Slowly return to the starting position. Repeat for a total of 10 repetitions. Repeat 2 times. Complete this exercise 3 times per week.  Exercise K: Shoulder extension, prone    1. Lie on your abdomen on a firm surface so your left / right arm hangs over the edge. 2. Hold a 5 lb weight in your hand so your palm faces in toward your body. Your arm should be straight. 3. Squeeze your shoulder blade down toward the middle of your back. 4. Slowly raise your arm behind you, up to the height of the surface that you are lying on. Keep your arm straight. 5. Hold for 3 seconds. 6. Slowly return to the starting position and relax your muscles. Repeat for a total of 10 repetitions. Repeat 2 times. Complete this exercise 3 times per week.   Exercise L: Horizontal abduction, prone  1. Lie on your abdomen on a firm surface so your left / right arm hangs over the edge. 2. Hold a 5 lb weight in your hand so your palm faces toward your feet. Your arm should be straight. 3. Squeeze your shoulder blade down toward the middle of your back. 4. Bend your elbow so your hand moves up, until your elbow is bent to an "L" shape (90 degrees). With your elbow bent, slowly move your forearm forward and up. Raise your hand up to the height of the surface that you are lying on. ? Your upper arm should not  move,  and your elbow should stay bent. ? At the top of the movement, your palm should face the floor. 5. Hold for 3 seconds. 6. Slowly return to the starting position and relax your muscles. Repeat for a total of 10 repetitions. Repeat 2 times. Complete this exercise 3 times per week.  Exercise M: Horizontal abduction, standing  1. Sit on a stable chair, or stand. 2. Secure an exercise band to a stable object in front of you so the band is at shoulder height. 3. Hold one end of the exercise band in each hand. 4. Straighten your elbows and lift your hands straight in front of you, up to shoulder height. Your palms should face down, toward the floor. 5. Step back, away from the secured end of the exercise band, until the band stretches. 6. Move your arms out to your sides, and keep your arms straight. 7. Hold for 3 seconds. 8. Slowly return to the starting position. Repeat for a total of 10 repetitions. Repeat 2 times. Complete this exercise 3 times per week.  Exercise N: Scapular retraction and elevation  1. Sit on a stable chair, or stand. 2. Secure an exercise band to a stable object in front of you so the band is at shoulder height. 3. Hold one end of the exercise band in each hand. Your palms should face each other. 4. Sit in a stable chair without armrests, or stand. 5. Step back, away from the secured end of the exercise band, until the band stretches. 6. Squeeze your shoulder blades together and lift your hands over your head. Keep your elbows straight. 7. Hold for 3 seconds. 8. Slowly return to the starting position. Repeat for a total of 10 repetitions. Repeat 2 times. Complete this exercise 3 times per week.  This information is not intended to replace advice given to you by your health care provider. Make sure you discuss any questions you have with your health care provider. Document Released: 07/25/2005 Document Revised: 03/31/2016 Document Reviewed: 06/11/2015 Elsevier  Interactive Patient Education  2017 [employer].

## 2020-07-20 LAB — HEPATITIS C ANTIBODY
Hepatitis C Ab: NONREACTIVE
SIGNAL TO CUT-OFF: 0.01 (ref <1.00)

## 2020-07-27 ENCOUNTER — Encounter (HOSPITAL_BASED_OUTPATIENT_CLINIC_OR_DEPARTMENT_OTHER): Payer: Self-pay | Admitting: Emergency Medicine

## 2020-07-27 ENCOUNTER — Other Ambulatory Visit: Payer: Self-pay

## 2020-07-27 ENCOUNTER — Emergency Department (HOSPITAL_BASED_OUTPATIENT_CLINIC_OR_DEPARTMENT_OTHER)
Admission: EM | Admit: 2020-07-27 | Discharge: 2020-07-27 | Disposition: A | Discharge status: Home / Self Care | Payer: BC Managed Care – PPO | Attending: Emergency Medicine | Admitting: Emergency Medicine

## 2020-07-27 DIAGNOSIS — M79644 Pain in right finger(s): Secondary | ICD-10-CM | POA: Insufficient documentation

## 2020-07-27 DIAGNOSIS — Z79899 Other long term (current) drug therapy: Secondary | ICD-10-CM | POA: Insufficient documentation

## 2020-07-27 MED ORDER — IBUPROFEN 800 MG PO TABS
800.0000 mg | ORAL_TABLET | Freq: Once | ORAL | Status: AC
  Administered 2020-07-27: 800 mg via ORAL
  Filled 2020-07-27: qty 1

## 2020-07-27 NOTE — ED Triage Notes (Signed)
Seen previously for right trigger finger release to the thumb.  Reports it has started hurting again for the last week.

## 2020-07-27 NOTE — Discharge Instructions (Addendum)
You are seen today for ongoing thumb pain.  Take ibuprofen as needed for pain.  Follow-up with Dr. Percell Miller.

## 2020-07-27 NOTE — ED Provider Notes (Signed)
[facility] EMERGENCY DEPARTMENT Provider Note   CSN: [id number] Arrival date & time: 07/27/20  5456     History Chief Complaint  Patient presents with   Hand Pain    April Ho is a 44 y.o. female.  HPI     This is a 44 year old female who presents with right thumb pain.  Patient reports that she has had recurrence of right thumb pain.  She states it is particularly painful with flexion of the thumb over the thenar eminence.  She rates her pain 8 out of 10.  She left work tonight because of pain.  She reports difficulty with fine motor task secondary to pain.  No numbness or tingling.  She had similar pain in July and saw an orthopedist.  At that time she had a steroid injection which seemed to help.  No recent injury.  She is right hand dominant.  Past Medical History:  Diagnosis Date   Genital warts    GERD (gastroesophageal reflux disease)     Patient Active Problem List   Diagnosis Date Noted   Genital warts 03/22/2018    Past Surgical History:  Procedure Laterality Date   CESAREAN SECTION  03/25/1998   TONSILLECTOMY     WISDOM TOOTH EXTRACTION       OB History   No obstetric history on file.     Family History  Problem Relation Age of Onset   Asthma Paternal Grandmother    Multiple sclerosis Mother    Diabetes Father    Kidney failure Father    Pancreatic disease Father    Hypertension Father    Heart attack Father    Cancer Neg Hx    Colon cancer Neg Hx    Esophageal cancer Neg Hx    Rectal cancer Neg Hx    Stomach cancer Neg Hx     Social History   Tobacco Use   Smoking status: Never Smoker   Smokeless tobacco: Never Used  Vaping Use   Vaping Use: Never used  Substance Use Topics   Alcohol use: No   Drug use: Not Currently    Types: Marijuana    Home Medications Prior to Admission medications   Medication Sig Start Date End Date Taking? Authorizing Provider  albuterol (VENTOLIN HFA) 108 (90 Base)  MCG/ACT inhaler Inhale 2 puffs into the lungs every 6 (six) hours as needed for wheezing or shortness of breath. 05/11/20   Shelda Pal, DO  EPINEPHrine 0.3 mg/0.3 mL IJ SOAJ injection Inject 0.3 mLs (0.3 mg total) into the muscle as needed for anaphylaxis. 01/29/19   Shelda Pal, DO  naproxen (NAPROSYN) 500 MG tablet Take 1 tablet (500 mg total) by mouth 2 (two) times daily with a meal. 07/17/20   Wendling, Crosby Oyster, DO  pantoprazole (PROTONIX) 40 MG tablet Take 1 tablet (40 mg total) by mouth 2 (two) times daily before a meal. 06/12/20   Wendling, Crosby Oyster, DO  triamcinolone (KENALOG) 0.025 % cream Apply 1 application topically 2 (two) times daily. This is for use on your face only. 05/11/20   Shelda Pal, DO  triamcinolone cream (KENALOG) 0.1 % Apply 1 application topically 2 (two) times daily. 05/11/20   Shelda Pal, DO    Allergies    Cat hair extract, Dog epithelium, and Dust mite extract  Review of Systems   Review of Systems  Musculoskeletal:       Thumb pain  Neurological: Negative for weakness and numbness.  All other systems reviewed and are negative.   Physical Exam Updated Vital Signs BP 130/88 (BP Location: Right Arm)    Pulse 85    Temp 97.8 F (36.6 C) (Oral)    Resp 18    Ht 1.676 m (5\' 6" )    Wt 81.2 kg    SpO2 100%    BMI 28.89 kg/m   Physical Exam Vitals and nursing note reviewed.  Constitutional:      Appearance: She is well-developed and well-nourished.  HENT:     Head: Normocephalic and atraumatic.     Mouth/Throat:     Mouth: Mucous membranes are moist.  Eyes:     Pupils: Pupils are equal, round, and reactive to light.  Cardiovascular:     Rate and Rhythm: Normal rate and regular rhythm.  Pulmonary:     Effort: Pulmonary effort is normal. No respiratory distress.  Musculoskeletal:     Cervical back: Neck supple.     Comments: Focused examination of the right hand with tenderness palpation over the  thenar eminence, no significant swelling, normal range of motion of the wrist and all 5 digits, 2+ radial pulse  Skin:    General: Skin is warm and dry.  Neurological:     Mental Status: She is alert and oriented to person, place, and time.  Psychiatric:        Mood and Affect: Mood and affect and mood normal.     ED Results / Procedures / Treatments   Labs (all labs ordered are listed, but only abnormal results are displayed) Labs Reviewed - No data to display  EKG None  Radiology No results found.  Procedures Procedures (including critical care time)  Medications Ordered in ED Medications  ibuprofen (ADVIL) tablet 800 mg (has no administration in time range)    ED Course  I have reviewed the triage vital signs and the nursing notes.  Pertinent labs & imaging results that were available during my care of the patient were reviewed by me and considered in my medical decision making (see chart for details).    MDM Rules/Calculators/A&P                          Patient presents with chronic right thumb pain.  Worse with repetitive fine motor tasks.  She is overall nontoxic vital signs are reassuring.  She has tenderness over the thenar eminence but good range of motion and is neurovascularly intact.  She apparently received a steroid injection by Dr. Percell Miller with some improvement.  Question tendinitis.  No indication of infection.  No indication for imaging as I doubt fracture.  Recommend anti-inflammatories and follow-up again with Dr. Percell Miller  After history, exam, and medical workup I feel the patient has been appropriately medically screened and is safe for discharge home. Pertinent diagnoses were discussed with the patient. Patient was given return precautions.  Final Clinical Impression(s) / ED Diagnoses Final diagnoses:  Pain of right thumb    Rx / DC Orders ED Discharge Orders    None       Fatimata Talsma, Barbette Hair, MD 07/27/20 [phone]

## 2020-11-17 ENCOUNTER — Other Ambulatory Visit: Payer: Self-pay

## 2020-11-17 ENCOUNTER — Ambulatory Visit: Payer: BC Managed Care – PPO | Admitting: Family Medicine

## 2020-11-17 ENCOUNTER — Encounter: Payer: Self-pay | Admitting: Family Medicine

## 2020-11-17 VITALS — BP 122/64 | HR 78 | Temp 98.3°F | Ht 66.0 in | Wt 186.0 lb

## 2020-11-17 DIAGNOSIS — L309 Dermatitis, unspecified: Secondary | ICD-10-CM | POA: Diagnosis not present

## 2020-11-17 MED ORDER — METHYLPREDNISOLONE ACETATE 80 MG/ML IJ SUSP
80.0000 mg | Freq: Once | INTRAMUSCULAR | Status: AC
  Administered 2020-11-17: 80 mg via INTRAMUSCULAR

## 2020-11-17 MED ORDER — TRIAMCINOLONE ACETONIDE 0.1 % EX CREA: 1.0000 "application " | TOPICAL_CREAM | Freq: Two times a day (BID) | CUTANEOUS | 0 refills | Status: DC

## 2020-11-17 NOTE — Patient Instructions (Signed)
Try not to itch.   Avoid scented products.   Have someone examine your home regarding possible infestation if you feel strongly you are getting bitten.  Let us know if you need anything.

## 2020-11-17 NOTE — Progress Notes (Signed)
Chief Complaint  Patient presents with  . Rash    Rash on her back    April Ho is a 45 y.o. female here for a skin complaint.  Duration: 2 months Location: back Pruritic? Yes Painful? Yes Drainage? No New soaps/lotions/topicals/detergents? No  No close contacts w similar s/s's.  Other associated symptoms: will bleed Therapies tried thus far: Vaseline and lotion  Past Medical History:  Diagnosis Date  . Genital warts   . GERD (gastroesophageal reflux disease)     BP 122/64 (BP Location: Left Arm, Patient Position: Sitting, Cuff Size: Normal)   Pulse 78   Temp 98.3 F (36.8 C) (Oral)   Ht 5\' 6"  (1.676 m)   Wt 186 lb (84.4 kg)   SpO2 98%   BMI 30.02 kg/m  Gen: awake, alert, appearing stated age Lungs: No accessory muscle use Skin: low back: discoid and scaly patches/macules spread across back. No drainage, erythema, TTP, fluctuance, excoriation Psych: Age appropriate judgment and insight  Dermatitis - Plan: triamcinolone cream (KENALOG) 0.1 %  Depo shot today. Above cream bid. Avoid scented products. Try not to itch. Ice/cold can be helpful.  F/u prn. The patient voiced understanding and agreement to the plan.  [place], DO 11/17/20 2:53 PM

## 2021-01-12 ENCOUNTER — Other Ambulatory Visit: Payer: Self-pay | Admitting: Family Medicine

## 2021-01-12 ENCOUNTER — Telehealth: Payer: Self-pay | Admitting: Family Medicine

## 2021-01-12 DIAGNOSIS — L309 Dermatitis, unspecified: Secondary | ICD-10-CM

## 2021-01-12 DIAGNOSIS — R21 Rash and other nonspecific skin eruption: Secondary | ICD-10-CM

## 2021-01-12 MED ORDER — TRIAMCINOLONE ACETONIDE 0.1 % EX CREA: 1.0000 "application " | TOPICAL_CREAM | Freq: Two times a day (BID) | CUTANEOUS | 0 refills | Status: DC

## 2021-01-12 NOTE — Telephone Encounter (Signed)
Refill done.  

## 2021-01-12 NOTE — Telephone Encounter (Signed)
Medication:triamcinolone cream (KENALOG) 0.1 % [[id number]   Has the patient contacted their pharmacy? Yes.   (If no, request that the patient contact the pharmacy for the refill.) (If yes, when and what did the pharmacy advise?)  Preferred Pharmacy (with phone number or street name):   [facility] [place], [address]  [place], [place] [address]  Phone:  [phone] Fax:  [phone]   Agent: Please be advised that RX refills may take up to 3 business days. We ask that you follow-up with your pharmacy.

## 2021-01-28 ENCOUNTER — Other Ambulatory Visit: Payer: Self-pay | Admitting: Family Medicine

## 2021-01-28 DIAGNOSIS — B373 Candidiasis of vulva and vagina: Secondary | ICD-10-CM

## 2021-01-28 DIAGNOSIS — B3731 Acute candidiasis of vulva and vagina: Secondary | ICD-10-CM

## 2021-01-29 ENCOUNTER — Telehealth: Payer: Self-pay | Admitting: Family Medicine

## 2021-01-29 ENCOUNTER — Telehealth (INDEPENDENT_AMBULATORY_CARE_PROVIDER_SITE_OTHER): Payer: BC Managed Care – PPO | Admitting: Family Medicine

## 2021-01-29 ENCOUNTER — Other Ambulatory Visit: Payer: Self-pay

## 2021-01-29 ENCOUNTER — Encounter: Payer: Self-pay | Admitting: Family Medicine

## 2021-01-29 DIAGNOSIS — B3731 Acute candidiasis of vulva and vagina: Secondary | ICD-10-CM

## 2021-01-29 DIAGNOSIS — B373 Candidiasis of vulva and vagina: Secondary | ICD-10-CM

## 2021-01-29 DIAGNOSIS — K6289 Other specified diseases of anus and rectum: Secondary | ICD-10-CM | POA: Diagnosis not present

## 2021-01-29 MED ORDER — FLUCONAZOLE 150 MG PO TABS: ORAL_TABLET | ORAL | 0 refills | Status: DC

## 2021-01-29 MED ORDER — HYDROCORTISONE 2.5 % EX CREA: TOPICAL_CREAM | Freq: Two times a day (BID) | CUTANEOUS | 0 refills | Status: DC

## 2021-01-29 NOTE — Progress Notes (Signed)
Chief Complaint  Patient presents with   Follow-up    Needs a refill for a yeast infection.    April Ho is a 45 y.o. female here for vaginal discharge. Due to COVID-19 pandemic, we are interacting via web portal for an electronic face-to-face visit. I verified patient's ID using 2 identifiers. Patient agreed to proceed with visit via this method. Patient is at home, I am at office. Patient and I are present for visit.   Duration: 3 days Description of discharge: white Odor: No New sexual partner: No Urinary complaints: No IUD? No Itching? Yes Denies fevers, bleeding, pregnancy abdominal pain.  Irritation around bottom for several weeks. Worse w BM. No bleeding. Has not tried anything.   Past Medical History:  Diagnosis Date   Genital warts    GERD (gastroesophageal reflux disease)    Family History  Problem Relation Age of Onset   Asthma Paternal Grandmother    Multiple sclerosis Mother    Diabetes Father    Kidney failure Father    Pancreatic disease Father    Hypertension Father    Heart attack Father    Cancer Neg Hx    Colon cancer Neg Hx    Esophageal cancer Neg Hx    Rectal cancer Neg Hx    Stomach cancer Neg Hx    Objective No conversational dyspnea Age appropriate judgment and insight Nml affect and mood  Vaginal yeast infection - Plan: fluconazole (DIFLUCAN) 150 MG tablet  Anal irritation - Plan: hydrocortisone 2.5 % cream  Diflucan. 7-10 d of bid HC cream. Inperson appt if no better.  F/u prn otherwise.  Pt voiced understanding and agreement to the plan.  [place], DO 01/29/21 3:34 PM

## 2021-03-12 ENCOUNTER — Other Ambulatory Visit: Payer: Self-pay

## 2021-03-12 DIAGNOSIS — K6289 Other specified diseases of anus and rectum: Secondary | ICD-10-CM

## 2021-03-12 MED ORDER — HYDROCORTISONE 2.5 % EX CREA: TOPICAL_CREAM | Freq: Two times a day (BID) | CUTANEOUS | 0 refills | Status: DC

## 2021-03-16 ENCOUNTER — Other Ambulatory Visit: Payer: Self-pay | Admitting: Family Medicine

## 2021-03-16 DIAGNOSIS — R21 Rash and other nonspecific skin eruption: Secondary | ICD-10-CM

## 2021-03-16 NOTE — Telephone Encounter (Signed)
Cream has been Discontinued. Ok to refill

## 2021-05-31 ENCOUNTER — Telehealth: Payer: Self-pay | Admitting: Family Medicine

## 2021-05-31 DIAGNOSIS — R21 Rash and other nonspecific skin eruption: Secondary | ICD-10-CM

## 2021-05-31 MED ORDER — TRIAMCINOLONE ACETONIDE 0.025 % EX CREA: TOPICAL_CREAM | CUTANEOUS | 0 refills | Status: DC

## 2021-05-31 NOTE — Telephone Encounter (Signed)
Medication: triamcinolone (KENALOG) 0.025 % cream   Has the patient contacted their pharmacy? No. (If no, request that the patient contact the pharmacy for the refill.) (If yes, when and what did the pharmacy advise?)  Preferred Pharmacy (with phone number or street name):   [facility] [place], [address]  [place], [place] [address]  Phone:  [phone]  Fax:  [phone]    Agent: Please be advised that RX refills may take up to 3 business days. We ask that you follow-up with your pharmacy.

## 2021-06-02 ENCOUNTER — Other Ambulatory Visit: Payer: Self-pay

## 2021-06-02 ENCOUNTER — Telehealth (INDEPENDENT_AMBULATORY_CARE_PROVIDER_SITE_OTHER): Payer: BC Managed Care – PPO | Admitting: Family Medicine

## 2021-06-02 ENCOUNTER — Encounter: Payer: Self-pay | Admitting: Family Medicine

## 2021-06-02 DIAGNOSIS — K6289 Other specified diseases of anus and rectum: Secondary | ICD-10-CM

## 2021-06-02 DIAGNOSIS — Z1283 Encounter for screening for malignant neoplasm of skin: Secondary | ICD-10-CM | POA: Diagnosis not present

## 2021-06-02 DIAGNOSIS — R21 Rash and other nonspecific skin eruption: Secondary | ICD-10-CM

## 2021-06-02 DIAGNOSIS — J069 Acute upper respiratory infection, unspecified: Secondary | ICD-10-CM | POA: Diagnosis not present

## 2021-06-02 MED ORDER — TRIAMCINOLONE ACETONIDE 0.025 % EX CREA: TOPICAL_CREAM | CUTANEOUS | 0 refills | Status: DC

## 2021-06-02 MED ORDER — PREDNISONE 20 MG PO TABS: 40.0000 mg | ORAL_TABLET | Freq: Every day | ORAL | 0 refills | Status: AC

## 2021-06-02 MED ORDER — HYDROCORTISONE 2.5 % EX CREA
TOPICAL_CREAM | Freq: Two times a day (BID) | CUTANEOUS | 0 refills | Status: DC
Start: 2021-06-02 — End: 2021-08-11

## 2021-06-02 MED ORDER — AZITHROMYCIN 250 MG PO TABS: ORAL_TABLET | ORAL | 0 refills | Status: AC

## 2021-06-02 MED ORDER — AZELASTINE HCL 0.1 % NA SOLN: 2.0000 | Freq: Two times a day (BID) | NASAL | 0 refills | Status: DC

## 2021-06-02 NOTE — Progress Notes (Addendum)
Chief Complaint  Patient presents with   Rash    face    April Ho is a 45 y.o. female here for a skin complaint. Due to COVID-19 pandemic, we are interacting via web portal for an electronic face-to-face visit. I verified patient's ID using 2 identifiers. Patient agreed to proceed with visit via this method. Patient is at home, I am at office. Patient and I are present for visit.   Duration: 2 weeks Location: face Pruritic? Yes Painful? No Drainage? No New soaps/lotions/topicals/detergents? She did change soaps Sick contacts? No Other associated symptoms: no fevers Therapies tried thus far: none  Cold Duration: 1 week  Associated symptoms: sinus congestion, rhinorrhea, and itchy watery eyes Denies: sinus pain, ear pain, ear drainage, sore throat, wheezing, shortness of breath, myalgia, and fevers Treatment to date: Tylenol cold and flu Sick contacts: People sick at work Tested neg for [employer].   Past Medical History:  Diagnosis Date   Genital warts    GERD (gastroesophageal reflux disease)    Exam No conversational dyspnea Age appropriate judgment and insight Nml affect and mood  Rash - Plan: predniSONE (DELTASONE) 20 MG tablet, triamcinolone (KENALOG) 0.025 % cream  Skin cancer screening  Anal irritation  Upper respiratory tract infection, unspecified type - Plan: azelastine (ASTELIN) 0.1 % nasal spray  Triamcinolone cream for face, refer derm. Pred burst if no better. Change soaps. Refer derm. Refill HC cream. Astelin spray for likely viral s/s's. Probably will start to turn the corner in the next few days. Zpak pocket rx provided.  F/u prn. The patient voiced understanding and agreement to the plan.  [place], DO 06/02/21 10:30 AM

## 2021-06-17 ENCOUNTER — Telehealth: Payer: Self-pay | Admitting: Family Medicine

## 2021-06-17 DIAGNOSIS — R195 Other fecal abnormalities: Secondary | ICD-10-CM

## 2021-06-17 NOTE — Telephone Encounter (Signed)
Pt called to notify that her stool is green. Please advise.

## 2021-06-17 NOTE — Telephone Encounter (Signed)
Does pt need OV or just a lab visit for stool cultures and etc? I see she had anal irritation on last visit

## 2021-06-17 NOTE — Telephone Encounter (Signed)
Just lab visit, please order GI panel. Ty.

## 2021-06-17 NOTE — Telephone Encounter (Signed)
Labs dropped and appt scheduled

## 2021-06-18 ENCOUNTER — Telehealth: Payer: Self-pay | Admitting: *Deleted

## 2021-06-18 DIAGNOSIS — R195 Other fecal abnormalities: Secondary | ICD-10-CM

## 2021-06-18 NOTE — Telephone Encounter (Signed)
Pt has lab appointment on Monday to pick up a stool kit.  The test ordered (GI pathogen panel) is no longer performed by either lab.  [facility] can offier the GI Profile.  It tests for everything that was in the GI pathogen panel. If you are only interested in checking for C.diff then only place order for C.Diff.   Please place appropriate future order.

## 2021-06-18 NOTE — Telephone Encounter (Signed)
Ordered

## 2021-06-21 ENCOUNTER — Other Ambulatory Visit: Payer: BC Managed Care – PPO

## 2021-06-22 ENCOUNTER — Other Ambulatory Visit: Payer: BC Managed Care – PPO

## 2021-06-22 ENCOUNTER — Other Ambulatory Visit: Payer: Self-pay

## 2021-07-02 ENCOUNTER — Other Ambulatory Visit: Payer: Self-pay | Admitting: Family Medicine

## 2021-07-02 DIAGNOSIS — R21 Rash and other nonspecific skin eruption: Secondary | ICD-10-CM

## 2021-07-13 ENCOUNTER — Telehealth: Payer: Self-pay | Admitting: Family Medicine

## 2021-07-13 NOTE — Telephone Encounter (Signed)
Pt was requesting a skin test for tb check for work. Please advise.

## 2021-07-18 IMAGING — CR DG FINGER THUMB 2+V*R*
3 series · 3 of 3 positions shown · non-contrast
Comparison: None.

CLINICAL DATA: Right thumb pain following an injury 9 days ago.

EXAM:
RIGHT THUMB 2+V

[x finger pa right]
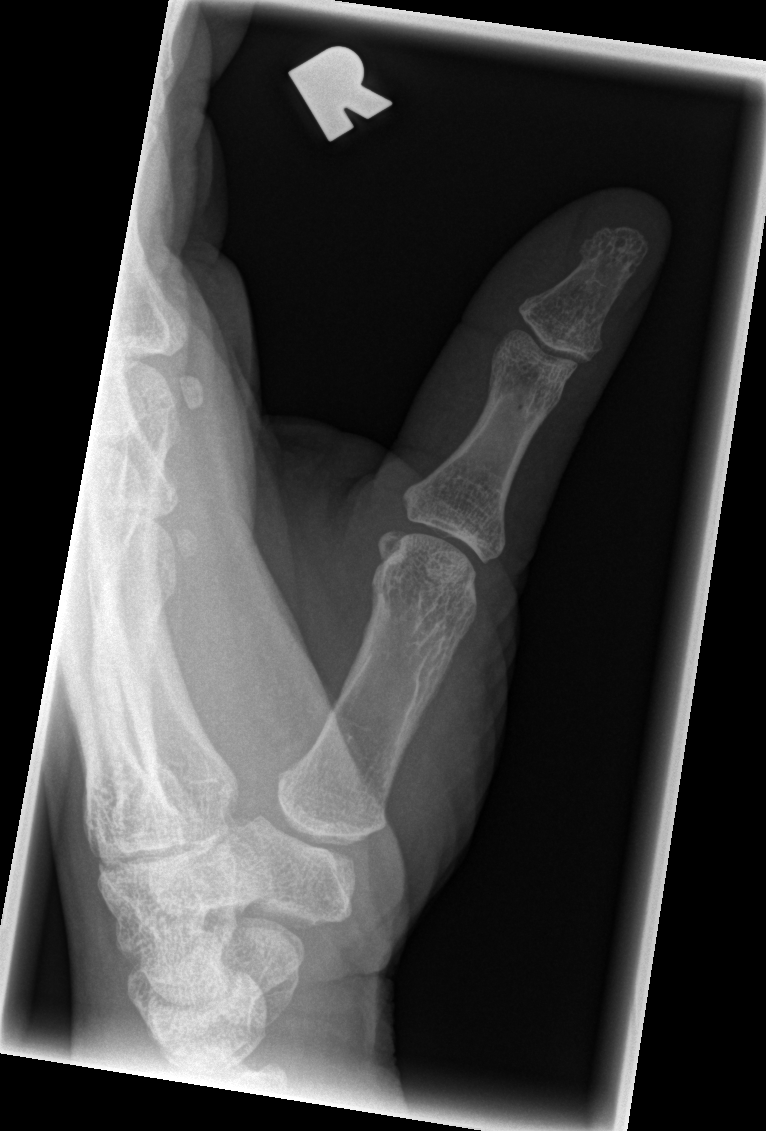

[x finger obl. right]
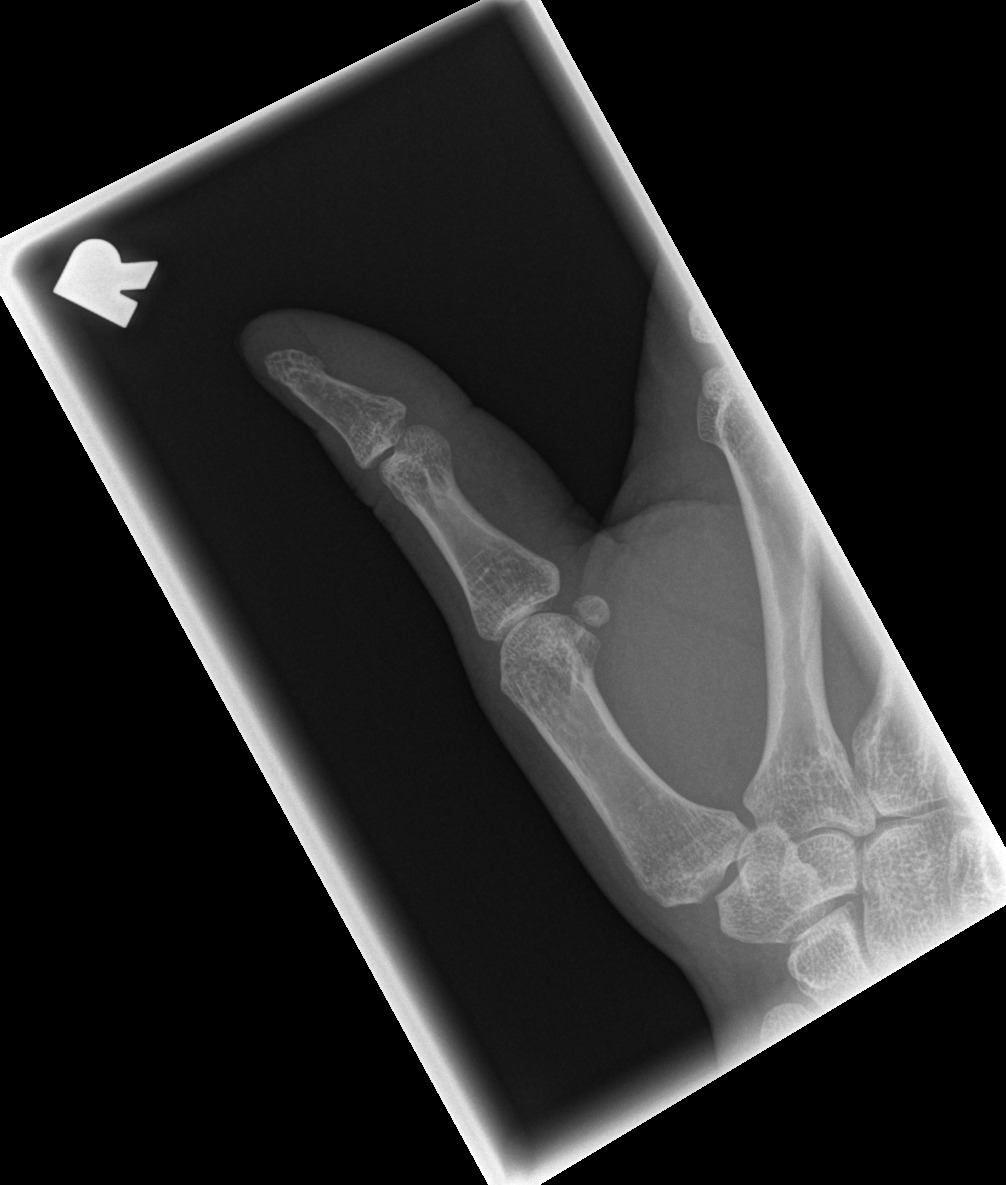

[x finger lateral right]
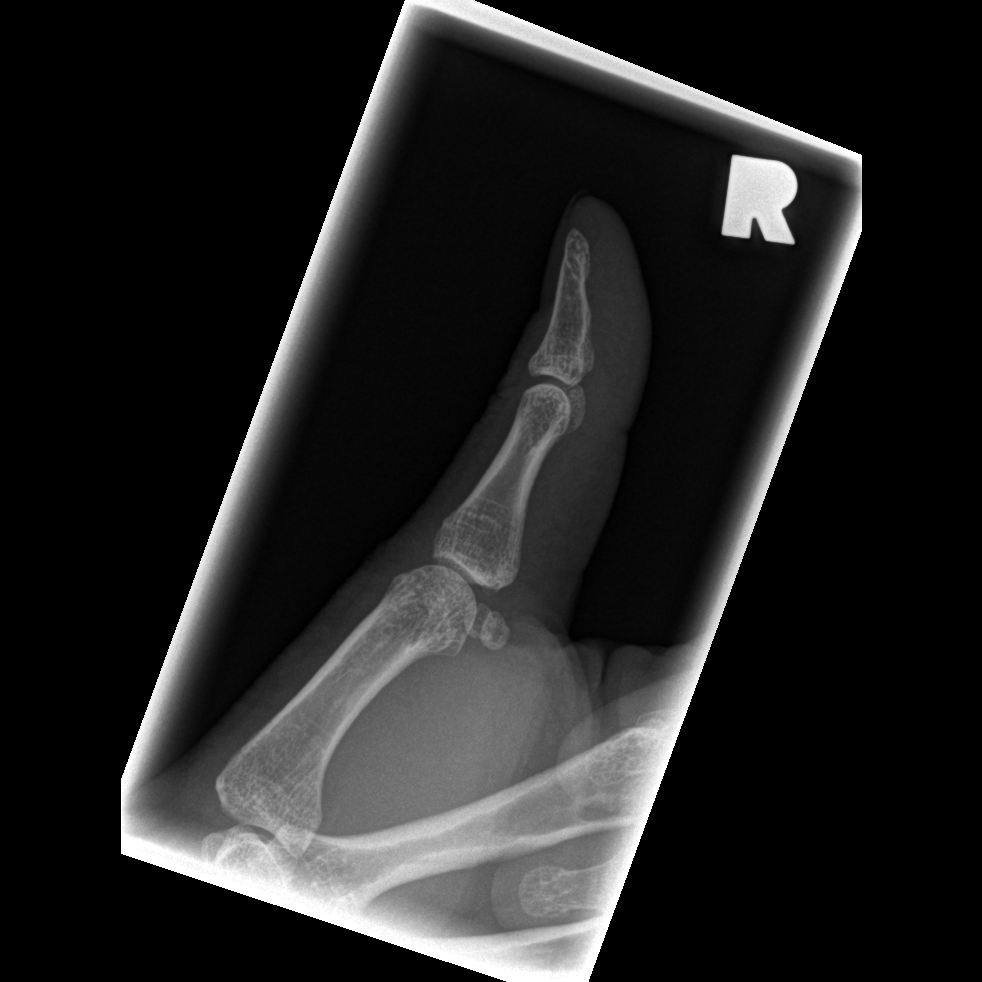

[3 of 3 positions shown; findings below may reference images not displayed]

FINDINGS: There is no evidence of fracture or dislocation. There is no
evidence of arthropathy or other focal bone abnormality. Soft
tissues are unremarkable.
IMPRESSION: Normal examination.

## 2021-07-19 ENCOUNTER — Encounter (HOSPITAL_BASED_OUTPATIENT_CLINIC_OR_DEPARTMENT_OTHER): Payer: Self-pay

## 2021-07-19 ENCOUNTER — Emergency Department (HOSPITAL_BASED_OUTPATIENT_CLINIC_OR_DEPARTMENT_OTHER): Payer: BC Managed Care – PPO

## 2021-07-19 ENCOUNTER — Other Ambulatory Visit: Payer: Self-pay

## 2021-07-19 ENCOUNTER — Emergency Department (HOSPITAL_BASED_OUTPATIENT_CLINIC_OR_DEPARTMENT_OTHER)
Admission: EM | Admit: 2021-07-19 | Discharge: 2021-07-19 | Disposition: A | Discharge status: Home / Self Care | Payer: BC Managed Care – PPO | Attending: Emergency Medicine | Admitting: Emergency Medicine

## 2021-07-19 DIAGNOSIS — S62629A Displaced fracture of medial phalanx of unspecified finger, initial encounter for closed fracture: Secondary | ICD-10-CM

## 2021-07-19 DIAGNOSIS — S6991XA Unspecified injury of right wrist, hand and finger(s), initial encounter: Secondary | ICD-10-CM | POA: Diagnosis not present

## 2021-07-19 DIAGNOSIS — M7989 Other specified soft tissue disorders: Secondary | ICD-10-CM | POA: Diagnosis not present

## 2021-07-19 DIAGNOSIS — S62650A Nondisplaced fracture of medial phalanx of right index finger, initial encounter for closed fracture: Secondary | ICD-10-CM | POA: Diagnosis not present

## 2021-07-19 DIAGNOSIS — M79644 Pain in right finger(s): Secondary | ICD-10-CM | POA: Insufficient documentation

## 2021-07-19 DIAGNOSIS — W230XXA Caught, crushed, jammed, or pinched between moving objects, initial encounter: Secondary | ICD-10-CM | POA: Insufficient documentation

## 2021-07-19 NOTE — ED Triage Notes (Signed)
Pt reports injury to right index finger yesterday at home.. Wore finger splint to work last night and thinks its worse

## 2021-07-19 NOTE — ED Provider Notes (Signed)
[facility] EMERGENCY DEPARTMENT Provider Note   CSN: [id number] Arrival date & time: 07/19/21  2774     History Chief Complaint  Patient presents with   Hand Pain    April Ho is a 45 y.o. female.  Patient jammed her right index finger has bruising and swelling of stiffness to the proximal joint.  Patient is right-hand dominant.  Patient works at [employer].  Injury did not occur at work.  Patient states initially she had good range of motion.  But she had kind of a splint on it that she got from the drugstore.  And now it is stiff at the PIP joint      Past Medical History:  Diagnosis Date   Genital warts    GERD (gastroesophageal reflux disease)     Patient Active Problem List   Diagnosis Date Noted   Genital warts 03/22/2018    Past Surgical History:  Procedure Laterality Date   CESAREAN SECTION  03/25/1998   TONSILLECTOMY     WISDOM TOOTH EXTRACTION       OB History   No obstetric history on file.     Family History  Problem Relation Age of Onset   Asthma Paternal Grandmother    Multiple sclerosis Mother    Diabetes Father    Kidney failure Father    Pancreatic disease Father    Hypertension Father    Heart attack Father    Cancer Neg Hx    Colon cancer Neg Hx    Esophageal cancer Neg Hx    Rectal cancer Neg Hx    Stomach cancer Neg Hx     Social History   Tobacco Use   Smoking status: Never   Smokeless tobacco: Never  Vaping Use   Vaping Use: Never used  Substance Use Topics   Alcohol use: No   Drug use: Not Currently    Types: Marijuana    Home Medications Prior to Admission medications   Medication Sig Start Date End Date Taking? Authorizing Provider  albuterol (VENTOLIN HFA) 108 (90 Base) MCG/ACT inhaler Inhale 2 puffs into the lungs every 6 (six) hours as needed for wheezing or shortness of breath. 05/11/20   Shelda Pal, DO  azelastine (ASTELIN) 0.1 % nasal spray Place 2 sprays into both nostrils 2 (two)  times daily. Use in each nostril as directed 06/02/21   Shelda Pal, DO  EPINEPHrine 0.3 mg/0.3 mL IJ SOAJ injection Inject 0.3 mLs (0.3 mg total) into the muscle as needed for anaphylaxis. 01/29/19   Shelda Pal, DO  fluconazole (DIFLUCAN) 150 MG tablet Take 1 tab, repeat in 48 hours if no improvement. 01/29/21   Shelda Pal, DO  hydrocortisone 2.5 % cream Apply topically 2 (two) times daily. 06/02/21   Shelda Pal, DO  naproxen (NAPROSYN) 500 MG tablet Take 1 tablet (500 mg total) by mouth 2 (two) times daily with a meal. 07/17/20   Wendling, Crosby Oyster, DO  pantoprazole (PROTONIX) 40 MG tablet Take 1 tablet (40 mg total) by mouth 2 (two) times daily before a meal. 06/12/20   Wendling, Crosby Oyster, DO  triamcinolone (KENALOG) 0.025 % cream APPLY TOPICALLY TO THE AFFECTED AREA TWICE DAILY 07/05/21   Shelda Pal, DO    Allergies    Cat hair extract, Dog epithelium, and Dust mite extract  Review of Systems   Review of Systems  Constitutional:  Negative for chills and fever.  HENT:  Negative for ear pain and  sore throat.   Eyes:  Negative for pain and visual disturbance.  Respiratory:  Negative for cough and shortness of breath.   Cardiovascular:  Negative for chest pain and palpitations.  Gastrointestinal:  Negative for abdominal pain and vomiting.  Genitourinary:  Negative for dysuria and hematuria.  Musculoskeletal:  Positive for joint swelling. Negative for arthralgias and back pain.  Skin:  Negative for color change and rash.  Neurological:  Negative for seizures and syncope.  All other systems reviewed and are negative.  Physical Exam Updated Vital Signs BP 113/84 (BP Location: Left Arm)   Pulse 73   Temp 98.3 F (36.8 C)   SpO2 100%   Physical Exam Vitals and nursing note reviewed.  Constitutional:      General: She is not in acute distress.    Appearance: Normal appearance. She is well-developed.  HENT:      Head: Normocephalic and atraumatic.  Eyes:     Conjunctiva/sclera: Conjunctivae normal.  Cardiovascular:     Rate and Rhythm: Normal rate and regular rhythm.     Heart sounds: No murmur heard. Pulmonary:     Effort: Pulmonary effort is normal. No respiratory distress.     Breath sounds: Normal breath sounds.  Abdominal:     Palpations: Abdomen is soft.     Tenderness: There is no abdominal tenderness.  Musculoskeletal:        General: Swelling, tenderness and signs of injury present.     Cervical back: Neck supple.     Comments: Swelling bruising and some tenderness to the PIP joint of the right index finger.  Sensation intact distally good cap refill.  Radial pulses 2+.  Limited flexion actively but good flexion and extension passively.  Patient has good movement at the DIP joint.  And at the MP joint.  Skin:    General: Skin is warm and dry.     Capillary Refill: Capillary refill takes less than 2 seconds.  Neurological:     Mental Status: She is alert.  Psychiatric:        Mood and Affect: Mood normal.    ED Results / Procedures / Treatments   Labs (all labs ordered are listed, but only abnormal results are displayed) Labs Reviewed - No data to display  EKG None  Radiology No results found.  Procedures Procedures   Medications Ordered in ED Medications - No data to display  ED Course  I have reviewed the triage vital signs and the nursing notes.  Pertinent labs & imaging results that were available during my care of the patient were reviewed by me and considered in my medical decision making (see chart for details).    MDM Rules/Calculators/A&P                           Clinically patient with a jammed finger right index finger at the PIP joint.  We will get x-rays to rule out any kind of fracture.  If negative we will go ahead and splint with finger splint here.  With the finger partially flexed at that joint.  We will have patient follow-up with hand  surgery.  X-ray does show an acute nondisplaced fracture of the second middle phalanx.  We will go ahead and apply finger splint as planned.  Patient will follow-up with hand surgery.  Patient's been seen by [facility] in the past   Final Clinical Impression(s) / ED Diagnoses Final diagnoses:  Finger injury, right,  initial encounter    Rx / DC Orders ED Discharge Orders     None        Fredia Sorrow, MD 07/19/21 [phone]

## 2021-07-19 NOTE — Discharge Instructions (Addendum)
Give Dr. Brennan Bailey office a call for follow-up.  Keep the splint in place.  Work note provided.  X-ray does show a tiny fracture there at the joint that is black and blue.  This will most likely heal on its own.  But follow-up is important.

## 2021-07-20 ENCOUNTER — Ambulatory Visit (INDEPENDENT_AMBULATORY_CARE_PROVIDER_SITE_OTHER): Payer: BC Managed Care – PPO

## 2021-07-20 DIAGNOSIS — Z111 Encounter for screening for respiratory tuberculosis: Secondary | ICD-10-CM | POA: Diagnosis not present

## 2021-07-20 NOTE — Progress Notes (Signed)
Pt here for TB Skin per Wendling. Placed at left forearm. Pt will come back for reading on Thursday.

## 2021-07-22 ENCOUNTER — Ambulatory Visit (INDEPENDENT_AMBULATORY_CARE_PROVIDER_SITE_OTHER): Payer: BC Managed Care – PPO

## 2021-07-22 ENCOUNTER — Telehealth: Payer: Self-pay

## 2021-07-22 DIAGNOSIS — Z111 Encounter for screening for respiratory tuberculosis: Secondary | ICD-10-CM

## 2021-07-22 LAB — TB SKIN TEST
Induration: 0 mm
TB Skin Test: NEGATIVE

## 2021-07-22 NOTE — Progress Notes (Signed)
PPD Reading Note PPD read and results entered in [place]. Result: 0 mm induration. Interpretation: Neg If test not read within 48-72 hours of initial placement, patient advised to repeat in other arm 1-3 weeks after this test. Allergic reaction: no   Letter drafted and given to pt.

## 2021-07-22 NOTE — Telephone Encounter (Addendum)
Pt has come to the office for a PPD Read. She got that taken care of. It was neg and a letter was drafted and given to pt.   She then stated that she had broken her right index finger and came to the ED here for the X-rays. Pt stated that her finger really hurts her and would like something to help it. She has tried tylenol and ibuprofen and it did not help her.   She does have a follow up finger appt on Monday with the hand specialist in [place].

## 2021-07-23 ENCOUNTER — Telehealth (INDEPENDENT_AMBULATORY_CARE_PROVIDER_SITE_OTHER): Payer: BC Managed Care – PPO | Admitting: Family Medicine

## 2021-07-23 ENCOUNTER — Encounter: Payer: Self-pay | Admitting: Family Medicine

## 2021-07-23 DIAGNOSIS — S62650A Nondisplaced fracture of medial phalanx of right index finger, initial encounter for closed fracture: Secondary | ICD-10-CM | POA: Diagnosis not present

## 2021-07-23 MED ORDER — OXYCODONE HCL 5 MG PO CAPS: 5.0000 mg | ORAL_CAPSULE | ORAL | 0 refills | Status: DC | PRN

## 2021-07-23 MED ORDER — MELOXICAM 15 MG PO TABS: 15.0000 mg | ORAL_TABLET | Freq: Every day | ORAL | 0 refills | Status: DC

## 2021-07-23 NOTE — Progress Notes (Signed)
Musculoskeletal Exam  Patient: April Ho DOB: [birth year]  DOS: 07/23/2021  SUBJECTIVE:  Chief Complaint:   Chief Complaint  Patient presents with   Hand Pain    April Ho is a 45 y.o.  female for evaluation and treatment of R index finger pain. Due to COVID-19 pandemic, we are interacting via telephone. I verified patient's ID using 2 identifiers. Patient agreed to proceed with visit via this method. Patient is at home, I am at office. Patient and I are present for visit.   Onset:  5 days ago. Smashed against a stove.  Location: R index finger Character:  sharp and stabbing  Progression of issue:  is unchanged Associated symptoms: swelling, bruising Denies fevers, redness Treatment: to date has been splinting/wrapping, Tylenol.   Neurovascular symptoms: no  Past Medical History:  Diagnosis Date   Genital warts    GERD (gastroesophageal reflux disease)     Objective: No conversational dyspnea Age appropriate judgment and insight Nml affect and mood  Assessment:  Closed nondisplaced fracture of middle phalanx of right index finger, initial encounter - Plan: meloxicam (MOBIC) 15 MG tablet, oxycodone (OXY-IR) 5 MG capsule  Plan: Has appt w hand on Mon. Add Mobic, oxy prn, buddy taping, ice, Tylenol.  F/u prn. Total time: 11 min The patient voiced understanding and agreement to the plan.   [place], DO 07/23/21  11:52 AM

## 2021-07-23 NOTE — Telephone Encounter (Signed)
Called the patient and scheduled her at 11:45 today for Video visit.

## 2021-07-26 ENCOUNTER — Ambulatory Visit: Payer: BC Managed Care – PPO | Admitting: Family Medicine

## 2021-07-26 DIAGNOSIS — S62640A Nondisplaced fracture of proximal phalanx of right index finger, initial encounter for closed fracture: Secondary | ICD-10-CM | POA: Diagnosis not present

## 2021-08-09 DIAGNOSIS — S62640D Nondisplaced fracture of proximal phalanx of right index finger, subsequent encounter for fracture with routine healing: Secondary | ICD-10-CM | POA: Diagnosis not present

## 2021-08-11 ENCOUNTER — Telehealth: Payer: Self-pay | Admitting: Family Medicine

## 2021-08-11 DIAGNOSIS — R21 Rash and other nonspecific skin eruption: Secondary | ICD-10-CM

## 2021-08-11 MED ORDER — HYDROCORTISONE 2.5 % EX CREA: TOPICAL_CREAM | Freq: Two times a day (BID) | CUTANEOUS | 0 refills | Status: DC

## 2021-08-11 MED ORDER — TRIAMCINOLONE ACETONIDE 0.025 % EX CREA: TOPICAL_CREAM | CUTANEOUS | 0 refills | Status: DC

## 2021-08-11 NOTE — Telephone Encounter (Signed)
Pt would like refill of medication. She wants to see if she can get a bigger tube of the medicine below.  triamcinolone (KENALOG) 0.025 % cream   hydrocortisone 2.5 % cream   [facility] [place], [address]  [place], [place] [address]  Phone:  [phone]  Fax:  [phone]

## 2021-08-11 NOTE — Telephone Encounter (Signed)
done

## 2021-09-07 ENCOUNTER — Other Ambulatory Visit: Payer: Self-pay | Admitting: Family Medicine

## 2021-09-07 ENCOUNTER — Telehealth: Payer: Self-pay | Admitting: Family Medicine

## 2021-09-07 DIAGNOSIS — R21 Rash and other nonspecific skin eruption: Secondary | ICD-10-CM

## 2021-09-07 MED ORDER — TRIAMCINOLONE ACETONIDE 0.025 % EX CREA: TOPICAL_CREAM | CUTANEOUS | 0 refills | Status: DC

## 2021-09-07 MED ORDER — HYDROCORTISONE 2.5 % EX CREA: TOPICAL_CREAM | Freq: Two times a day (BID) | CUTANEOUS | 0 refills | Status: DC

## 2021-09-07 NOTE — Telephone Encounter (Signed)
Sent in

## 2021-09-07 NOTE — Telephone Encounter (Signed)
Medication:  triamcinolone (KENALOG) 0.025 % cream [[id number]]  hydrocortisone 2.5 % cream [[id number]]  Has the patient contacted their pharmacy? No. (If no, request that the patient contact the pharmacy for the refill.) (If yes, when and what did the pharmacy advise?)     Preferred Pharmacy (with phone number or street name):  [facility] [place], [address]  [place], [place] [address]  Phone:  [phone]  Fax:  [phone]    Agent: Please be advised that RX refills may take up to 3 business days. We ask that you follow-up with your pharmacy.   Pt would like to to know if wendling would put refills in for her to contact the pharmacy when needed.

## 2021-10-10 ENCOUNTER — Encounter (HOSPITAL_BASED_OUTPATIENT_CLINIC_OR_DEPARTMENT_OTHER): Payer: Self-pay | Admitting: Emergency Medicine

## 2021-10-10 ENCOUNTER — Other Ambulatory Visit: Payer: Self-pay

## 2021-10-10 ENCOUNTER — Emergency Department (HOSPITAL_BASED_OUTPATIENT_CLINIC_OR_DEPARTMENT_OTHER)
Admission: EM | Admit: 2021-10-10 | Discharge: 2021-10-10 | Disposition: A | Discharge status: Home / Self Care | Payer: BC Managed Care – PPO | Attending: Emergency Medicine | Admitting: Emergency Medicine

## 2021-10-10 DIAGNOSIS — R6889 Other general symptoms and signs: Secondary | ICD-10-CM | POA: Diagnosis not present

## 2021-10-10 DIAGNOSIS — H61892 Other specified disorders of left external ear: Secondary | ICD-10-CM

## 2021-10-10 DIAGNOSIS — T162XXA Foreign body in left ear, initial encounter: Secondary | ICD-10-CM | POA: Diagnosis not present

## 2021-10-10 NOTE — ED Triage Notes (Signed)
Pt c/o possible bug in left ear noticed today when she got out of the shower. ?

## 2021-10-10 NOTE — ED Provider Notes (Signed)
?[facility] EMERGENCY DEPARTMENT ?Provider Note ? ? ?CSN: [id number] ?Arrival date & time: 10/10/21  1244 ? ?  ? ?History ? ?Chief Complaint  ?Patient presents with  ? Foreign Body in [place]  ?  Left ear  ? ? ?April Ho is a 46 y.o. female.  Patient presents emergency room for a possible foreign body in her left ear.  She states that after she got out of the shower, she started feeling like something was crawling around in her left ear.  She did not actually see the bug.  She says it is painful.  No drainage from the ear.  It is not affecting her hearing. ? ? ?Foreign Body in [place] ? ? ?  ? ?Home Medications ?Prior to Admission medications   ?Medication Sig Start Date End Date Taking? Authorizing Provider  ?albuterol (VENTOLIN HFA) 108 (90 Base) MCG/ACT inhaler Inhale 2 puffs into the lungs every 6 (six) hours as needed for wheezing or shortness of breath. 05/11/20   Shelda Pal, DO  ?azelastine (ASTELIN) 0.1 % nasal spray Place 2 sprays into both nostrils 2 (two) times daily. Use in each nostril as directed 06/02/21   Shelda Pal, DO  ?EPINEPHrine 0.3 mg/0.3 mL IJ SOAJ injection Inject 0.3 mLs (0.3 mg total) into the muscle as needed for anaphylaxis. 01/29/19   Shelda Pal, DO  ?fluconazole (DIFLUCAN) 150 MG tablet Take 1 tab, repeat in 48 hours if no improvement. 01/29/21   Shelda Pal, DO  ?hydrocortisone 2.5 % cream APPLY TOPICALLY TO THE AFFECTED AREA TWICE DAILY 09/07/21   Shelda Pal, DO  ?hydrocortisone 2.5 % cream Apply topically 2 (two) times daily. 09/07/21   Shelda Pal, DO  ?meloxicam (MOBIC) 15 MG tablet Take 1 tablet (15 mg total) by mouth daily. 07/23/21   Shelda Pal, DO  ?oxycodone (OXY-IR) 5 MG capsule Take 1 capsule (5 mg total) by mouth every 4 (four) hours as needed. 07/23/21   Shelda Pal, DO  ?pantoprazole (PROTONIX) 40 MG tablet Take 1 tablet (40 mg total) by mouth 2 (two) times daily before a  meal. 06/12/20   Wendling, Crosby Oyster, DO  ?triamcinolone (KENALOG) 0.025 % cream APPLY TOPICALLY TO THE AFFECTED AREA TWICE DAILY 09/07/21   Shelda Pal, DO  ?triamcinolone (KENALOG) 0.025 % cream Apply topically to the affected area twice daily 09/07/21   Shelda Pal, DO  ?   ? ?Allergies    ?Cat hair extract, Dog epithelium, and Dust mite extract   ? ?Review of Systems   ?Review of Systems  ?HENT:  Positive for ear pain. Negative for ear discharge.   ?All other systems reviewed and are negative. ? ?Physical Exam ?Updated Vital Signs ?BP 130/80   Pulse 85   Temp 98.4 ?F (36.9 ?C) (Oral)   Resp 18   Ht '5\' 6"'$  (1.676 m)   Wt 84.7 kg   SpO2 99%   BMI 30.15 kg/m?  ?Physical Exam ?Vitals and nursing note reviewed.  ?Constitutional:   ?   General: She is not in acute distress. ?   Appearance: Normal appearance. She is well-developed. She is not ill-appearing, toxic-appearing or diaphoretic.  ?HENT:  ?   Head: Normocephalic and atraumatic.  ?   Ears:  ?   Comments: I am not able to visualize any foreign body in the left ear.  There is some earwax noted, however does not obstructive. ?   Nose: No nasal deformity.  ?  Mouth/Throat:  ?   Lips: Pink. No lesions.  ?Eyes:  ?   General: Gaze aligned appropriately. No scleral icterus.    ?   Right eye: No discharge.     ?   Left eye: No discharge.  ?   Conjunctiva/sclera: Conjunctivae normal.  ?   Right eye: Right conjunctiva is not injected. No exudate or hemorrhage. ?   Left eye: Left conjunctiva is not injected. No exudate or hemorrhage. ?Pulmonary:  ?   Effort: Pulmonary effort is normal. No respiratory distress.  ?Skin: ?   General: Skin is warm and dry.  ?Neurological:  ?   Mental Status: She is alert and oriented to person, place, and time.  ?Psychiatric:     ?   Mood and Affect: Mood normal.     ?   Speech: Speech normal.     ?   Behavior: Behavior normal. Behavior is cooperative.  ? ? ?ED Results / Procedures / Treatments   ?Labs ?(all  labs ordered are listed, but only abnormal results are displayed) ?Labs Reviewed - No data to display ? ?EKG ?None ? ?Radiology ?No results found. ? ?Procedures ?Procedures  ? ? ?Medications Ordered in ED ?Medications - No data to display ? ?ED Course/ Medical Decision Making/ A&P ?  ?                        ?Medical Decision Making ? ? ?MDM  ?This is a 46 y.o. female is here with foreign body sensation in her left ear.  She feels like bug is in there and feels something crawling around.   ?My Impression, Plan, and ED Course: There is concern for possible foreign body in the left ear.  Patient says she feels a bug crawling around in her ear.  I cannot visualize this on tympanic membrane exam, however there is some wax obstruction partially.  We will try to clean out ears to see if this helps her symptoms. ? ?No foreign body was removed on cleaning of the ears.  Following washout, patient has no more foreign body sensation.  I rechecked her ears and still did not see anything in there. ? ?At this point, I think I recommend follow-up with her PCP.  If she continues to have the symptoms I have given her ENT follow-up.  She can return here if she starts feeling symptoms again. ? ? ?Charting Requirements ?Additional history is obtained from:  Independent historian ?External Records from outside source obtained and reviewed including: n/a ?Social Determinants of Health:  none ?Pertinant PMH that complicates patient's illness: n/a ? ?Patient Care ?Problems that were addressed during this visit: ?- Foreign body sensation in left ear: Acute illness ?Disposition: f/u with PCP or ENT ? ?I have discussed this patient with my attending physician, Dr. Regenia Skeeter who has made changes to the plan accordingly. ? ?Portions of this note were generated with [employer]. Dictation errors may occur despite best attempts at proofreading. ?  ? ?  ?  ?  ?  ?  ?  ? ? ? ? ? ? ? ? ? ? ? ?Final Clinical Impression(s) / ED  Diagnoses ?Final diagnoses:  ?Foreign body sensation in left ear canal  ? ? ?Rx / DC Orders ?ED Discharge Orders   ? ? None  ? ?  ? ? ?  ?Adolphus Birchwood, PA-C ?10/10/21 1908 ? ?  ?Sherwood Gambler, MD ?10/13/21 [phone] ? ?

## 2021-10-10 NOTE — Discharge Instructions (Signed)
If you get home and you are continuing to have the sensations in your ear canal, please call the ENT provider that I have put for you in your notes.  Otherwise, he should follow-up with your PCP. ? ?

## 2021-10-18 ENCOUNTER — Encounter: Payer: Self-pay | Admitting: Family Medicine

## 2021-10-18 ENCOUNTER — Ambulatory Visit: Payer: BC Managed Care – PPO | Admitting: Family Medicine

## 2021-10-18 VITALS — BP 111/62 | HR 90 | Temp 98.9°F | Ht 66.0 in | Wt 186.0 lb

## 2021-10-18 DIAGNOSIS — L409 Psoriasis, unspecified: Secondary | ICD-10-CM | POA: Diagnosis not present

## 2021-10-18 DIAGNOSIS — R21 Rash and other nonspecific skin eruption: Secondary | ICD-10-CM | POA: Diagnosis not present

## 2021-10-18 MED ORDER — METHYLPREDNISOLONE ACETATE 80 MG/ML IJ SUSP
80.0000 mg | Freq: Once | INTRAMUSCULAR | Status: AC
  Administered 2021-10-18: 80 mg via INTRAMUSCULAR

## 2021-10-18 MED ORDER — VALACYCLOVIR HCL 500 MG PO TABS: 500.0000 mg | ORAL_TABLET | Freq: Every day | ORAL | 2 refills | Status: DC

## 2021-10-18 MED ORDER — TRIAMCINOLONE ACETONIDE 0.025 % EX CREA: TOPICAL_CREAM | CUTANEOUS | 0 refills | Status: DC

## 2021-10-18 MED ORDER — LEVOCETIRIZINE DIHYDROCHLORIDE 5 MG PO TABS: ORAL_TABLET | ORAL | 2 refills | Status: DC

## 2021-10-18 MED ORDER — PREDNISONE 20 MG PO TABS: 40.0000 mg | ORAL_TABLET | Freq: Every day | ORAL | 0 refills | Status: AC

## 2021-10-18 NOTE — Progress Notes (Signed)
Chief Complaint  ?Patient presents with  ? Rash  ? ? ?April Ho is a 46 y.o. female here for a skin complaint. ? ?Duration: 1 year   ?Location: entire better ?Pruritic? Yes ?Painful? Yes ?Drainage? No ?New soaps/lotions/topicals/detergents? No ?Sick contacts? No ?Other associated symptoms: Bleeding ?Therapies tried thus far: none ? ?Past Medical History:  ?Diagnosis Date  ? Genital warts   ? GERD (gastroesophageal reflux disease)   ? ? ?BP 111/62   Pulse 90   Temp 98.9 ?F (37.2 ?C) (Oral)   Ht '5\' 6"'$  (1.676 m)   Wt 186 lb (84.4 kg)   SpO2 99%   BMI 30.02 kg/m?  ?Gen: awake, alert, appearing stated age ?Lungs: No accessory muscle use ?Skin: See below. No drainage, erythema, fluctuance ?Psych: Age appropriate judgment and insight ? ? ? ? ? ? ?Psoriasis - Plan: predniSONE (DELTASONE) 20 MG tablet, levocetirizine (XYZAL) 5 MG tablet, Ambulatory referral to Dermatology, methylPREDNISolone acetate (DEPO-MEDROL) injection 80 mg ? ?Rash - Plan: triamcinolone (KENALOG) 0.025 % cream, methylPREDNISolone acetate (DEPO-MEDROL) injection 80 mg ? ?Chronic, uncontrolled. Depo injection today, 5 d pred burst starting tomorrow. Xyzal daily, steroid cream prn. Refer to derm. She will let me know if she is having trouble getting in soon and I will research some PO tx options for her. ?F/u prn. ?The patient voiced understanding and agreement to the plan. ? ?Shelda Pal, DO ?10/18/21 ?2:36 PM ? ?

## 2021-10-18 NOTE — Patient Instructions (Addendum)
Try not to scratch as this can make things worse. Avoid scented products while dealing with this. You may resume when the itchiness resolves. Cold/cool compresses can help.  ? ?If you do not hear anything about your referral in the next 1-2 weeks, call our office and ask for an update. ? ?Let me know if you cannot get in with the dermatology team in a reasonable time frame. I will do some research and start a medication.  ? ?Let us know if you need anything. ? ?

## 2021-10-21 DIAGNOSIS — L408 Other psoriasis: Secondary | ICD-10-CM | POA: Diagnosis not present

## 2021-10-21 DIAGNOSIS — R21 Rash and other nonspecific skin eruption: Secondary | ICD-10-CM | POA: Diagnosis not present

## 2021-10-26 DIAGNOSIS — L400 Psoriasis vulgaris: Secondary | ICD-10-CM | POA: Diagnosis not present

## 2021-10-26 DIAGNOSIS — Z79899 Other long term (current) drug therapy: Secondary | ICD-10-CM | POA: Diagnosis not present

## 2021-11-04 ENCOUNTER — Telehealth: Payer: Self-pay | Admitting: Family Medicine

## 2021-11-04 MED ORDER — FLUCONAZOLE 150 MG PO TABS: ORAL_TABLET | ORAL | 0 refills | Status: DC

## 2021-11-04 NOTE — Telephone Encounter (Signed)
Called the patient and informed PCP is out of the office and offered appt with another provider. ?She declined appointment at this time and wants message sent to PCP to review when he returns on 11/09/21. The patient states she has a yeast infection and is wanting the Diflucan sent in. She thinks is being caused by Valtrex. ?She did agree to go to an UC/ or ED over the weekend if worsens.  ? ? ? ?

## 2021-11-04 NOTE — Telephone Encounter (Signed)
Pt states she needs antibiotics prescribed for yeast infection.  ? ?Pt would like for someone to contact her to discuss further information.  ? ?Please advise.  ?

## 2021-11-24 ENCOUNTER — Encounter: Payer: Self-pay | Admitting: Family Medicine

## 2021-11-24 ENCOUNTER — Ambulatory Visit: Payer: BC Managed Care – PPO | Admitting: Family Medicine

## 2021-11-24 VITALS — BP 122/71 | HR 75 | Temp 98.4°F | Ht 66.0 in | Wt 186.0 lb

## 2021-11-24 DIAGNOSIS — L739 Follicular disorder, unspecified: Secondary | ICD-10-CM

## 2021-11-24 DIAGNOSIS — R21 Rash and other nonspecific skin eruption: Secondary | ICD-10-CM

## 2021-11-24 MED ORDER — DOXYCYCLINE HYCLATE 100 MG PO TABS: 100.0000 mg | ORAL_TABLET | Freq: Two times a day (BID) | ORAL | 0 refills | Status: DC

## 2021-11-24 MED ORDER — FLUCONAZOLE 150 MG PO TABS: ORAL_TABLET | ORAL | 0 refills | Status: DC

## 2021-11-24 MED ORDER — TRIAMCINOLONE ACETONIDE 0.025 % EX CREA: TOPICAL_CREAM | CUTANEOUS | 0 refills | Status: DC

## 2021-11-24 MED ORDER — SKYRIZI 150 MG/ML ~~LOC~~ SOSY: 150.0000 mg | PREFILLED_SYRINGE | SUBCUTANEOUS | Status: DC

## 2021-11-24 NOTE — Patient Instructions (Signed)
Keep the area clean and dry.  ? ?Use the cream twice daily. ? ?This should improve over the next week or so. If not, use the antibiotic (doxycycline). ? ?Let us know if you need anything. ?

## 2021-11-24 NOTE — Progress Notes (Signed)
Chief Complaint  ?Patient presents with  ? knot in female area where she shaves  ? ? ?April Ho is a 46 y.o. female here for a skin complaint. ? ?Duration: 1 day ?Location: genital region ?Pruritic? No ?Painful? No ?Drainage? No ?New soaps/lotions/topicals/detergents? No ?Sick contacts? No ?Other associated symptoms: white bump x 2; no fevers, spreading redness ?Therapies tried thus far: None ? ?Past Medical History:  ?Diagnosis Date  ? Genital warts   ? GERD (gastroesophageal reflux disease)   ? ? ?BP 122/71   Pulse 75   Temp 98.4 ?F (36.9 ?C) (Oral)   Ht '5\' 6"'$  (1.676 m)   Wt 186 lb (84.4 kg)   SpO2 99%   BMI 30.02 kg/m?  ?Gen: awake, alert, appearing stated age ?Lungs: No accessory muscle use ?Skin: politely declined exam.  ?Psych: Age appropriate judgment and insight ? ?Folliculitis ? ?Rash - Plan: triamcinolone (KENALOG) 0.025 % cream ? ?Sounds like folliculitis barbae. Keep c/d, use mupirocin bid for 5-7 d. If no improvement, I sent a 7 d course of doxy in.   ?The patient voiced understanding and agreement to the plan. ? ?Shelda Pal, DO ?11/24/21 ?11:42 AM ? ?

## 2022-02-19 ENCOUNTER — Other Ambulatory Visit: Payer: Self-pay

## 2022-02-19 ENCOUNTER — Emergency Department (HOSPITAL_BASED_OUTPATIENT_CLINIC_OR_DEPARTMENT_OTHER): Payer: BC Managed Care – PPO

## 2022-02-19 ENCOUNTER — Emergency Department (HOSPITAL_BASED_OUTPATIENT_CLINIC_OR_DEPARTMENT_OTHER)
Admission: EM | Admit: 2022-02-19 | Discharge: 2022-02-19 | Disposition: A | Discharge status: Home / Self Care | Payer: BC Managed Care – PPO | Attending: Emergency Medicine | Admitting: Emergency Medicine

## 2022-02-19 ENCOUNTER — Encounter (HOSPITAL_BASED_OUTPATIENT_CLINIC_OR_DEPARTMENT_OTHER): Payer: Self-pay | Admitting: Emergency Medicine

## 2022-02-19 DIAGNOSIS — M7989 Other specified soft tissue disorders: Secondary | ICD-10-CM | POA: Diagnosis not present

## 2022-02-19 DIAGNOSIS — S6392XA Sprain of unspecified part of left wrist and hand, initial encounter: Secondary | ICD-10-CM | POA: Diagnosis not present

## 2022-02-19 DIAGNOSIS — S6992XA Unspecified injury of left wrist, hand and finger(s), initial encounter: Secondary | ICD-10-CM | POA: Diagnosis not present

## 2022-02-19 DIAGNOSIS — S63502A Unspecified sprain of left wrist, initial encounter: Secondary | ICD-10-CM

## 2022-02-19 DIAGNOSIS — X500XXA Overexertion from strenuous movement or load, initial encounter: Secondary | ICD-10-CM | POA: Diagnosis not present

## 2022-02-19 DIAGNOSIS — Y92513 Shop (commercial) as the place of occurrence of the external cause: Secondary | ICD-10-CM | POA: Insufficient documentation

## 2022-02-19 DIAGNOSIS — M25532 Pain in left wrist: Secondary | ICD-10-CM | POA: Diagnosis not present

## 2022-02-19 HISTORY — DX: Psoriasis, unspecified: L40.9

## 2022-02-19 NOTE — Discharge Instructions (Signed)
You were seen in the emergency department for left wrist pain.  Your x-ray did not show any obvious fracture or dislocation.  Please use the wrist splint for comfort.  Tylenol and ibuprofen for pain.  Ice to the affected area.  Follow-up with your regular doctor and return to the emergency department if any worsening or concerning symptoms

## 2022-02-19 NOTE — ED Provider Notes (Signed)
[facility] EMERGENCY DEPARTMENT Provider Note   CSN: [id number] Arrival date & time: 02/19/22  0805     History  Chief Complaint  Patient presents with   Wrist Pain    April Ho is a 46 y.o. female.  She is here with a complaint of left wrist pain for 2 days.  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  No known trauma.  She is right-hand dominant.  She feels it swollen and tender mostly over the distal radius.  No numbness or weakness.  Prior history of trigger thumb on that side although thumb not bothering her now.  The history is provided by the patient.  Wrist Pain This is a new problem. The current episode started 2 days ago. The problem occurs constantly. The problem has not changed since onset.The symptoms are aggravated by bending and twisting. Nothing relieves the symptoms. She has tried rest for the symptoms. The treatment provided no relief.       Home Medications Prior to Admission medications   Medication Sig Start Date End Date Taking? Authorizing Provider  albuterol (VENTOLIN HFA) 108 (90 Base) MCG/ACT inhaler Inhale 2 puffs into the lungs every 6 (six) hours as needed for wheezing or shortness of breath. 05/11/20   Shelda Pal, DO  azelastine (ASTELIN) 0.1 % nasal spray Place 2 sprays into both nostrils 2 (two) times daily. Use in each nostril as directed 06/02/21   Shelda Pal, DO  doxycycline (VIBRA-TABS) 100 MG tablet Take 1 tablet (100 mg total) by mouth 2 (two) times daily. 11/24/21   Shelda Pal, DO  EPINEPHrine 0.3 mg/0.3 mL IJ SOAJ injection Inject 0.3 mLs (0.3 mg total) into the muscle as needed for anaphylaxis. 01/29/19   Shelda Pal, DO  fluconazole (DIFLUCAN) 150 MG tablet Take 1 tab, repeat in 72 hours if no improvement. 11/24/21   Shelda Pal, DO  levocetirizine (XYZAL) 5 MG tablet TAKE 1 TABLET(5 MG) BY MOUTH EVERY EVENING 10/18/21   Wendling, Crosby Oyster, DO  meloxicam  (MOBIC) 15 MG tablet Take 1 tablet (15 mg total) by mouth daily. 07/23/21   Shelda Pal, DO  pantoprazole (PROTONIX) 40 MG tablet Take 1 tablet (40 mg total) by mouth 2 (two) times daily before a meal. 06/12/20   Wendling, Crosby Oyster, DO  Risankizumab-rzaa (SKYRIZI) 150 MG/ML SOSY Inject 150 mg into the skin every 30 (thirty) days. 11/24/21   Shelda Pal, DO  triamcinolone (KENALOG) 0.025 % cream Apply topically to the affected area twice daily 11/24/21   Nani Ravens, Crosby Oyster, DO  valACYclovir (VALTREX) 500 MG tablet Take 1 tablet (500 mg total) by mouth daily. 10/18/21   Shelda Pal, DO      Allergies    Cat hair extract, Dog epithelium, and Dust mite extract    Review of Systems   Review of Systems  Constitutional:  Negative for fever.  Skin:  Negative for wound.  Neurological:  Negative for weakness and numbness.    Physical Exam Updated Vital Signs BP 105/80   Pulse 66   Temp 98.1 F (36.7 C) (Oral)   Resp 16   SpO2 99%  Physical Exam Constitutional:      Appearance: Normal appearance. She is well-developed.  HENT:     Head: Normocephalic and atraumatic.  Eyes:     Conjunctiva/sclera: Conjunctivae normal.  Musculoskeletal:        General: Tenderness present. Normal range of motion.  Cervical back: Neck supple.     Comments: Left upper extremity nontender shoulder elbow and hand.  She has some moderate tenderness of her left wrist primarily over the radial aspect.  There is a little bit of swelling.  No open wounds or erythema.  Pain with range of motion at wrist.  Skin:    General: Skin is warm and dry.  Neurological:     General: No focal deficit present.     Mental Status: She is alert.     GCS: GCS eye subscore is 4. GCS verbal subscore is 5. GCS motor subscore is 6.     Sensory: No sensory deficit.     Motor: No weakness.     ED Results / Procedures / Treatments   Labs (all labs ordered are listed, but only abnormal  results are displayed) Labs Reviewed - No data to display  EKG None  Radiology DG Wrist Complete Left  Result Date: 02/19/2022 CLINICAL DATA:  46 year old female with history of pain and swelling in the medial aspect of the left wrist. EXAM: LEFT WRIST - COMPLETE 3+ VIEW COMPARISON:  No priors. FINDINGS: There is no evidence of fracture or dislocation. There is no evidence of arthropathy or other focal bone abnormality. Soft tissues are unremarkable. IMPRESSION: Negative. Electronically Signed   By: Vinnie Langton M.D.   On: 02/19/2022 09:36    Procedures Procedures    Medications Ordered in ED Medications - No data to display  ED Course/ Medical Decision Making/ A&P Clinical Course as of 02/19/22 1816  Sat Feb 19, 2022  0858 X-rays ordered and interpreted by me as no acute fracture or dislocation.  Awaiting radiology reading. [MB]    Clinical Course User Index [MB] Hayden Rasmussen, MD                           Medical Decision Making Amount and/or Complexity of Data Reviewed Radiology: ordered.   46 year old right-hand-dominant female here with left wrist pain x2 days.  No known trauma.  Works doing physical labor.  X-rays ordered interpreted by me as no acute fracture or dislocation.  Patient placed in splint and recommended follow-up with PCP.  NSAIDs ice other symptomatic treatment.  Differential includes contusion, sprain, fracture, dislocation.        Final Clinical Impression(s) / ED Diagnoses Final diagnoses:  Sprain of left wrist, initial encounter    Rx / DC Orders ED Discharge Orders     None         Hayden Rasmussen, MD 02/19/22 1816

## 2022-02-19 NOTE — ED Triage Notes (Signed)
Pt reports pain and swelling over L medial wrist since this morning. No known injury. Had injections for thumb issues in this area years ago.

## 2022-03-16 ENCOUNTER — Telehealth (INDEPENDENT_AMBULATORY_CARE_PROVIDER_SITE_OTHER): Payer: BC Managed Care – PPO | Admitting: Family Medicine

## 2022-03-16 ENCOUNTER — Encounter: Payer: Self-pay | Admitting: Family Medicine

## 2022-03-16 DIAGNOSIS — N760 Acute vaginitis: Secondary | ICD-10-CM

## 2022-03-16 MED ORDER — CLOTRIMAZOLE 2 % VA CREA: 1.0000 | TOPICAL_CREAM | Freq: Every day | VAGINAL | 0 refills | Status: AC

## 2022-03-16 MED ORDER — FLUCONAZOLE 150 MG PO TABS: ORAL_TABLET | ORAL | 0 refills | Status: DC

## 2022-03-16 NOTE — Progress Notes (Signed)
Chief Complaint  Patient presents with   Vaginitis    Subjective: Patient is a 46 y.o. female here for vaginal issue. Due to COVID-19 pandemic, we are interacting via web portal for an electronic face-to-face visit. I verified patient's ID using 2 identifiers. Patient agreed to proceed with visit via this method. Patient is at home, I am at office. Patient and I are present for visit.   No fevers, urinary complaints, pain, itching, skin changes, fevers. She had unprotected intercourse with a man she has been seeing for a couple months. No concerns from his end. She does have a hx of yeast infections, does well w Diflucan but it causes her to have frequent urination.   Past Medical History:  Diagnosis Date   Genital warts    GERD (gastroesophageal reflux disease)    Psoriasis     Objective: No conversational dyspnea Age appropriate judgment and insight Nml affect and mood  Assessment and Plan: Acute vaginitis - Plan: clotrimazole (GYNE-LOTRIMIN 3) 2 % vaginal cream, fluconazole (DIFLUCAN) 150 MG tablet  Will try cream first. Diflucan if that isn't affordable or working. In 2 d, if no improvement will let me know and we will have her come in to self swab to ck for UTI, GC/chlamydia, trich, yeast and BV. The patient voiced understanding and agreement to the plan.  [place], DO 03/16/22  10:29 AM

## 2022-03-28 ENCOUNTER — Telehealth: Payer: Self-pay | Admitting: Family Medicine

## 2022-03-28 DIAGNOSIS — B3731 Acute candidiasis of vulva and vagina: Secondary | ICD-10-CM

## 2022-03-28 DIAGNOSIS — N760 Acute vaginitis: Secondary | ICD-10-CM

## 2022-03-28 NOTE — Telephone Encounter (Signed)
Can she come in to self swab? Would want BV, trich, candida, gc/chlamydia. Ty.

## 2022-03-28 NOTE — Telephone Encounter (Signed)
Spoke to the patient and the first available is Friday. She does not want to wait until Friday. Is there anything for tomorrow/or Wednesday? Call back number is [phone]

## 2022-03-28 NOTE — Telephone Encounter (Signed)
Pt is asking if it has to be an OV or can she just come in to do the swab.   Current phone number is not working due to phone being broken and these days. Call back at [phone]  Pt is asking if there is anything else that can be called in in the mean time.

## 2022-03-28 NOTE — Telephone Encounter (Signed)
Pt called stating meds are not working for the vaginal issue that she was seen for back on 8.9.23. Pt was unsure if she needed another appt or if Dr. Nani Ravens wanted to to try a different course of treatment.

## 2022-03-29 ENCOUNTER — Other Ambulatory Visit: Payer: BC Managed Care – PPO

## 2022-03-29 ENCOUNTER — Other Ambulatory Visit (HOSPITAL_COMMUNITY)
Admission: RE | Admit: 2022-03-29 | Discharge: 2022-03-29 | Disposition: A | Discharge status: Home / Self Care | Payer: BC Managed Care – PPO | Source: Ambulatory Visit | Attending: Family Medicine | Admitting: Family Medicine

## 2022-03-29 DIAGNOSIS — B3731 Acute candidiasis of vulva and vagina: Secondary | ICD-10-CM | POA: Insufficient documentation

## 2022-03-29 DIAGNOSIS — N760 Acute vaginitis: Secondary | ICD-10-CM

## 2022-03-29 NOTE — Telephone Encounter (Signed)
Scheduled lab appt with the patient///put in order for swab

## 2022-03-29 NOTE — Addendum Note (Signed)
Addended by: Sharon Seller B on: 03/29/2022 09:17 AM   Modules accepted: Orders

## 2022-03-29 NOTE — Addendum Note (Signed)
Addended by: Ronny Flurry on: 03/29/2022 02:38 PM   Modules accepted: Orders

## 2022-03-31 ENCOUNTER — Other Ambulatory Visit: Payer: Self-pay | Admitting: Family Medicine

## 2022-03-31 DIAGNOSIS — N760 Acute vaginitis: Secondary | ICD-10-CM

## 2022-03-31 LAB — CERVICOVAGINAL ANCILLARY ONLY
Bacterial Vaginitis (gardnerella): NEGATIVE
Candida Glabrata: NEGATIVE
Candida Vaginitis: POSITIVE — AB
Chlamydia: NEGATIVE
Comment: NEGATIVE
Comment: NEGATIVE
Comment: NEGATIVE
Comment: NEGATIVE
Comment: NEGATIVE
Comment: NORMAL
Neisseria Gonorrhea: NEGATIVE
Trichomonas: NEGATIVE

## 2022-03-31 MED ORDER — FLUCONAZOLE 150 MG PO TABS: ORAL_TABLET | ORAL | 0 refills | Status: DC

## 2022-05-03 ENCOUNTER — Telehealth: Payer: Self-pay | Admitting: Family Medicine

## 2022-05-03 NOTE — Telephone Encounter (Signed)
Patient states she needs a copy of the last TB test she got done here. She would like a call back when it's ready to be picked up.

## 2022-05-03 NOTE — Telephone Encounter (Signed)
Called the patient informed copy at the front desk (Had to leave a msg on cell/and home number)

## 2022-05-23 DIAGNOSIS — Z79899 Other long term (current) drug therapy: Secondary | ICD-10-CM | POA: Diagnosis not present

## 2022-05-23 DIAGNOSIS — L408 Other psoriasis: Secondary | ICD-10-CM | POA: Diagnosis not present

## 2022-06-07 ENCOUNTER — Other Ambulatory Visit: Payer: Self-pay | Admitting: Family Medicine

## 2022-06-07 ENCOUNTER — Telehealth: Payer: Self-pay | Admitting: Family Medicine

## 2022-06-07 DIAGNOSIS — R21 Rash and other nonspecific skin eruption: Secondary | ICD-10-CM

## 2022-06-07 NOTE — Telephone Encounter (Signed)
Sent in today 

## 2022-06-07 NOTE — Telephone Encounter (Signed)
Medication: triamcinolone (KENALOG) 0.025 % cream   Has the patient contacted their pharmacy? Yes.    Preferred Pharmacy (with phone number or street name): [facility] [place], [address]  [place], [place] [address]   Agent: Please be advised that RX refills may take up to 3 business days. We ask that you follow-up with your pharmacy.

## 2022-06-27 ENCOUNTER — Encounter: Payer: Self-pay | Admitting: Family Medicine

## 2022-06-27 ENCOUNTER — Ambulatory Visit (INDEPENDENT_AMBULATORY_CARE_PROVIDER_SITE_OTHER): Payer: BC Managed Care – PPO | Admitting: Family Medicine

## 2022-06-27 VITALS — BP 108/70 | HR 99 | Temp 98.9°F | Ht 66.0 in | Wt 185.0 lb

## 2022-06-27 DIAGNOSIS — R221 Localized swelling, mass and lump, neck: Secondary | ICD-10-CM

## 2022-06-27 DIAGNOSIS — K529 Noninfective gastroenteritis and colitis, unspecified: Secondary | ICD-10-CM | POA: Diagnosis not present

## 2022-06-27 DIAGNOSIS — K219 Gastro-esophageal reflux disease without esophagitis: Secondary | ICD-10-CM

## 2022-06-27 MED ORDER — ONDANSETRON 4 MG PO TBDP: 4.0000 mg | ORAL_TABLET | Freq: Three times a day (TID) | ORAL | 0 refills | Status: DC | PRN

## 2022-06-27 MED ORDER — FLUCONAZOLE 150 MG PO TABS: ORAL_TABLET | ORAL | 0 refills | Status: DC

## 2022-06-27 MED ORDER — METHYLPREDNISOLONE ACETATE 80 MG/ML IJ SUSP
80.0000 mg | Freq: Once | INTRAMUSCULAR | Status: AC
  Administered 2022-06-27: 80 mg via INTRAMUSCULAR

## 2022-06-27 MED ORDER — PANTOPRAZOLE SODIUM 40 MG PO TBEC: 40.0000 mg | DELAYED_RELEASE_TABLET | Freq: Two times a day (BID) | ORAL | 6 refills | Status: DC

## 2022-06-27 NOTE — Progress Notes (Signed)
Chief Complaint  Patient presents with   Hoarse    Hard to keep fluid down Knot in throat Fatigue      Subjective April Ho is a 46 y.o. female who presents with vomiting and abdominal pain Symptoms began 1 week.  Patient has central abdominal pain, lump in front of neck, cramping, vomiting, chills, and nausea Patient denies fever, headache, arthralgias, myalgias, and URI symptoms Treatment to date: none Sick contacts: contacts w/ similar symptoms at work The lump in front of her neck is lower and in the middle.  There is no redness or bruising over the area.  It is painful.  Nothing is draining from the area.  She notices it most when she swallows.  It is not obstructing anything when she swallows.  In  Past Medical History:  Diagnosis Date   Genital warts    GERD (gastroesophageal reflux disease)    Psoriasis     Exam BP 108/70 (BP Location: Left Arm, Patient Position: Sitting, Cuff Size: Normal)   Pulse 99   Temp 98.9 F (37.2 C) (Oral)   Ht '5\' 6"'$  (1.676 m)   Wt 185 lb (83.9 kg)   SpO2 96%   BMI 29.86 kg/m  General:  well developed, well hydrated, in no apparent distress Neck: Nodular enlargement over the thyroid appreciated grossly, mildly tender rubbery nodule over the thyroid, feels movable Skin:  warm, no pallor or diaphoresis, no rashes Throat/Pharynx:  lips and gingiva without lesion; tongue and uvula midline; non-inflamed pharynx; no exudates or postnasal drainage Lungs:  clear to auscultation, breath sounds equal bilaterally, no respiratory distress, no wheezes Cardio:  RRR Abdomen:  abdomen soft, nontender; bowel sounds normal; no masses or organomegaly Psych: Appropriate judgement/insight  Assessment and Plan  Neck mass - Plan: US THYROID, methylPREDNISolone acetate (DEPO-MEDROL) injection 80 mg  Gastroenteritis - Plan: ondansetron (ZOFRAN-ODT) 4 MG disintegrating tablet, methylPREDNISolone acetate (DEPO-MEDROL) injection 80 mg  Gastroesophageal  reflux disease, unspecified whether esophagitis present - Plan: pantoprazole (PROTONIX) 40 MG tablet  Check an ultrasound.  She is not having any hyper/hypothyroid symptoms.   Avoid aggravating foods, discussed advancing diet.  Zofran as needed. F/u if symptoms fail to improve, sooner if worsening. The patient voiced understanding and agreement to the plan.  [place], DO 06/27/22  2:54 PM

## 2022-06-27 NOTE — Patient Instructions (Addendum)
As long as you are vomiting or having diarrhea, please drink fluids with electrolytes like Gatorade, Powerade, or Pedialyte (if you can stomach the taste). Drink these along with water mainly.   Call Center for [place] at [facility] at [phone] for an appointment.  They are located at [address], [address], [place], [place], [address] (right across the hall from our office).  We will be in touch regarding your [place] results.   Let us know if you need anything.

## 2022-06-29 ENCOUNTER — Ambulatory Visit (HOSPITAL_BASED_OUTPATIENT_CLINIC_OR_DEPARTMENT_OTHER)
Admission: RE | Admit: 2022-06-29 | Discharge: 2022-06-29 | Disposition: A | Discharge status: Home / Self Care | Payer: BC Managed Care – PPO | Source: Ambulatory Visit | Attending: Family Medicine | Admitting: Family Medicine

## 2022-06-29 ENCOUNTER — Telehealth: Payer: Self-pay | Admitting: Family Medicine

## 2022-06-29 DIAGNOSIS — E041 Nontoxic single thyroid nodule: Secondary | ICD-10-CM | POA: Diagnosis not present

## 2022-06-29 DIAGNOSIS — R221 Localized swelling, mass and lump, neck: Secondary | ICD-10-CM | POA: Insufficient documentation

## 2022-06-29 MED ORDER — POLYETHYLENE GLYCOL 3350 17 G PO PACK: 17.0000 g | PACK | Freq: Every day | ORAL | 0 refills | Status: AC

## 2022-06-29 NOTE — Telephone Encounter (Signed)
Patient called to advise she needs something called in for constipation. Patient would prefer something her insurance would pay for as she doesn't have a lot of money to buy OTC medication. Please call and advise what patient can use to relieve constipation.

## 2022-06-29 NOTE — Telephone Encounter (Signed)
Not sure what her ins will pay for but will try. Ty.

## 2022-07-15 ENCOUNTER — Telehealth: Payer: Self-pay | Admitting: Family Medicine

## 2022-07-15 NOTE — Telephone Encounter (Signed)
Called the patient left message to call back. Patient scheduled with PCP on 07/18/22.

## 2022-07-15 NOTE — Telephone Encounter (Signed)
Pt called to get an appointment for some issues she had been having. Pt stated that she was having some issues with swallowing and a cold that wasn't wanting to go away. She then stated "I feel like hurting myself, but I can't". Attempted to confirm what she said and she kind of brushed it off. Warm transferred to patient coordinator to wait for triage nurse eval.

## 2022-07-15 NOTE — Telephone Encounter (Signed)
Nurse Assessment Nurse: Kirk Ruths, RN, [facility] Date/Time April Ho Time): 07/15/2022 10:55:16 AM Confirm and document reason for call. If symptomatic, describe symptoms. ---Caller states everything she swallows burns on the way down denies suicidal thought stating she is in so much pain from her throat and it has been going on for a month has seen md and was told to cvall back if continued she denies suicidal Does the patient have any new or worsening symptoms? ---Yes Will a triage be completed? ---Yes Related visit to physician within the last 2 weeks? ---No Does the PT have any chronic conditions? (i.e. diabetes, asthma, this includes High risk factors for pregnancy, etc.) ---Unknown Is the patient pregnant or possibly pregnant? (Ask all females between the ages of 32-55) ---No Is this a behavioral health or substance abuse call? ---No PLEASE NOTE: All timestamps contained within this report are represented as [place] Standard Time. CONFIDENTIALTY NOTICE: This fax transmission is intended only for the addressee. It contains information that is legally privileged, confidential or otherwise protected from use or disclosure. If you are not the intended recipient, you are strictly prohibited from reviewing, disclosing, copying using or disseminating any of this information or taking any action in reliance on or regarding this information. If you have received this fax in error, please notify us immediately by telephone so that we can arrange for its return to [place]. Phone: [phone], Toll-Free: [phone], Fax: [phone] Page: 2 of 3 Call Id: [id number] Guidelines Guideline Title Affirmed Question Affirmed Notes Nurse Date/Time April Ho Time) Suicide Concerns Patient sounds very sick or weak to the triager Kirk Ruths, RN, [facility] 07/15/2022 10:57:43 AM Sore Throat SEVERE (e.g., excruciating) throat pain Kirk Ruths, RN, [facility] 07/15/2022 11:00:18 AM Disp. Time April Ho Time) Disposition Final  User 07/15/2022 10:52:36 AM Send to Urgent Queue Jaynie Crumble 07/15/2022 10:59:51 AM Go to ED Now (or PCP triage) Kirk Ruths, RN, [facility] 07/15/2022 11:01:19 AM See PCP within 24 Hours Yes Kirk Ruths, RN, [facility] Final Disposition 07/15/2022 11:01:19 AM See PCP within 24 Hours Yes Kirk Ruths, RN, [facility] Caller Disagree/Comply Comply Caller Understands Yes PreDisposition Call Doctor Care Advice Given Per Guideline GO TO ED NOW (OR PCP TRIAGE): * IF NO PCP (PRIMARY CARE PROVIDER) SECOND-LEVEL TRIAGE: You need to be seen within the next hour. Go to the [facility] at _____________ [place] as soon as you can. CARE ADVICE given per Suicide Concerns (Adult) guideline. SEE PCP WITHIN 24 HOURS: * IF OFFICE WILL BE OPEN: You need to be examined within the next 24 hours. Call your doctor (or NP/PA) when the office opens and make an appointment. SORE THROAT: * Here are some simple things you can do to treat and reduce sore throat pain. * Sip warm chicken broth or apple juice. * Suck on hard candy or an over-the-counter throat lozenge. * Gargle with warm salt water four times a day. To make salt water, put 1/2 teaspoon of salt in 8 oz (240 ml) of warm water. * Avoid cigarette smoke. PAIN AND FEVER MEDICINES: * For pain or fever relief, take either acetaminophen or ibuprofen. * They are over-the-counter (OTC) drugs that help treat both fever and pain. You can buy them at the drugstore. * Treat fevers above 101 F (38.3 C). The goal of fever therapy is to bring the fever down to a comfortable level. Remember that fever medicine usually lowers fever 2 degrees F (1 - 1 1/2 degrees C). * ACETAMINOPHEN - REGULAR STRENGTH TYLENOL: Take 650 mg (two 325 mg pills) by mouth every 4 to 6  hours as needed. Each Regular Strength Tylenol pill has 325 mg of acetaminophen. The most you should take is 10 pills a day (3,250 mg total). Note: In [place], the maximum is 12 pills a day (3,900 mg total). SOFT DIET: * Eat a soft diet. * Cold  drinks, popsicles, and milk shakes are especially good. Avoid citrus fruits. DRINK PLENTY OF LIQUIDS: * Drink plenty of liquids. It is important to stay well-hydrated. * A healthy adult should drink 8 cups or more of liquid each day. One cup equals 8 oz (240 ml). * How can you tell if you are drinking enough liquids? The goal is to keep the urine clear or light-yellow in color. If your urine is bright yellow or dark yellow, you are probably not drinking enough liquids. CALL BACK IF: * You become worse CARE ADVICE given per Sore Throat (Adult) guideline. PLEASE NOTE: All timestamps contained within this report are represented as [place] Standard Time. CONFIDENTIALTY NOTICE: This fax transmission is intended only for the addressee. It contains information that is legally privileged, confidential or otherwise protected from use or disclosure. If you are not the intended recipient, you are strictly prohibited from reviewing, disclosing, copying using or disseminating any of this information or taking any action in reliance on or regarding this information. If you have received this fax in error, please notify us immediately by telephone so that we can arrange for its return to [place]. Phone: [phone], Toll-Free: [phone], Fax: [phone] Page: 3 of 3 Call Id: [id number] Comments User: Tawni Levy, RN Date/Time April Ho Time): 07/15/2022 11:06:19 AM patient declined both triage outcomes Dr Nani Ravens MA will call her back Referrals GO TO FACILITY UNDECIDED REFERRED TO PCP OFFICE

## 2022-07-15 NOTE — Telephone Encounter (Signed)
Called left message to call back 

## 2022-07-15 NOTE — Telephone Encounter (Signed)
Spoke w/ RN from triage- Pt with suicidal ideations- mentions she just wants to kill herself because her throat hurts her so much. Disposition is to go to ER for immediate eval for these thoughts but Pt refuses, RN called over to see if we have appts today- which we do not. Pt hung up on triage RN while we were on the phone together. Informed that I'd make PCP and CMA aware for them to call back.

## 2022-07-18 ENCOUNTER — Encounter: Payer: Self-pay | Admitting: Family Medicine

## 2022-07-18 ENCOUNTER — Other Ambulatory Visit (INDEPENDENT_AMBULATORY_CARE_PROVIDER_SITE_OTHER): Payer: BC Managed Care – PPO

## 2022-07-18 ENCOUNTER — Other Ambulatory Visit: Payer: Self-pay | Admitting: Family Medicine

## 2022-07-18 ENCOUNTER — Ambulatory Visit (INDEPENDENT_AMBULATORY_CARE_PROVIDER_SITE_OTHER): Payer: BC Managed Care – PPO | Admitting: Family Medicine

## 2022-07-18 VITALS — BP 120/70 | HR 78 | Temp 98.4°F | Ht 66.0 in | Wt 183.4 lb

## 2022-07-18 DIAGNOSIS — J208 Acute bronchitis due to other specified organisms: Secondary | ICD-10-CM

## 2022-07-18 DIAGNOSIS — R5383 Other fatigue: Secondary | ICD-10-CM

## 2022-07-18 DIAGNOSIS — B9689 Other specified bacterial agents as the cause of diseases classified elsewhere: Secondary | ICD-10-CM | POA: Diagnosis not present

## 2022-07-18 LAB — CBC
HCT: 35.5 % — ABNORMAL LOW (ref 36.0–46.0)
Hemoglobin: 12.1 g/dL (ref 12.0–15.0)
MCHC: 34.2 g/dL (ref 30.0–36.0)
MCV: 124.4 fl — ABNORMAL HIGH (ref 78.0–100.0)
Platelets: 208 10*3/uL (ref 150.0–400.0)
RBC: 2.85 Mil/uL — ABNORMAL LOW (ref 3.87–5.11)
RDW: 15.1 % (ref 11.5–15.5)
WBC: 2.6 10*3/uL — ABNORMAL LOW (ref 4.0–10.5)

## 2022-07-18 LAB — TSH: TSH: 0.18 u[IU]/mL — ABNORMAL LOW (ref 0.35–5.50)

## 2022-07-18 LAB — IBC + FERRITIN
Ferritin: 115.3 ng/mL (ref 10.0–291.0)
Iron: 91 ug/dL (ref 42–145)
Saturation Ratios: 29.4 % (ref 20.0–50.0)
TIBC: 309.4 ug/dL (ref 250.0–450.0)
Transferrin: 221 mg/dL (ref 212.0–360.0)

## 2022-07-18 LAB — B12 AND FOLATE PANEL
Folate: 11.7 ng/mL (ref >5.9)
Vitamin B-12: <50 pg/mL

## 2022-07-18 LAB — T4, FREE: Free T4: 0.75 ng/dL (ref 0.60–1.60)

## 2022-07-18 MED ORDER — FLUCONAZOLE 150 MG PO TABS: ORAL_TABLET | ORAL | 0 refills | Status: DC

## 2022-07-18 MED ORDER — AZITHROMYCIN 250 MG PO TABS: ORAL_TABLET | ORAL | 0 refills | Status: DC

## 2022-07-18 MED ORDER — MELOXICAM 15 MG PO TABS: 15.0000 mg | ORAL_TABLET | Freq: Every day | ORAL | 0 refills | Status: DC

## 2022-07-18 MED ORDER — ALBUTEROL SULFATE HFA 108 (90 BASE) MCG/ACT IN AERS: 2.0000 | INHALATION_SPRAY | Freq: Four times a day (QID) | RESPIRATORY_TRACT | 0 refills | Status: DC | PRN

## 2022-07-18 NOTE — Patient Instructions (Addendum)
Consider Mucinex to help with the chest congestion.   Continue to push fluids, practice good hand hygiene, and cover your mouth if you cough.  If you start having fevers, shaking or shortness of breath, seek immediate care.  Give [place] 2-3 business days to get the results of your labs back.   Let us know if you need anything.

## 2022-07-18 NOTE — Progress Notes (Signed)
Chief Complaint  Patient presents with   Insomnia    Congestion in throat and chest     April Ho here for URI complaints.  Duration: 4 weeks  Associated symptoms: rhinorrhea, sore throat, chest tightness, and coughing Denies: sinus pain, itchy watery eyes, ear pain, ear drainage, wheezing, shortness of breath, myalgia, and fevers Treatment to date: Zofran, Protonix, Tylenol cold/sinus Sick contacts: No  Past Medical History:  Diagnosis Date   Genital warts    GERD (gastroesophageal reflux disease)    Psoriasis     Objective BP 120/70 (BP Location: Left Arm, Patient Position: Sitting, Cuff Size: Normal)   Pulse 78   Temp 98.4 F (36.9 C) (Oral)   Ht '5\' 6"'$  (1.676 m)   Wt 183 lb 6 oz (83.2 kg)   SpO2 98%   BMI 29.60 kg/m  General: Awake, alert, appears stated age HEENT: AT, April Ho, ears patent b/l and TM's neg, nares patent w/o discharge, pharynx pink and without exudates, MMM Neck: No masses or asymmetry Heart: RRR Lungs: CTAB, no accessory muscle use Psych: Age appropriate judgment and insight, normal mood and affect  Acute bacterial bronchitis - Plan: fluconazole (DIFLUCAN) 150 MG tablet, azithromycin (ZITHROMAX) 250 MG tablet, meloxicam (MOBIC) 15 MG tablet  Other fatigue - Plan: CBC, TSH, IBC + Ferritin  Continue to push fluids, practice good hand hygiene, cover mouth when coughing. F/u prn. If starting to experience fevers, shaking, or shortness of breath, seek immediate care. Having some fatigue, hx of Fe def. Will ck labs.  Pt voiced understanding and agreement to the plan.  [place], DO 07/18/22 8:57 AM

## 2022-07-18 NOTE — Telephone Encounter (Signed)
Patient in the office today 07/18/22

## 2022-07-19 ENCOUNTER — Telehealth: Payer: Self-pay | Admitting: *Deleted

## 2022-07-19 NOTE — Telephone Encounter (Signed)
-----   Message from April Pal, DO sent at 07/19/2022  7:33 AM EST ----- Please let pt know labs are normal/stable. MCV is a little high, has she been drinking alcohol? Otherwise, no changes. Ty.

## 2022-07-19 NOTE — Telephone Encounter (Signed)
Spoke with patient stated that she has not been drinking any alcohol but has been taking a lot of cold medication.

## 2022-07-26 ENCOUNTER — Telehealth: Payer: Self-pay | Admitting: Family Medicine

## 2022-07-27 ENCOUNTER — Other Ambulatory Visit: Payer: Self-pay | Admitting: Family Medicine

## 2022-07-27 NOTE — Telephone Encounter (Signed)
Pt called stating that she was following up on her Rx denial. She stated that she was aware she needed an appt but thought it was "stupid" to come in for an appt just to have her medication refilled and wanted to speak to [place]. Shirlean Mylar was unavailable at the time and advised a note would be sent back to look into this and give her a call later. Pt acknowledged understanding.

## 2022-07-27 NOTE — Telephone Encounter (Signed)
Called both cell/home number left a detailed message to call back with reason doxy is needed per PCP request

## 2022-07-27 NOTE — Telephone Encounter (Signed)
Called the patient left detailed message to call back. PCP would like to know what she needs medication for.

## 2022-07-29 MED ORDER — FLUCONAZOLE 150 MG PO TABS: ORAL_TABLET | ORAL | 0 refills | Status: DC

## 2022-07-29 NOTE — Telephone Encounter (Signed)
Sent Diflucan to take 1 tab every 3 days for 3 doses. Ty.

## 2022-07-29 NOTE — Telephone Encounter (Signed)
Patient made aware of refill request done.

## 2022-07-29 NOTE — Telephone Encounter (Signed)
She is needing something for yeast infection--she needs more than #2 for this. States she is on a antibiotic

## 2022-07-29 NOTE — Addendum Note (Signed)
Addended by: Ames Coupe on: 07/29/2022 09:25 AM   Modules accepted: Orders

## 2022-08-10 ENCOUNTER — Telehealth: Payer: Self-pay | Admitting: Family Medicine

## 2022-08-10 MED ORDER — BENZONATATE 200 MG PO CAPS: 200.0000 mg | ORAL_CAPSULE | Freq: Two times a day (BID) | ORAL | 0 refills | Status: DC | PRN

## 2022-08-10 NOTE — Telephone Encounter (Signed)
Patient would like a call back to figure out the name of the medication she needs a refill on. She states it's for her cough and cannot find the prescription name. Please advise.

## 2022-08-10 NOTE — Telephone Encounter (Signed)
She is in need of a refill of a cough pill that was sent in and has helped with her cough.  Do not see on her current list.

## 2022-08-10 NOTE — Telephone Encounter (Signed)
Patient informed. 

## 2022-08-17 ENCOUNTER — Ambulatory Visit: Payer: BC Managed Care – PPO | Admitting: Family Medicine

## 2022-08-22 ENCOUNTER — Ambulatory Visit (INDEPENDENT_AMBULATORY_CARE_PROVIDER_SITE_OTHER): Payer: BC Managed Care – PPO | Admitting: Family Medicine

## 2022-08-22 ENCOUNTER — Encounter: Payer: Self-pay | Admitting: Family Medicine

## 2022-08-22 VITALS — BP 118/72 | HR 71 | Temp 97.9°F | Ht 66.0 in | Wt 181.0 lb

## 2022-08-22 DIAGNOSIS — J309 Allergic rhinitis, unspecified: Secondary | ICD-10-CM | POA: Diagnosis not present

## 2022-08-22 DIAGNOSIS — Z1211 Encounter for screening for malignant neoplasm of colon: Secondary | ICD-10-CM

## 2022-08-22 MED ORDER — FLUCONAZOLE 150 MG PO TABS: ORAL_TABLET | ORAL | 0 refills | Status: DC

## 2022-08-22 MED ORDER — FLUTICASONE PROPIONATE 50 MCG/ACT NA SUSP: 2.0000 | Freq: Every day | NASAL | 6 refills | Status: DC

## 2022-08-22 MED ORDER — LEVOCETIRIZINE DIHYDROCHLORIDE 5 MG PO TABS: ORAL_TABLET | ORAL | 2 refills | Status: AC

## 2022-08-22 NOTE — Patient Instructions (Signed)
Claritin (loratadine), Allegra (fexofenadine), Zyrtec (cetirizine) which is also equivalent to Xyzal (levocetirizine); these are listed in order from weakest to strongest. Generic, and therefore cheaper, options are in the parentheses.   Flonase (fluticasone); nasal spray that is over the counter. 2 sprays each nostril, once daily. Aim towards the same side eye when you spray.  There are available OTC, and the generic versions, which may be cheaper, are in parentheses. Show this to a pharmacist if you have trouble finding any of these items.  Send me a message in 2 weeks if we aren't doing better.   If you do not hear anything about your referral in the next 1-2 weeks, call our office and ask for an update.  Call Center for [place] at [facility] at [phone] for an appointment.  They are located at [address], [address], [place], [place], [address] (right across the hall from our office).  Let us know if you need anything.

## 2022-08-22 NOTE — Progress Notes (Signed)
Chief Complaint  Patient presents with   Follow-up    Nasal congestion     April Ho here for URI complaints.  Duration: 6 weeks  Associated symptoms: sinus congestion, rhinorrhea, sore throat, chest tightness, and coughing Denies: sinus pain, itchy watery eyes, ear pain, ear drainage, wheezing, shortness of breath, myalgia, and fevers Treatment to date: Zpak, Tessalon Perles, Vit C, Tylenol Sick contacts: No  Past Medical History:  Diagnosis Date   Genital warts    GERD (gastroesophageal reflux disease)    Psoriasis     Objective BP 118/72 (BP Location: Left Arm, Patient Position: Sitting, Cuff Size: Normal)   Pulse 71   Temp 97.9 F (36.6 C) (Oral)   Ht '5\' 6"'$  (1.676 m)   Wt 181 lb (82.1 kg)   SpO2 99%   BMI 29.21 kg/m  General: Awake, alert, appears stated age HEENT: AT, [place], ears patent b/l and TM's neg, nares patent w/o discharge, turbinates are swollen and pale bilaterally, pharynx pink and without exudates, MMM, no sinus TTP throughout Neck: No masses or asymmetry Heart: RRR Lungs: CTAB, no accessory muscle use Psych: Age appropriate judgment and insight, normal mood and affect  Allergic rhinitis, unspecified seasonality, unspecified trigger - Plan: levocetirizine (XYZAL) 5 MG tablet, fluticasone (FLONASE) 50 MCG/ACT nasal spray  Screen for colon cancer - Plan: Ambulatory referral to Gastroenterology  Restart Xyzal 5 mg daily, add Flonase 2 sprays each nostril daily.  She will send me a message in 2 weeks if no improvement and we will send in Singulair 10 mg daily.  F/u prn. If starting to experience fevers, shaking, or shortness of breath, seek immediate care. Pt voiced understanding and agreement to the plan.  [place], DO 08/22/22 3:04 PM

## 2022-09-09 ENCOUNTER — Other Ambulatory Visit: Payer: Self-pay

## 2022-09-09 ENCOUNTER — Emergency Department (HOSPITAL_BASED_OUTPATIENT_CLINIC_OR_DEPARTMENT_OTHER)
Admission: EM | Admit: 2022-09-09 | Discharge: 2022-09-09 | Disposition: A | Discharge status: Home / Self Care | Payer: PRIVATE HEALTH INSURANCE | Attending: Emergency Medicine | Admitting: Emergency Medicine

## 2022-09-09 ENCOUNTER — Encounter (HOSPITAL_BASED_OUTPATIENT_CLINIC_OR_DEPARTMENT_OTHER): Payer: Self-pay | Admitting: Emergency Medicine

## 2022-09-09 DIAGNOSIS — Y990 Civilian activity done for income or pay: Secondary | ICD-10-CM | POA: Diagnosis not present

## 2022-09-09 DIAGNOSIS — W1839XA Other fall on same level, initial encounter: Secondary | ICD-10-CM | POA: Insufficient documentation

## 2022-09-09 DIAGNOSIS — W19XXXA Unspecified fall, initial encounter: Secondary | ICD-10-CM

## 2022-09-09 DIAGNOSIS — M79651 Pain in right thigh: Secondary | ICD-10-CM | POA: Diagnosis present

## 2022-09-09 MED ORDER — NAPROXEN 500 MG PO TABS: 500.0000 mg | ORAL_TABLET | Freq: Two times a day (BID) | ORAL | 0 refills | Status: DC

## 2022-09-09 NOTE — Discharge Instructions (Addendum)
You were evaluated in the emergency department for pain of the right thigh following a fall.  Workup today was overall nonspecific for 1 cause, though it is very unlikely that you have sustained a fracture.  Please follow-up with orthopedics for further evaluation.  You have been provided the contact information, please call to schedule your follow-up appointment within the next 3 to 5 days.  Return to the ED for new or worsening symptoms as discussed.

## 2022-09-09 NOTE — ED Provider Notes (Cosign Needed Addendum)
[facility] Provider Note   CSN: [id number] Arrival date & time: 09/09/22  0847     History  Chief Complaint  Patient presents with   April Ho    April Ho is a 47 y.o. female with hx of psoriasis and GERD presenting with chief complaint of right thigh pain following a fall around 1:30 AM this morning.  Patient states she was moving around pallets using a lift-type machine, someone was standing on the pallets, and she leaned backwards and fell.  Landed on her bottom with cushion behind her.  Stated she needed assistance getting up.  Then began developing right thigh pain.  Worse with touch, feels "sore".  Denies saddle anesthesia, back pain, urinary/bowel incontinence, fevers, leg weakness, numbness or tingling of legs.  Denies difficulty walking, bending over, twisting back, or weightbearing.  Denies hitting head or losing consciousness.  No other complaints at this time.  The history is provided by the patient and medical records.  Fall      Home Medications Prior to Admission medications   Medication Sig Start Date End Date Taking? Authorizing Provider  naproxen (NAPROSYN) 500 MG tablet Take 1 tablet (500 mg total) by mouth 2 (two) times daily. 09/09/22  Yes Prince Rome, PA-C  benzonatate (TESSALON) 200 MG capsule Take 1 capsule (200 mg total) by mouth 2 (two) times daily as needed for cough. 08/10/22   Shelda Pal, DO  EPINEPHrine 0.3 mg/0.3 mL IJ SOAJ injection Inject 0.3 mLs (0.3 mg total) into the muscle as needed for anaphylaxis. 01/29/19   Shelda Pal, DO  fluconazole (DIFLUCAN) 150 MG tablet Take 1 tab every 72 hours for 3 doses. 08/22/22   Shelda Pal, DO  fluticasone (FLONASE) 50 MCG/ACT nasal spray Place 2 sprays into both nostrils daily. 08/22/22   Shelda Pal, DO  levocetirizine (XYZAL) 5 MG tablet TAKE 1 TABLET(5 MG) BY MOUTH EVERY EVENING 08/22/22   Wendling, Crosby Oyster, DO   pantoprazole (PROTONIX) 40 MG tablet Take 1 tablet (40 mg total) by mouth 2 (two) times daily before a meal. 06/27/22   Wendling, Crosby Oyster, DO  polyethylene glycol (MIRALAX) 17 g packet Take 17 g by mouth daily. 06/29/22   Wendling, Crosby Oyster, DO  Risankizumab-rzaa (SKYRIZI) 150 MG/ML SOSY Inject 150 mg into the skin every 30 (thirty) days. 11/24/21   Shelda Pal, DO  valACYclovir (VALTREX) 500 MG tablet Take 1 tablet (500 mg total) by mouth daily. 10/18/21   Shelda Pal, DO      Allergies    Cat hair extract, Dog epithelium, and Dust mite extract    Review of Systems   Review of Systems  Constitutional:        Fall    Physical Exam Updated Vital Signs BP 115/79   Pulse 74   Temp 98 F (36.7 C)   Resp 18   Ht '5\' 6"'$  (1.676 m)   Wt 82 kg   SpO2 100%   BMI 29.18 kg/m  Physical Exam Vitals and nursing note reviewed.  Constitutional:      General: She is not in acute distress.    Appearance: She is well-developed. She is not ill-appearing or diaphoretic.  HENT:     Head: Normocephalic and atraumatic.  Eyes:     Conjunctiva/sclera: Conjunctivae normal.  Cardiovascular:     Rate and Rhythm: Normal rate and regular rhythm.     Heart sounds: No murmur heard.  Comments: PT pulses 2+ bilat Pulmonary:     Effort: Pulmonary effort is normal. No respiratory distress.  Abdominal:     Palpations: Abdomen is soft.     Tenderness: There is no abdominal tenderness.  Musculoskeletal:        General: Tenderness (Mild, right thigh, soft tissue) present. No swelling.     Cervical back: Neck supple. No rigidity or tenderness.     Right lower leg: No edema.     Left lower leg: No edema.     Comments: Full ROM, strength, sensation, and coordination of lower extremities.  No bony deformity, leg shortening, malrotation, or bony tenderness.  No midline or paraspinal tenderness of C/T/L spine.  No SI joint tenderness.  Pelvis stable.    Skin:    General: Skin  is warm.     Capillary Refill: Capillary refill takes less than 2 seconds.  Neurological:     General: No focal deficit present.     Mental Status: She is alert.     Sensory: No sensory deficit.     Motor: No weakness.     Coordination: Coordination normal.     Gait: Gait normal.     Comments: No saddle anesthesia.  All extremities appear neurovascularly intact.  Psychiatric:        Mood and Affect: Mood normal.     ED Results / Procedures / Treatments   Labs (all labs ordered are listed, but only abnormal results are displayed) Labs Reviewed - No data to display  EKG None  Radiology No results found.  Procedures Procedures    Medications Ordered in ED Medications - No data to display  ED Course/ Medical Decision Making/ A&P                             Medical Decision Making Risk Prescription drug management.   47 y.o. female presents to the ED for concern of Fall   This involves an extensive number of treatment options, and is a complaint that carries with it a high risk of complications and morbidity.     Past Medical History / Co-morbidities / Social History: Hx of Psoriasis, GERD Social Determinants of Health include: None  Additional History:  None  Lab Tests: None  Imaging Studies: None  ED Course: Pt well-appearing on exam.  Presenting with acute right thigh pain following a fall around 1:30 AM this morning.  Did not hit head or lose consciousness. Full ROM, strength, sensation, coordination appreciated in lower extremities bilaterally.  No bony tenderness.  No midline or paraspinal back tenderness.  No evidence of head injury.  No saddle anesthesia.  No urinary or bowel incontinence.  Lower extremities appear grossly neurovascularly intact, as well as gait.  Doubt acute fracture or dislocation.  Considered x-ray imaging, however does not appear appropriate at this time given low likelihood of fracture and patient's overall well exam.   Likely  benign MSK etiology such as strain vs contusion vs spasm.  Pt advised to follow up with orthopedics if symptoms persist for possibility of missed fracture diagnosis, resources provided.  Conservative therapy recommended and discussed.  Naproxen sent to pharmacy.  Patient reports satisfaction with today's encounter.  Strict return precautions discussed.  Patient in NAD and in good condition at time of discharge.  Disposition: After consideration the patient's encounter today, I do not feel today's workup suggests an emergent condition requiring admission or immediate intervention beyond what has  been performed at this time.  Safe for discharge; instructed to return immediately for worsening symptoms, change in symptoms or any other concerns.  I have reviewed the patients home medicines and have made adjustments as needed.  Discussed course of treatment with the patient, whom demonstrated understanding.  Patient in agreement and has no further questions.    This chart was dictated using voice recognition software.  Despite best efforts to proofread, errors can occur which can change the documentation meaning.   Final Clinical Impression(s) / ED Diagnoses Final diagnoses:  Fall, initial encounter  Acute pain of right thigh    Rx / DC Orders ED Discharge Orders          Ordered    naproxen (NAPROSYN) 500 MG tablet  2 times daily        09/09/22 0947              Prince Rome, PA-C 40/97/35 3299    Prince Rome, PA-C 24/26/83 1859    Sherwood Gambler, MD 09/15/22 [phone]

## 2022-09-09 NOTE — ED Triage Notes (Signed)
Reports fall early this AM at work , fell backward , right leg thigh pain . Obvious discomfort while ambulating

## 2022-09-12 ENCOUNTER — Telehealth: Payer: Self-pay

## 2022-09-12 NOTE — Telephone Encounter (Signed)
Transition Care Management Unsuccessful Follow-up Telephone Call  Date of discharge and from where:  [facility] ER 09/09/2022  Attempts:  1st Attempt  Reason for unsuccessful TCM follow-up call:  Left voice message

## 2022-09-13 NOTE — Telephone Encounter (Signed)
Transition Care Management Unsuccessful Follow-up Telephone Call  Date of discharge and from where:  [facility] 09/09/2022  Attempts:  2nd Attempt  Reason for unsuccessful TCM follow-up call:  Left voice message

## 2022-09-14 NOTE — Telephone Encounter (Signed)
Transition Care Management Unsuccessful Follow-up Telephone Call  Date of discharge and from where:  [facility] ER 09/09/2022  Attempts:  3rd Attempt  Reason for unsuccessful TCM follow-up call:  Left voice message

## 2022-09-19 ENCOUNTER — Encounter: Payer: Self-pay | Admitting: Family Medicine

## 2022-09-19 ENCOUNTER — Ambulatory Visit: Payer: BC Managed Care – PPO | Admitting: Family Medicine

## 2022-09-19 VITALS — BP 112/72 | HR 79 | Temp 98.8°F | Ht 66.0 in | Wt 182.2 lb

## 2022-09-19 DIAGNOSIS — R1313 Dysphagia, pharyngeal phase: Secondary | ICD-10-CM

## 2022-09-19 DIAGNOSIS — R110 Nausea: Secondary | ICD-10-CM | POA: Diagnosis not present

## 2022-09-19 LAB — CBC
HCT: 35.6 % — ABNORMAL LOW (ref 36.0–46.0)
Hemoglobin: 12 g/dL (ref 12.0–15.0)
MCHC: 33.7 g/dL (ref 30.0–36.0)
MCV: 128.3 fl — ABNORMAL HIGH (ref 78.0–100.0)
Platelets: 279 10*3/uL (ref 150.0–400.0)
RBC: 2.77 Mil/uL — ABNORMAL LOW (ref 3.87–5.11)
RDW: 15.9 % — ABNORMAL HIGH (ref 11.5–15.5)
WBC: 2.8 10*3/uL — ABNORMAL LOW (ref 4.0–10.5)

## 2022-09-19 LAB — BASIC METABOLIC PANEL
BUN: 13 mg/dL (ref 6–23)
CO2: 27 mEq/L (ref 19–32)
Calcium: 9.7 mg/dL (ref 8.4–10.5)
Chloride: 105 mEq/L (ref 96–112)
Creatinine, Ser: 0.77 mg/dL (ref 0.40–1.20)
GFR: 93.37 mL/min (ref >60.00)
Glucose, Bld: 88 mg/dL (ref 70–99)
Potassium: 4.3 mEq/L (ref 3.5–5.1)
Sodium: 140 mEq/L (ref 135–145)

## 2022-09-19 LAB — POCT URINE PREGNANCY: Preg Test, Ur: NEGATIVE

## 2022-09-19 LAB — T4, FREE: Free T4: 0.81 ng/dL (ref 0.60–1.60)

## 2022-09-19 LAB — TSH: TSH: 0.19 u[IU]/mL — ABNORMAL LOW (ref 0.35–5.50)

## 2022-09-19 MED ORDER — ONDANSETRON 4 MG PO TBDP: 4.0000 mg | ORAL_TABLET | Freq: Three times a day (TID) | ORAL | 0 refills | Status: DC | PRN

## 2022-09-19 NOTE — Addendum Note (Signed)
Addended by: Kem Boroughs D on: 09/19/2022 01:01 PM   Modules accepted: Orders

## 2022-09-19 NOTE — Patient Instructions (Addendum)
As long as you are vomiting or having diarrhea, please drink fluids with electrolytes like Gatorade, Powerade, or Pedialyte (if you can stomach the taste). Drink these along with water mainly.   Please contact the GI team at: [phone]  If you do not hear anything about your referral in the next 1-2 weeks, call our office and ask for an update.  Give [place] 2-3 business days to get the results of your labs back.   Let us know if you need anything.

## 2022-09-19 NOTE — Progress Notes (Signed)
Chief Complaint  Patient presents with   feels like something is stuck in her throat    Nausea Unable to keep food down     Subjective: Patient is a 47 y.o. female here for trouble swallowing.  A little over a week ago, the patient started having fullness in her neck region and difficulty swallowing.  She will sometimes regurgitate food back up.  No trauma to the neck.  No changes in food.  She has associated nausea.  Due to this, she has not been eating/drinking normally.  She takes Protonix for reflux.  In 1 point, she stood up and saw stars.  She nearly lost consciousness.  No palpitations.  Past Medical History:  Diagnosis Date   Genital warts    GERD (gastroesophageal reflux disease)    Psoriasis     Objective: BP 112/72 (BP Location: Left Arm, Patient Position: Sitting, Cuff Size: Normal)   Pulse 79   Temp 98.8 F (37.1 C) (Oral)   Ht 5' 6"$  (1.676 m)   Wt 182 lb 4 oz (82.7 kg)   SpO2 97%   BMI 29.42 kg/m  General: Awake, appears stated age Heart: RRR, no LE edema Lungs: CTAB, no rales, wheezes or rhonchi. No accessory muscle use Abdomen: Bowel sounds present, soft, nontender, nondistended Neck: Edema noted in the lower central portion of the neck.  I do not appreciate a significant goiter or any nodules.  There is tenderness over the anterior lower neck and distal SCM musculature bilaterally. Psych: Age appropriate judgment and insight, normal affect and mood  Assessment and Plan: Nausea - Plan: ondansetron (ZOFRAN-ODT) 4 MG disintegrating tablet, Basic metabolic panel, CBC, Pregnancy, urine  Pharyngeal dysphagia - Plan: Ambulatory referral to ENT, TSH, T4, free  Zofran.  Check labs and pregnancy test. Refer to ENT as she feels things are getting stuck above the sternal notch.  I also given the information for the gastroenterology team as she is due for colonoscopy anyways.  Check thyroid studies.  Could consider ultrasound. The patient voiced understanding and  agreement to the plan.  [place], DO 09/19/22  12:05 PM

## 2022-10-01 ENCOUNTER — Encounter (HOSPITAL_BASED_OUTPATIENT_CLINIC_OR_DEPARTMENT_OTHER): Payer: Self-pay

## 2022-10-01 ENCOUNTER — Emergency Department (HOSPITAL_BASED_OUTPATIENT_CLINIC_OR_DEPARTMENT_OTHER)
Admission: EM | Admit: 2022-10-01 | Discharge: 2022-10-01 | Disposition: A | Discharge status: Home / Self Care | Payer: BC Managed Care – PPO | Attending: Emergency Medicine | Admitting: Emergency Medicine

## 2022-10-01 ENCOUNTER — Other Ambulatory Visit: Payer: Self-pay

## 2022-10-01 DIAGNOSIS — R21 Rash and other nonspecific skin eruption: Secondary | ICD-10-CM | POA: Diagnosis not present

## 2022-10-01 MED ORDER — CETAPHIL MOISTURIZING EX CREA: TOPICAL_CREAM | CUTANEOUS | 0 refills | Status: DC | PRN

## 2022-10-01 MED ORDER — ERYTHROMYCIN 5 MG/GM OP OINT
TOPICAL_OINTMENT | Freq: Four times a day (QID) | OPHTHALMIC | Status: DC
  Filled 2022-10-01: qty 3.5

## 2022-10-01 NOTE — Discharge Instructions (Signed)
Recommend using antibiotic ointment every 6 hours for the next 3 to 5 days.  Recommend applying Cetaphil eye cream.

## 2022-10-01 NOTE — ED Provider Notes (Signed)
[place] [facility] [place] Provider Note   CSN: [id number] Arrival date & time: 10/01/22  [id number]     History  Chief Complaint  Patient presents with   Eye Problem    April Ho is a 47 y.o. female.  Patient here with right ear dictated skin around both of her eyes.  She has a history of psoriasis.  Nothing makes it worse or better.  Denies any trauma.  No vision loss.  No fever or chills.  No headaches.  No vision loss.  The history is provided by the patient.       Home Medications Prior to Admission medications   Medication Sig Start Date End Date Taking? Authorizing Provider  Emollient (CETAPHIL) cream Apply topically as needed. 10/01/22  Yes Raquel Racey, DO  EPINEPHrine 0.3 mg/0.3 mL IJ SOAJ injection Inject 0.3 mLs (0.3 mg total) into the muscle as needed for anaphylaxis. 01/29/19   Shelda Pal, DO  fluticasone (FLONASE) 50 MCG/ACT nasal spray Place 2 sprays into both nostrils daily. 08/22/22   Shelda Pal, DO  levocetirizine (XYZAL) 5 MG tablet TAKE 1 TABLET(5 MG) BY MOUTH EVERY EVENING 08/22/22   Wendling, Crosby Oyster, DO  naproxen (NAPROSYN) 500 MG tablet Take 1 tablet (500 mg total) by mouth 2 (two) times daily. [id number]   Prince Rome, PA-C  ondansetron (ZOFRAN-ODT) 4 MG disintegrating tablet Take 1 tablet (4 mg total) by mouth every 8 (eight) hours as needed for nausea or vomiting. 09/19/22   Nani Ravens, Crosby Oyster, DO  pantoprazole (PROTONIX) 40 MG tablet Take 1 tablet (40 mg total) by mouth 2 (two) times daily before a meal. 06/27/22   Wendling, Crosby Oyster, DO  polyethylene glycol (MIRALAX) 17 g packet Take 17 g by mouth daily. 06/29/22   Wendling, Crosby Oyster, DO  Risankizumab-rzaa (SKYRIZI) 150 MG/ML SOSY Inject 150 mg into the skin every 30 (thirty) days. 11/24/21   Shelda Pal, DO  valACYclovir (VALTREX) 500 MG tablet Take 1 tablet (500 mg total) by mouth daily. 10/18/21   Shelda Pal, DO      Allergies    Cat hair extract, Dog epithelium, and Dust mite extract    Review of Systems   Review of Systems  Physical Exam Updated Vital Signs BP (!) 145/83 (BP Location: Right Arm)   Pulse 78   Temp 98.9 F (37.2 C) (Oral)   Resp 17   Ht '5\' 6"'$  (1.676 m)   Wt 81.6 kg   SpO2 100%   BMI 29.05 kg/m  Physical Exam Vitals and nursing note reviewed.  Constitutional:      General: She is not in acute distress.    Appearance: She is well-developed. She is not ill-appearing.  HENT:     Head: Normocephalic and atraumatic.     Mouth/Throat:     Mouth: Mucous membranes are moist.  Eyes:     Extraocular Movements: Extraocular movements intact.     Conjunctiva/sclera: Conjunctivae normal.     Pupils: Pupils are equal, round, and reactive to light.     Comments: Visual acuity unremarkable.  Normal range of motion of the eyes.  Patient has dry skin around both eyes.  Cardiovascular:     Rate and Rhythm: Normal rate and regular rhythm.     Pulses: Normal pulses.     Heart sounds: Normal heart sounds. No murmur heard. Pulmonary:     Effort: Pulmonary effort is normal. No respiratory distress.  Breath sounds: Normal breath sounds.  Abdominal:     Palpations: Abdomen is soft.     Tenderness: There is no abdominal tenderness.  Musculoskeletal:     Cervical back: Normal range of motion and neck supple.  Skin:    General: Skin is warm and dry.     Capillary Refill: Capillary refill takes less than 2 seconds.     Findings: Rash present.  Neurological:     General: No focal deficit present.     Mental Status: She is alert.     ED Results / Procedures / Treatments   Labs (all labs ordered are listed, but only abnormal results are displayed) Labs Reviewed - No data to display  EKG None  Radiology No results found.  Procedures Procedures    Medications Ordered in ED Medications  erythromycin ophthalmic ointment ( Both Eyes Given 10/01/22 [id number])     ED Course/ Medical Decision Making/ A&P                             Medical Decision Making Risk OTC drugs. Prescription drug management.   April Ho is here with rash around her eyes.  History of psoriasis.  Normal vitals.  No fever.  Overall appears that she has psoriasis type rash around both of her eyes.  Does not appear to be involving the conjunctiva.  Extraocular motions are intact.  Pupils are equal and reactive.  Overall her eye exam is unremarkable.  I will conservatively put her on erythromycin eye ointment to treat for may be conjunctivitis however I think this is mostly the skin surrounding her eyes.  Skin is irritated and dry and scaly.  Recommend Cetaphil cream.  Will have her follow-up with her primary care doctor or an eye doctor.  Understands return precautions.  Discharged in good condition.  This chart was dictated using voice recognition software.  Despite best efforts to proofread,  errors can occur which can change the documentation meaning.         Final Clinical Impression(s) / ED Diagnoses Final diagnoses:  Rash of face    Rx / DC Orders ED Discharge Orders          Ordered    Emollient (CETAPHIL) cream  As needed        10/01/22 0823              Lennice Sites, DO 10/01/22 [id number]

## 2022-10-01 NOTE — ED Triage Notes (Signed)
"  Both eyes are itching, irritated and have crust when I wake up" per pt.  Denies difficulty with vision.

## 2022-10-05 ENCOUNTER — Other Ambulatory Visit: Payer: Self-pay | Admitting: Family Medicine

## 2022-10-05 DIAGNOSIS — R110 Nausea: Secondary | ICD-10-CM

## 2022-10-19 DIAGNOSIS — H9201 Otalgia, right ear: Secondary | ICD-10-CM | POA: Diagnosis not present

## 2022-10-19 DIAGNOSIS — R1314 Dysphagia, pharyngoesophageal phase: Secondary | ICD-10-CM | POA: Diagnosis not present

## 2022-10-19 DIAGNOSIS — E049 Nontoxic goiter, unspecified: Secondary | ICD-10-CM | POA: Diagnosis not present

## 2022-11-02 ENCOUNTER — Other Ambulatory Visit: Payer: Self-pay | Admitting: Family Medicine

## 2022-11-02 MED ORDER — VALACYCLOVIR HCL 500 MG PO TABS: 500.0000 mg | ORAL_TABLET | Freq: Every day | ORAL | 2 refills | Status: AC

## 2022-11-04 ENCOUNTER — Other Ambulatory Visit: Payer: Self-pay | Admitting: Family Medicine

## 2022-11-29 ENCOUNTER — Other Ambulatory Visit: Payer: Self-pay | Admitting: Family Medicine

## 2022-11-29 DIAGNOSIS — R110 Nausea: Secondary | ICD-10-CM

## 2022-12-07 ENCOUNTER — Encounter: Payer: Self-pay | Admitting: Family Medicine

## 2022-12-07 ENCOUNTER — Telehealth (INDEPENDENT_AMBULATORY_CARE_PROVIDER_SITE_OTHER): Payer: BC Managed Care – PPO | Admitting: Family Medicine

## 2022-12-07 ENCOUNTER — Ambulatory Visit (HOSPITAL_BASED_OUTPATIENT_CLINIC_OR_DEPARTMENT_OTHER)
Admission: RE | Admit: 2022-12-07 | Discharge: 2022-12-07 | Disposition: A | Discharge status: Home / Self Care | Payer: BC Managed Care – PPO | Source: Ambulatory Visit | Attending: Family Medicine | Admitting: Family Medicine

## 2022-12-07 ENCOUNTER — Telehealth: Payer: Self-pay | Admitting: Family Medicine

## 2022-12-07 VITALS — Ht 66.0 in | Wt 180.0 lb

## 2022-12-07 DIAGNOSIS — R195 Other fecal abnormalities: Secondary | ICD-10-CM | POA: Diagnosis not present

## 2022-12-07 DIAGNOSIS — R194 Change in bowel habit: Secondary | ICD-10-CM | POA: Diagnosis not present

## 2022-12-07 NOTE — Progress Notes (Signed)
Virtual Visit via Telephone Note  I connected with  Selmer Dominion on 12/07/22 at 11:40 AM EDT by telephone and verified that I am speaking with the correct person using two identifiers.   I discussed the limitations, risks, security and privacy concerns of performing an evaluation and management service by telephone and the availability of in person appointments. I also discussed with the patient that there may be a patient responsible charge related to this service. The patient expressed understanding and agreed to proceed.  Participating parties included in this telephone visit include: The patient and the nurse practitioner listed.  The patient is: At home I am: at [facility]   Subjective:    CC: constipation and diarrhea  HPI: April Ho is a 47 y.o. year old female presenting today via telephone visit to discuss constipation and diarrhea.  Patient reports she had about 2-3 weeks of constipation without any bowel movements. Then on Sunday, 3 days ago, she cooked some hamburger and reports she started having some bowel movements but they are a mix of hard and watery, only small amounts eat time. Reports that she has a BM about every 3-4 hours, always a mix of formed and watery. States she has been eating normal foods and regular amounts. She denies any blood or mucus in stool, abdominal pain, nausea, vomiting, incontinence, urinary changes, chills, body aches. States she did feel a little warm last night at work, but did not check her temperature. She denies any recent diet changes. She has taken some Pepto but didn't notice anything different.         Patient just woke uup - works 3rd shift, still going to work      Past medical history, Surgical history, Family history not pertinant except as noted below, Social history, Allergies, and medications have been entered into the medical record, reviewed, and corrections made.   Review of Systems:   All review of systems negative except what is listed in the HPI  Objective:    General:  Patient speaking clearly in complete sentences. - Sounds sleepy - states I woke her up (worked 3rd shift last night) No shortness of breath noted.   Alert and oriented x3.   Normal judgment.  No apparent acute distress.  Impression and Recommendations:    1. Change in consistency of stool - DG Abd 1 View; Future Will get KUB to check for constipation. Discussed using miralax and increasing fiber to regulate stools.  Patient aware of signs/symptoms requiring further/urgent evaluation.    Follow-up pending results and as needed.    I discussed the assessment and treatment plan with the patient. The patient was provided an opportunity to ask questions and all were answered. The patient agreed with the plan and demonstrated an understanding of the instructions.   The patient was advised to call back or seek an in-person evaluation if the symptoms worsen or if the condition fails to improve as anticipated.  I provided 20 minutes of non-face-to-face time during this TELEPHONE encounter.    Clayborne Dana, NP

## 2022-12-07 NOTE — Telephone Encounter (Signed)
Pt has had diarrhea for last several days and does not think she can come in. Pt was advised she may at least need to do a virtual visit with someone but I would send the note back for her since she wanted to talk to the CMA first.

## 2022-12-07 NOTE — Telephone Encounter (Signed)
Called and scheduled VV with Hyman Hopes

## 2022-12-07 NOTE — Progress Notes (Signed)
Diarrhea x 3 days  Has taken Pepto bismol  She states prior to this, no BM's x 2 weeks, did not use any laxatives to help   She thinks she ate some bad hamburger

## 2022-12-16 ENCOUNTER — Encounter: Payer: Self-pay | Admitting: Family Medicine

## 2022-12-16 ENCOUNTER — Ambulatory Visit: Payer: BC Managed Care – PPO | Admitting: Family Medicine

## 2022-12-16 VITALS — BP 112/64 | HR 84 | Temp 98.8°F | Ht 66.0 in | Wt 182.1 lb

## 2022-12-16 DIAGNOSIS — G5601 Carpal tunnel syndrome, right upper limb: Secondary | ICD-10-CM

## 2022-12-16 DIAGNOSIS — Z1211 Encounter for screening for malignant neoplasm of colon: Secondary | ICD-10-CM

## 2022-12-16 DIAGNOSIS — K219 Gastro-esophageal reflux disease without esophagitis: Secondary | ICD-10-CM

## 2022-12-16 NOTE — Patient Instructions (Addendum)
Try the Protonix again for the next month.   Call Dr. Damien Fusi office regarding our neck scan.   Try to drink 55-60 oz of water daily outside of exercise.  If needed: Try 2 tablespoons of milk of mag in 4 oz of warm prune juice. Do that and wait a couple hours. If no improvement, try a Dulcolax suppository and then let me know if we are still having issues.   Wear the brace at night and during aggravating activities.   Call Center for [facility] at [phone] for an appointment.  They are located at [address], [place], [place], [address] (right across the hall from our office).  Let us know if you need anything.

## 2022-12-16 NOTE — Progress Notes (Signed)
Musculoskeletal Exam  Patient: April Ho DOB: 07-Jul-1976  DOS: 12/16/2022  SUBJECTIVE:  Chief Complaint:   Chief Complaint  Patient presents with   Hand Pain    Right hand pain and swelling    Constipation    April Ho is a 47 y.o.  female for evaluation and treatment of R hand pain.   Onset:  2 weeks ago. No inj or change in activity.  Location: R hand, entire hand Character:   pins/needles   Progression of issue:  is unchanged Associated symptoms: swelling and numbness No redness, bruising Treatment: to date has been: none.   Neurovascular symptoms: no  She has been dealing with a burning in her throat for several months.  She went to the ENT team and laryngoscopy was unremarkable.  A CT of her neck was ordered but she never heard anything about scheduling this.  She was given omeprazole 40 mg twice daily to take.  She was compliant for a month but did not notice any improvement.  No unexplained weight changes or bleeding.  Patient was seen on 5/1 for constipation.  X-ray showed marked stool buildup.  She drinks around 16 ounces of water daily.  Diet is fair.  She is having more bowel movements that are alternating between hard/small and normal.  She is not having any pain.  No nausea or vomiting.  Appetite is not fully back to normal.  Past Medical History:  Diagnosis Date   Genital warts    GERD (gastroesophageal reflux disease)    Psoriasis     Objective: VITAL SIGNS: BP 112/64 (BP Location: Left Arm, Patient Position: Sitting, Cuff Size: Normal)   Pulse 84   Temp 98.8 F (37.1 C) (Oral)   Ht 5\' 6"  (1.676 m)   Wt 182 lb 2 oz (82.6 kg)   SpO2 99%   BMI 29.40 kg/m  Constitutional: Well formed, well developed. No acute distress. Heart: RRR Abd: BS+, S, NT, ND Thorax & Lungs: CTAB. No accessory muscle use Musculoskeletal: R hand.   Normal active range of motion: yes.   Normal passive range of motion: yes Tenderness to palpation: no Deformity:  no Ecchymosis: no Tests positive: Phalen's Tests negative: Tinel's Neurologic: Normal sensory function of the left hand, slightly decreased sensation of the right hand. No focal deficits noted.  Grip strength adequate. Psychiatric: Normal mood. Age appropriate judgment and insight. Alert & oriented x 3.    Assessment:  Carpal tunnel syndrome of right wrist  Gastroesophageal reflux disease, unspecified whether esophagitis present  Colon cancer screening - Plan: Cologuard  Plan: Brace given today.  If no improvement in next few weeks, will consider injection versus referral to hand.  Wear the brace during aggravating activities and at night. Chronic, probably not controlled.  She failed 1 month of omeprazole.  Transition to Protonix 40 mg daily.  She will reach out to her ENT who is going to order a scan of her neck but she had never heard anything. She needs to stay hydrated.  Constipation cocktail information provided in her paperwork should she needed.  Seems the pain is much better from when she was seen on 5/1.  We did go over her x-ray showing her all the stool. Cologuard ordered after discussing risks and benefits versus colonoscopy. The patient voiced understanding and agreement to the plan.   April Roche China, DO 12/16/22  2:43 PM

## 2022-12-29 ENCOUNTER — Other Ambulatory Visit (HOSPITAL_COMMUNITY)
Admission: RE | Admit: 2022-12-29 | Discharge: 2022-12-29 | Disposition: A | Discharge status: Home / Self Care | Payer: BC Managed Care – PPO | Source: Ambulatory Visit | Attending: Family | Admitting: Family

## 2022-12-29 ENCOUNTER — Ambulatory Visit: Payer: BC Managed Care – PPO | Admitting: Family

## 2022-12-29 ENCOUNTER — Encounter: Payer: Self-pay | Admitting: Family

## 2022-12-29 VITALS — BP 128/64 | HR 94 | Ht 66.0 in | Wt 183.8 lb

## 2022-12-29 DIAGNOSIS — N760 Acute vaginitis: Secondary | ICD-10-CM | POA: Diagnosis not present

## 2022-12-29 MED ORDER — METRONIDAZOLE 0.75 % VA GEL: 1.0000 | Freq: Every day | VAGINAL | 0 refills | Status: DC

## 2022-12-29 NOTE — Progress Notes (Signed)
April Ho is a 47 y.o. female with the following history as recorded in [employer]:  Patient Active Problem List   Diagnosis Date Noted   Psoriasis 10/18/2021   Genital warts 03/22/2018    Current Outpatient Medications  Medication Sig Dispense Refill   metroNIDAZOLE (METROGEL) 0.75 % vaginal gel Place 1 Applicatorful vaginally at bedtime. 70 g 0   Emollient (CETAPHIL) cream Apply topically as needed. (Patient not taking: Reported on 12/29/2022) 90 g 0   EPINEPHrine 0.3 mg/0.3 mL IJ SOAJ injection Inject 0.3 mLs (0.3 mg total) into the muscle as needed for anaphylaxis. (Patient not taking: Reported on 12/07/2022) 1 each 1   fluticasone (FLONASE) 50 MCG/ACT nasal spray Place 2 sprays into both nostrils daily. (Patient not taking: Reported on 12/29/2022) 16 g 6   levocetirizine (XYZAL) 5 MG tablet TAKE 1 TABLET(5 MG) BY MOUTH EVERY EVENING (Patient not taking: Reported on 12/29/2022) 90 tablet 2   naproxen (NAPROSYN) 500 MG tablet Take 1 tablet (500 mg total) by mouth 2 (two) times daily. (Patient not taking: Reported on 12/29/2022) 30 tablet 0   ondansetron (ZOFRAN-ODT) 4 MG disintegrating tablet DISSOLVE 1 TABLET(4 MG) ON THE TONGUE EVERY 8 HOURS AS NEEDED FOR NAUSEA OR VOMITING (Patient not taking: Reported on 12/29/2022) 20 tablet 0   pantoprazole (PROTONIX) 40 MG tablet Take 1 tablet (40 mg total) by mouth 2 (two) times daily before a meal. (Patient not taking: Reported on 12/29/2022) 60 tablet 6   polyethylene glycol (MIRALAX) 17 g packet Take 17 g by mouth daily. (Patient not taking: Reported on 12/29/2022) 14 each 0   Risankizumab-rzaa (SKYRIZI) 150 MG/ML SOSY Inject 150 mg into the skin every 30 (thirty) days. (Patient not taking: Reported on 12/29/2022) 0.56 mL    valACYclovir (VALTREX) 500 MG tablet Take 1 tablet (500 mg total) by mouth daily. (Patient not taking: Reported on 12/29/2022) 90 tablet 2   No current facility-administered medications for this visit.    Allergies: Cat hair extract,  Dog epithelium, and Dust mite extract  Past Medical History:  Diagnosis Date   Genital warts    GERD (gastroesophageal reflux disease)    Psoriasis     Past Surgical History:  Procedure Laterality Date   CESAREAN SECTION  03/25/1998   TONSILLECTOMY     WISDOM TOOTH EXTRACTION      Family History  Problem Relation Age of Onset   Asthma Paternal Grandmother    Multiple sclerosis Mother    Diabetes Father    Kidney failure Father    Pancreatic disease Father    Hypertension Father    Heart attack Father    Cancer Neg Hx    Colon cancer Neg Hx    Esophageal cancer Neg Hx    Rectal cancer Neg Hx    Stomach cancer Neg Hx     Social History   Tobacco Use   Smoking status: Never   Smokeless tobacco: Never  Substance Use Topics   Alcohol use: No    Subjective:   Concerned for vaginal discharge x 1 week; per patient, did use OTC douche product prior to onset of symptoms; denies STD exposure; notes she has not had a period in 4-5 years; notes she stopped Depo and her period never re-started; it does appear she is overdue for pap smear- ? If she has GYN;    Objective:  Vitals:   12/29/22 1430  BP: 128/64  Pulse: 94  SpO2: 99%  Weight: 183 lb 12.8 oz (83.4 kg)  Height: 5\' 6"  (1.676 m)    General: Well developed, well nourished, in no acute distress  Skin : Warm and dry.  Head: Normocephalic and atraumatic  Lungs: Respirations unlabored;  Neurologic: Alert and oriented; speech intact; face symmetrical; moves all extremities well; CNII-XII intact without focal deficit    Assessment:  1. Acute vaginitis     Plan:   Vaginal swab updated; suspect BV based on description of symptoms- stressed not to use douche products; Rx for Metrogel vaginal ( patient defers oral tablets)- use as directed;  She also plans to call her PCP next week to discuss options for management of hand pain- I did offer to update referral to hand specialist but she wants to talk to PCP first;     No follow-ups on file.  No orders of the defined types were placed in this encounter.   Requested Prescriptions   Signed Prescriptions Disp Refills   metroNIDAZOLE (METROGEL) 0.75 % vaginal gel 70 g 0    Sig: Place 1 Applicatorful vaginally at bedtime.

## 2023-01-03 ENCOUNTER — Ambulatory Visit (INDEPENDENT_AMBULATORY_CARE_PROVIDER_SITE_OTHER): Payer: BC Managed Care – PPO | Admitting: Family Medicine

## 2023-01-03 ENCOUNTER — Encounter: Payer: Self-pay | Admitting: Family Medicine

## 2023-01-03 VITALS — BP 120/68 | HR 88 | Temp 98.5°F | Ht 66.0 in | Wt 179.4 lb

## 2023-01-03 DIAGNOSIS — M79641 Pain in right hand: Secondary | ICD-10-CM | POA: Diagnosis not present

## 2023-01-03 LAB — CERVICOVAGINAL ANCILLARY ONLY
Bacterial Vaginitis (gardnerella): POSITIVE — AB
Candida Glabrata: NEGATIVE
Candida Vaginitis: NEGATIVE
Chlamydia: NEGATIVE
Comment: NEGATIVE
Comment: NEGATIVE
Comment: NEGATIVE
Comment: NEGATIVE
Comment: NEGATIVE
Comment: NORMAL
Neisseria Gonorrhea: NEGATIVE
Trichomonas: NEGATIVE

## 2023-01-03 MED ORDER — METHYLPREDNISOLONE ACETATE 40 MG/ML IJ SUSP
20.0000 mg | Freq: Once | INTRAMUSCULAR | Status: AC
  Administered 2023-01-03: 20 mg via INTRA_ARTICULAR

## 2023-01-03 MED ORDER — TRAMADOL HCL 50 MG PO TABS: 50.0000 mg | ORAL_TABLET | Freq: Three times a day (TID) | ORAL | 0 refills | Status: AC | PRN

## 2023-01-03 NOTE — Progress Notes (Signed)
Chief Complaint  Patient presents with   Follow-up    Hand    Subjective: Patient is a 47 y.o. female here for follow-up.  Patient returning for right hand discomfort.  Burning/tingling sensation in her hand with associated swelling.  No fevers or redness.  Worse at night.  Wore a brace but it was too big.  Her friend gave her 1 which seems to help a little bit.  Overall getting worse.  Interested in seeing a specialist and having an injection.  Past Medical History:  Diagnosis Date   Genital warts    GERD (gastroesophageal reflux disease)    Psoriasis     Objective: BP 120/68 (BP Location: Left Arm, Patient Position: Sitting, Cuff Size: Normal)   Pulse 88   Temp 98.5 F (36.9 C) (Oral)   Ht 5\' 6"  (1.676 m)   Wt 179 lb 6 oz (81.4 kg)   SpO2 99%   BMI 28.95 kg/m  General: Awake, appears stated age Heart: Brisk capillary refill MSK: Mild TTP over the flexors of the digits approximately over the hand; no obvious deformity; mild tissue swelling noted over the palm without excessive warmth or fluctuance; there is no tenderness over the wrist or forearm flexor tendons Neuro: Negative Tinel's, today there is no positive Phalen's test either Lungs: No accessory muscle use Psych: Age appropriate judgment and insight, normal affect and mood  Procedure note: Right carpal tunnel injection Verbal consent obtained. Palmaris longus ID'd and marked with a pen. Lateral to this, an area was cleaned with alcohol and freeze spray used for topical anesthesia. A 30 g needle was inserted at a 30 degree angle to introduce 20 mg of Depomedrol and 0.5 cc of 2% lidocaine w/o epi. A band-aid was placed. Pt tolerated well. No immediate complications noted.    Assessment and Plan: Right hand pain - Plan: methylPREDNISolone acetate (DEPO-MEDROL) injection 20 mg  Injection today.  Ice, continue bracing, number for her hand specialist provided. The patient voiced understanding and agreement to the  plan.  Jilda Roche [place], DO 01/03/23  10:49 AM

## 2023-01-03 NOTE — Patient Instructions (Addendum)
Please call the [facility] team at: [phone]  Let us know if you need a refill.   Ice/cold pack over area for 10-15 min twice daily.  OK to take Tylenol 1000 mg (2 extra strength tabs) or 975 mg (3 regular strength tabs) every 6 hours as needed.  Do not drink alcohol, do any illicit/street drugs, drive or do anything that requires alertness while on this medicine.   Let us know if you need anything.

## 2023-01-06 DIAGNOSIS — G5601 Carpal tunnel syndrome, right upper limb: Secondary | ICD-10-CM | POA: Diagnosis not present

## 2023-01-17 DIAGNOSIS — G5601 Carpal tunnel syndrome, right upper limb: Secondary | ICD-10-CM | POA: Diagnosis not present

## 2023-01-18 ENCOUNTER — Other Ambulatory Visit: Payer: Self-pay | Admitting: Family Medicine

## 2023-03-17 ENCOUNTER — Other Ambulatory Visit: Payer: Self-pay | Admitting: Family Medicine

## 2023-03-17 DIAGNOSIS — Z1211 Encounter for screening for malignant neoplasm of colon: Secondary | ICD-10-CM

## 2023-03-27 ENCOUNTER — Other Ambulatory Visit: Payer: Self-pay | Admitting: Family Medicine

## 2023-03-27 DIAGNOSIS — R110 Nausea: Secondary | ICD-10-CM

## 2023-03-27 DIAGNOSIS — R21 Rash and other nonspecific skin eruption: Secondary | ICD-10-CM

## 2023-04-16 ENCOUNTER — Other Ambulatory Visit: Payer: Self-pay

## 2023-04-16 ENCOUNTER — Encounter (HOSPITAL_BASED_OUTPATIENT_CLINIC_OR_DEPARTMENT_OTHER): Payer: Self-pay | Admitting: *Deleted

## 2023-04-16 ENCOUNTER — Emergency Department (HOSPITAL_BASED_OUTPATIENT_CLINIC_OR_DEPARTMENT_OTHER)
Admission: EM | Admit: 2023-04-16 | Discharge: 2023-04-16 | Disposition: A | Discharge status: Home / Self Care | Payer: BC Managed Care – PPO | Attending: Emergency Medicine | Admitting: Emergency Medicine

## 2023-04-16 DIAGNOSIS — K219 Gastro-esophageal reflux disease without esophagitis: Secondary | ICD-10-CM | POA: Diagnosis not present

## 2023-04-16 DIAGNOSIS — J029 Acute pharyngitis, unspecified: Secondary | ICD-10-CM | POA: Diagnosis not present

## 2023-04-16 LAB — CBC WITH DIFFERENTIAL/PLATELET
Abs Immature Granulocytes: 0.01 10*3/uL (ref 0.00–0.07)
Basophils Absolute: 0 10*3/uL (ref 0.0–0.1)
Basophils Relative: 0 %
Eosinophils Absolute: 0.1 10*3/uL (ref 0.0–0.5)
Eosinophils Relative: 3 %
HCT: 28.3 % — ABNORMAL LOW (ref 36.0–46.0)
Hemoglobin: 10.2 g/dL — ABNORMAL LOW (ref 12.0–15.0)
Immature Granulocytes: 0 %
Lymphocytes Relative: 30 %
Lymphs Abs: 0.7 10*3/uL (ref 0.7–4.0)
MCH: 47.4 pg — ABNORMAL HIGH (ref 26.0–34.0)
MCHC: 36 g/dL (ref 30.0–36.0)
MCV: 131.6 fL — ABNORMAL HIGH (ref 80.0–100.0)
Monocytes Absolute: 0.1 10*3/uL (ref 0.1–1.0)
Monocytes Relative: 4 %
Neutro Abs: 1.5 10*3/uL — ABNORMAL LOW (ref 1.7–7.7)
Neutrophils Relative %: 63 %
Platelets: 227 10*3/uL (ref 150–400)
RBC: 2.15 MIL/uL — ABNORMAL LOW (ref 3.87–5.11)
RDW: 12.7 % (ref 11.5–15.5)
WBC: 2.5 10*3/uL — ABNORMAL LOW (ref 4.0–10.5)
nRBC: 0 % (ref 0.0–0.2)

## 2023-04-16 LAB — COMPREHENSIVE METABOLIC PANEL
ALT: 18 U/L (ref 0–44)
AST: 17 U/L (ref 15–41)
Albumin: 4.6 g/dL (ref 3.5–5.0)
Alkaline Phosphatase: 95 U/L (ref 38–126)
Anion gap: 8 (ref 5–15)
BUN: 11 mg/dL (ref 6–20)
CO2: 26 mmol/L (ref 22–32)
Calcium: 9.1 mg/dL (ref 8.9–10.3)
Chloride: 106 mmol/L (ref 98–111)
Creatinine, Ser: 0.64 mg/dL (ref 0.44–1.00)
GFR, Estimated: >60 mL/min (ref >60)
Glucose, Bld: 91 mg/dL (ref 70–99)
Potassium: 3.7 mmol/L (ref 3.5–5.1)
Sodium: 140 mmol/L (ref 135–145)
Total Bilirubin: 0.9 mg/dL (ref 0.3–1.2)
Total Protein: 7.3 g/dL (ref 6.5–8.1)

## 2023-04-16 LAB — GROUP A STREP BY PCR: Group A Strep by PCR: NOT DETECTED

## 2023-04-16 LAB — LIPASE, BLOOD: Lipase: 22 U/L (ref 11–51)

## 2023-04-16 MED ORDER — PANTOPRAZOLE SODIUM 20 MG PO TBEC: 20.0000 mg | DELAYED_RELEASE_TABLET | Freq: Every day | ORAL | 0 refills | Status: DC

## 2023-04-16 MED ORDER — ALUM & MAG HYDROXIDE-SIMETH 200-200-20 MG/5ML PO SUSP
30.0000 mL | Freq: Once | ORAL | Status: AC
  Administered 2023-04-16: 30 mL via ORAL
  Filled 2023-04-16: qty 30

## 2023-04-16 MED ORDER — SUCRALFATE 1 G PO TABS
1.0000 g | ORAL_TABLET | Freq: Three times a day (TID) | ORAL | 0 refills | Status: AC
Start: 2023-04-16 — End: 2023-04-30

## 2023-04-16 MED ORDER — LIDOCAINE VISCOUS HCL 2 % MT SOLN
15.0000 mL | Freq: Once | OROMUCOSAL | Status: AC
  Administered 2023-04-16: 15 mL via ORAL
  Filled 2023-04-16: qty 15

## 2023-04-16 MED ORDER — MAALOX MAX 400-400-40 MG/5ML PO SUSP: 10.0000 mL | Freq: Four times a day (QID) | ORAL | 0 refills | Status: AC | PRN

## 2023-04-16 NOTE — ED Provider Notes (Addendum)
[place] [facility] Provider Note   CSN: [id number] Arrival date & time: 04/16/23  1654    History  Chief Complaint  Patient presents with   Sore Throat    April Ho is a 47 y.o. female here for evaluation of sore throat and reflux symptoms.  Patient states over the last 2 weeks she has had a burning sensation in her throat.  Worse when she lays flat feels like she has acidic liquids that come up into her throat.  When she eats or drinks anything it feels like there is burning that comes back up.  Sometimes states she burps up clear and yellow liquids.  States she has had this previously.  States she was seen by ENT and had a scan as well as a "scope."  No fever, neck stiffness, rigidity, chest pain, shortness of breath, cough abdominal pain, diarrhea, dysuria.  She does also me that she has been taking more anti-inflammatory medication for her carpal tunnel.  She denies any prior blood in her stool or melena.  She has a known goiter from her last ultrasound.  No palpitations, hot or cold intolerances.  States sometimes when she eats things they get stuck however not currently.  HPI     Home Medications Prior to Admission medications   Medication Sig Start Date End Date Taking? Authorizing Provider  alum & mag hydroxide-simeth (MAALOX MAX) 400-400-40 MG/5ML suspension Take 10 mLs by mouth every 6 (six) hours as needed for indigestion. 04/16/23  Yes Tamara Monteith A, PA-C  pantoprazole (PROTONIX) 20 MG tablet Take 1 tablet (20 mg total) by mouth daily. 04/16/23  Yes Aprille Sawhney A, PA-C  sucralfate (CARAFATE) 1 g tablet Take 1 tablet (1 g total) by mouth 4 (four) times daily -  with meals and at bedtime for 14 days. 04/16/23 04/30/23 Yes Christropher Gintz A, PA-C  Emollient (CETAPHIL) cream Apply topically as needed. 10/01/22   Curatolo, Adam, DO  EPINEPHrine 0.3 mg/0.3 mL IJ SOAJ injection Inject 0.3 mLs (0.3 mg total) into the muscle as needed for  anaphylaxis. 01/29/19   Sharlene Dory, DO  fluconazole (DIFLUCAN) 150 MG tablet TAKE 1 TABLET BY MOUTH EVERY 72 HOURS FOR 3 DOSES 01/18/23   Carmelia Roller, Jilda Roche, DO  fluticasone [facility]) 50 MCG/ACT nasal spray Place 2 sprays into both nostrils daily. 08/22/22   Sharlene Dory, DO  levocetirizine (XYZAL) 5 MG tablet TAKE 1 TABLET(5 MG) BY MOUTH EVERY EVENING 08/22/22   Wendling, Jilda Roche, DO  metroNIDAZOLE (METROGEL) 0.75 % vaginal gel Place 1 Applicatorful vaginally at bedtime. 12/29/22   Olive Bass, FNP  ondansetron (ZOFRAN-ODT) 4 MG disintegrating tablet DISSOLVE 1 TABLET(4 MG) ON THE TONGUE EVERY 8 HOURS AS NEEDED FOR NAUSEA OR VOMITING 11/29/22   Wendling, Jilda Roche, DO  polyethylene glycol (MIRALAX) 17 g packet Take 17 g by mouth daily. 06/29/22   Wendling, Jilda Roche, DO  Risankizumab-rzaa (SKYRIZI) 150 MG/ML SOSY Inject 150 mg into the skin every 30 (thirty) days. 11/24/21   Sharlene Dory, DO  valACYclovir (VALTREX) 500 MG tablet Take 1 tablet (500 mg total) by mouth daily. 11/02/22   Sharlene Dory, DO      Allergies    Cat hair extract, Dog epithelium, and Dust mite extract    Review of Systems   Review of Systems  Constitutional: Negative.   HENT:  Positive for sore throat. Negative for trouble swallowing and voice change.   Respiratory: Negative.  Cardiovascular: Negative.   Gastrointestinal: Negative.   Genitourinary: Negative.   Musculoskeletal: Negative.   Skin: Negative.   Neurological: Negative.   All other systems reviewed and are negative.   Physical Exam Updated Vital Signs BP 100/61   Pulse 85   Temp 98.8 F (37.1 C) (Oral)   Resp 16   SpO2 100%  Physical Exam Vitals and nursing note reviewed.  Constitutional:      General: She is not in acute distress.    Appearance: She is well-developed. She is not ill-appearing, toxic-appearing or diaphoretic.  HENT:     Head: Normocephalic and atraumatic.      Jaw: There is normal jaw occlusion.     Comments: No drooling, dysphagia or trismus.    Nose: No congestion or rhinorrhea.     Mouth/Throat:     Lips: Pink.     Mouth: Mucous membranes are moist.     Pharynx: Oropharynx is clear. Uvula midline.     Tonsils: No tonsillar exudate or tonsillar abscesses. 0 on the right. 0 on the left.     Comments: Posterior oropharynx clear.  Uvula midline.  No point of secretions.  Tongue midline.  No angioedema.  Sublingual area soft. Eyes:     Pupils: Pupils are equal, round, and reactive to light.  Neck:     Thyroid: No thyromegaly or thyroid tenderness.     Trachea: Trachea and phonation normal.     Comments: No erythema, warmth.  No obvious large masses. No phonation changes No Lymphadenopathy Cardiovascular:     Rate and Rhythm: Normal rate.  Pulmonary:     Effort: No respiratory distress.  Abdominal:     General: Bowel sounds are normal. There is no distension.     Palpations: Abdomen is soft.     Tenderness: There is no abdominal tenderness. There is no right CVA tenderness, left CVA tenderness or guarding. Negative signs include Murphy's sign.     Hernia: No hernia is present.     Comments: Soft, nontender  Musculoskeletal:        General: Normal range of motion.     Cervical back: Full passive range of motion without pain, normal range of motion and neck supple.  Lymphadenopathy:     Cervical: No cervical adenopathy.  Skin:    General: Skin is warm and dry.  Neurological:     General: No focal deficit present.     Mental Status: She is alert.  Psychiatric:        Mood and Affect: Mood normal.     ED Results / Procedures / Treatments   Labs (all labs ordered are listed, but only abnormal results are displayed) Labs Reviewed  CBC WITH DIFFERENTIAL/PLATELET - Abnormal; Notable for the following components:      Result Value   WBC 2.5 (*)    RBC 2.15 (*)    Hemoglobin 10.2 (*)    HCT 28.3 (*)    MCV 131.6 (*)    MCH 47.4  (*)    Neutro Abs 1.5 (*)    All other components within normal limits  GROUP A STREP BY PCR  COMPREHENSIVE METABOLIC PANEL  LIPASE, BLOOD    EKG None  Radiology No results found.  Procedures Procedures    Medications Ordered in ED Medications  alum & mag hydroxide-simeth (MAALOX/MYLANTA) 200-200-20 MG/5ML suspension 30 mL (30 mLs Oral Given 04/16/23 1730)    And  lidocaine (XYLOCAINE) 2 % viscous mouth solution 15 mL (15 mLs  Oral Given 04/16/23 1731)    ED Course/ Medical Decision Making/ A&P    47 year old here for evaluation of sore throat and reflux type symptoms over the last 3 weeks.  Has a history of similar.  States now she felt that she has acidic liquid that comes up into her throat which causes her to have a sore throat.  Worse at night when she lays flat.  She is not currently on any medication for this.  She was seen by ENT who had a scope which was "clear" per patient.  She was told she needed to be on reflux medications.  Has been taking NSAIDs for her carpal tunnel.  No bloody stool.  No abdominal pain.  Her heart and lungs are clear.  Abdomen soft nontender.  Posterior pharynx is clear.  She is no drooling, dysphagia or trismus.  Her sublingual area soft.  She has no posterior erythema.  Uvula is midline.  Plan on labs, pain management and reassess  Labs personally viewed interpreted:  CBC leukopenia 2.5, similar to prior Metabolic panel without significant abnormality lipase 22 Strep negative  Patient reassessed.  Given GI cocktail.  Significant improvement in symptoms.  Tolerating p.o. intake.  I suspect her likely "sore throat" is likely due to reflux based off of history and exam.  I see no obvious infectious process.  Low suspicion for PTA, RPA, strep pharyngitis, meningitis, stricture, mass, retained object, GI bleed, cholecystitis, cholelithiasis, obstruction, perforation, GI bleed.  Could also have some degree of PUD given her NSAID use for her carpal tunnel.   I encouraged sensation of NSAIDs, starting Carafate, Protonix as well as as needed Maalox.  Will have her follow-up with GI.  Will have her return for any worsening symptoms.  Tolerating p.o. intake here in the emergency department, repeat abdominal exam benign.  The patient has been appropriately medically screened and/or stabilized in the ED. I have low suspicion for any other emergent medical condition which would require further screening, evaluation or treatment in the ED or require inpatient management.  Patient is hemodynamically stable and in no acute distress.  Patient able to ambulate in department prior to ED.  Evaluation does not show acute pathology that would require ongoing or additional emergent interventions while in the emergency department or further inpatient treatment.  I have discussed the diagnosis with the patient and answered all questions.  Pain is been managed while in the emergency department and patient has no further complaints prior to discharge.  Patient is comfortable with plan discussed in room and is stable for discharge at this time.  I have discussed strict return precautions for returning to the emergency department.  Patient was encouraged to follow-up with PCP/specialist refer to at discharge.                                Medical Decision Making Amount and/or Complexity of Data Reviewed External Data Reviewed: labs, radiology and notes. Labs: ordered. Decision-making details documented in ED Course.  Risk OTC drugs. Prescription drug management. Parenteral controlled substances. Diagnosis or treatment significantly limited by social determinants of health.     Final Clinical Impression(s) / ED Diagnoses Final diagnoses:  Gastroesophageal reflux disease, unspecified whether esophagitis present    Rx / DC Orders ED Discharge Orders          Ordered    sucralfate (CARAFATE) 1 g tablet  3 times daily with meals &  bedtime        04/16/23 1856     pantoprazole (PROTONIX) 20 MG tablet  Daily        04/16/23 1856    alum & mag hydroxide-simeth (MAALOX MAX) 400-400-40 MG/5ML suspension  Every 6 hours PRN        04/16/23 1856                Majesti Gambrell A, PA-C 04/16/23 1947    Rexford Maus, DO 04/16/23 2355

## 2023-04-16 NOTE — Discharge Instructions (Signed)
It was a pleasure to care of you here in the emergency department today  We have started you on a few medications.  Take as prescribed.  Make sure to follow-up with your primary care provider to possibly see the gastroenterologist  Return for new or worsening symptoms

## 2023-04-16 NOTE — ED Notes (Signed)
Pateint has a history of acid reflux and has been exp. Sore throat for last 2 week. Can't keep anything down.  When she Eat or drink anything it burns when it get to back of throat.

## 2023-04-16 NOTE — ED Triage Notes (Signed)
Pt here for sore thoat x2 weeks along with acid reflux which is causing burning when she swallows and some vomiting of clear liquid.  No fever or chills with this.

## 2023-04-19 ENCOUNTER — Telehealth: Payer: Self-pay | Admitting: Family Medicine

## 2023-04-19 ENCOUNTER — Other Ambulatory Visit: Payer: Self-pay | Admitting: Family Medicine

## 2023-04-19 MED ORDER — PANTOPRAZOLE SODIUM 20 MG PO TBEC: 20.0000 mg | DELAYED_RELEASE_TABLET | Freq: Every day | ORAL | 0 refills | Status: AC

## 2023-04-19 NOTE — Telephone Encounter (Signed)
Called the patient and she stated she still has a cough but cannot come in due to having no money for a copay.  I suggested she get something OTC.

## 2023-04-19 NOTE — Telephone Encounter (Signed)
[facility] on [address]

## 2023-04-19 NOTE — Telephone Encounter (Signed)
Ok to refill 

## 2023-04-19 NOTE — Telephone Encounter (Signed)
She cannot make an appointment at this time. She would like to have her pantoprazole refilled to the [facility] in [place]

## 2023-04-19 NOTE — Telephone Encounter (Signed)
Patient would like a refill on the benzonatate. Pt said she took the last 2 for her cough but called in refill request at the pharmacy. Please call to advise if able to do it.

## 2023-04-19 NOTE — Telephone Encounter (Signed)
Sent into [facility] patient is aware

## 2023-04-19 NOTE — Addendum Note (Signed)
Addended by: Scharlene Gloss B on: 04/19/2023 03:05 PM   Modules accepted: Orders

## 2023-04-19 NOTE — Telephone Encounter (Signed)
We haven't rx'd her since January? If she is still having issues, really needs to be seen. Ty.

## 2023-04-19 NOTE — Telephone Encounter (Signed)
Patient informed of PCP instructions. 

## 2023-04-28 ENCOUNTER — Other Ambulatory Visit (HOSPITAL_COMMUNITY)
Admission: RE | Admit: 2023-04-28 | Discharge: 2023-04-28 | Disposition: A | Discharge status: Home / Self Care | Payer: BC Managed Care – PPO | Source: Ambulatory Visit | Attending: Family Medicine | Admitting: Family Medicine

## 2023-04-28 ENCOUNTER — Encounter: Payer: Self-pay | Admitting: Family Medicine

## 2023-04-28 ENCOUNTER — Ambulatory Visit (INDEPENDENT_AMBULATORY_CARE_PROVIDER_SITE_OTHER): Payer: BC Managed Care – PPO | Admitting: Family Medicine

## 2023-04-28 VITALS — BP 108/79 | HR 80 | Temp 98.2°F | Ht 66.0 in | Wt 172.0 lb

## 2023-04-28 DIAGNOSIS — R300 Dysuria: Secondary | ICD-10-CM | POA: Diagnosis not present

## 2023-04-28 DIAGNOSIS — N3000 Acute cystitis without hematuria: Secondary | ICD-10-CM | POA: Diagnosis not present

## 2023-04-28 DIAGNOSIS — N898 Other specified noninflammatory disorders of vagina: Secondary | ICD-10-CM | POA: Diagnosis not present

## 2023-04-28 LAB — POC URINALSYSI DIPSTICK (AUTOMATED)
Bilirubin, UA: NEGATIVE
Blood, UA: NEGATIVE
Glucose, UA: NEGATIVE
Ketones, UA: NEGATIVE
Nitrite, UA: NEGATIVE
Protein, UA: NEGATIVE
Spec Grav, UA: 1.015 (ref 1.010–1.025)
Urobilinogen, UA: 0.2 E.U./dL
pH, UA: 6.5 (ref 5.0–8.0)

## 2023-04-28 MED ORDER — SULFAMETHOXAZOLE-TRIMETHOPRIM 800-160 MG PO TABS
1.0000 | ORAL_TABLET | Freq: Two times a day (BID) | ORAL | 0 refills | Status: AC
Start: 2023-04-28 — End: 2023-05-01

## 2023-04-28 NOTE — Progress Notes (Signed)
Acute Office Visit  Subjective:     Patient ID: April Ho, female    DOB: 1976/07/06, 47 y.o.   MRN: [id number]  Chief Complaint  Patient presents with   Vaginal Discharge     Patient is in today for vaginal discharge, dysuria.   Discussed the use of AI scribe software for clinical note transcription with the patient, who gave verbal consent to proceed.  History of Present Illness   The patient presents with a chief complaint of vaginal discharge, described as brownish in color, persisting for approximately two to three weeks. She denies any associated odor, but reports a sensation of irritation and burning during urination.   The patient denies any recent sexual activity, with the last encounter approximately eight to ten months prior. She reports cessation of menstrual periods for over a year, following discontinuation of Depo-Provera.          All review of systems negative except what is listed in the HPI      Objective:    BP 108/79   Pulse 80   Temp 98.2 F (36.8 C) (Oral)   Ht 5\' 6"  (1.676 m)   Wt 172 lb (78 kg)   SpO2 98%   BMI 27.76 kg/m    Physical Exam Vitals reviewed.  Constitutional:      Appearance: Normal appearance.  Cardiovascular:     Rate and Rhythm: Normal rate and regular rhythm.     Heart sounds: Normal heart sounds.  Pulmonary:     Effort: Pulmonary effort is normal.     Breath sounds: Normal breath sounds.  Abdominal:     Tenderness: There is no abdominal tenderness. There is no right CVA tenderness, left CVA tenderness or guarding.  Skin:    General: Skin is warm and dry.  Neurological:     Mental Status: She is alert and oriented to person, place, and time.  Psychiatric:        Mood and Affect: Mood normal.        Behavior: Behavior normal.        Thought Content: Thought content normal.        Judgment: Judgment normal.     Results for orders placed or performed in visit on 04/28/23  POCT Urinalysis Dipstick  (Automated)  Result Value Ref Range   Color, UA yellow    Clarity, UA clear    Glucose, UA Negative Negative   Bilirubin, UA negative    Ketones, UA negative    Spec Grav, UA 1.015 1.010 - 1.025   Blood, UA negative    pH, UA 6.5 5.0 - 8.0   Protein, UA Negative Negative   Urobilinogen, UA 0.2 0.2 or 1.0 E.U./dL   Nitrite, UA negative    Leukocytes, UA Moderate (2+) (A) Negative        Assessment & Plan:   Problem List Items Addressed This Visit   None Visit Diagnoses     Dysuria    -  Primary   Relevant Medications   sulfamethoxazole-trimethoprim (BACTRIM DS) 800-160 MG tablet   Other Relevant Orders   POCT Urinalysis Dipstick (Automated) (Completed)   Urine Culture   Vaginal discharge       Relevant Orders   Cervicovaginal ancillary only         Vaginal Discharge Brownish discharge for 2-3 weeks with no odor. Not sexually active.  -Self-collected vaginal swab for yeast, BV, and STD testing.  Urinary Symptoms Burning with urination, urine suggestive of UTI  on dipstick. -Send urine for culture. -Start empiric antibiotic therapy for UTI (Bactrim) -If culture results necessitate a change in antibiotic      Meds ordered this encounter  Medications   sulfamethoxazole-trimethoprim (BACTRIM DS) 800-160 MG tablet    Sig: Take 1 tablet by mouth 2 (two) times daily for 3 days.    Dispense:  6 tablet    Refill:  0    Order Specific Question:   Supervising Provider    Answer:   Danise Edge A [[id number]]    Return if symptoms worsen or fail to improve.  Clayborne Dana, NP

## 2023-05-01 LAB — URINE CULTURE
MICRO NUMBER:: 15495101
SPECIMEN QUALITY:: ADEQUATE

## 2023-05-01 LAB — CERVICOVAGINAL ANCILLARY ONLY
Bacterial Vaginitis (gardnerella): POSITIVE — AB
Candida Glabrata: NEGATIVE
Candida Vaginitis: NEGATIVE
Chlamydia: NEGATIVE
Comment: NEGATIVE
Comment: NEGATIVE
Comment: NEGATIVE
Comment: NEGATIVE
Comment: NEGATIVE
Comment: NORMAL
Neisseria Gonorrhea: NEGATIVE
Trichomonas: NEGATIVE

## 2023-05-01 MED ORDER — CIPROFLOXACIN HCL 250 MG PO TABS
250.0000 mg | ORAL_TABLET | Freq: Two times a day (BID) | ORAL | 0 refills | Status: AC
Start: 2023-05-01 — End: 2023-05-04

## 2023-05-01 NOTE — Addendum Note (Signed)
Addended by: Clayborne Dana on: 05/01/2023 08:35 AM   Modules accepted: Orders

## 2023-05-02 MED ORDER — METRONIDAZOLE 500 MG PO TABS
500.0000 mg | ORAL_TABLET | Freq: Two times a day (BID) | ORAL | 0 refills | Status: AC
Start: 2023-05-02 — End: 2023-05-09

## 2023-05-02 NOTE — Addendum Note (Signed)
Addended by: Hyman Hopes B on: 05/02/2023 07:44 AM   Modules accepted: Orders

## 2023-05-10 ENCOUNTER — Telehealth: Payer: Self-pay | Admitting: Family Medicine

## 2023-05-10 ENCOUNTER — Encounter: Payer: Self-pay | Admitting: Family Medicine

## 2023-05-10 ENCOUNTER — Ambulatory Visit (INDEPENDENT_AMBULATORY_CARE_PROVIDER_SITE_OTHER): Payer: BC Managed Care – PPO | Admitting: Family Medicine

## 2023-05-10 ENCOUNTER — Other Ambulatory Visit: Payer: Self-pay | Admitting: Family Medicine

## 2023-05-10 VITALS — BP 112/68 | HR 98 | Temp 98.9°F | Ht 66.0 in | Wt 170.2 lb

## 2023-05-10 DIAGNOSIS — F411 Generalized anxiety disorder: Secondary | ICD-10-CM | POA: Diagnosis not present

## 2023-05-10 DIAGNOSIS — D539 Nutritional anemia, unspecified: Secondary | ICD-10-CM

## 2023-05-10 DIAGNOSIS — R5383 Other fatigue: Secondary | ICD-10-CM

## 2023-05-10 DIAGNOSIS — G5601 Carpal tunnel syndrome, right upper limb: Secondary | ICD-10-CM | POA: Diagnosis not present

## 2023-05-10 DIAGNOSIS — R21 Rash and other nonspecific skin eruption: Secondary | ICD-10-CM | POA: Diagnosis not present

## 2023-05-10 DIAGNOSIS — D72819 Decreased white blood cell count, unspecified: Secondary | ICD-10-CM

## 2023-05-10 DIAGNOSIS — R634 Abnormal weight loss: Secondary | ICD-10-CM

## 2023-05-10 LAB — COMPREHENSIVE METABOLIC PANEL
ALT: 18 U/L (ref 0–35)
AST: 19 U/L (ref 0–37)
Albumin: 4.3 g/dL (ref 3.5–5.2)
Alkaline Phosphatase: 85 U/L (ref 39–117)
BUN: 14 mg/dL (ref 6–23)
CO2: 27 meq/L (ref 19–32)
Calcium: 9.2 mg/dL (ref 8.4–10.5)
Chloride: 108 meq/L (ref 96–112)
Creatinine, Ser: 0.54 mg/dL (ref 0.40–1.20)
GFR: 110.95 mL/min (ref >60.00)
Glucose, Bld: 92 mg/dL (ref 70–99)
Potassium: 4.1 meq/L (ref 3.5–5.1)
Sodium: 141 meq/L (ref 135–145)
Total Bilirubin: 0.8 mg/dL (ref 0.2–1.2)
Total Protein: 6.3 g/dL (ref 6.0–8.3)

## 2023-05-10 LAB — CBC
HCT: 27.4 % — ABNORMAL LOW (ref 36.0–46.0)
Hemoglobin: 9.3 g/dL — ABNORMAL LOW (ref 12.0–15.0)
MCHC: 33.8 g/dL (ref 30.0–36.0)
MCV: 137.4 fL — ABNORMAL HIGH (ref 78.0–100.0)
Platelets: 226 10*3/uL (ref 150.0–400.0)
RBC: 1.99 Mil/uL — ABNORMAL LOW (ref 3.87–5.11)
RDW: 15.1 % (ref 11.5–15.5)
WBC: 1.9 10*3/uL — CL (ref 4.0–10.5)

## 2023-05-10 LAB — TSH: TSH: 0.55 u[IU]/mL (ref 0.35–5.50)

## 2023-05-10 MED ORDER — TRIAMCINOLONE ACETONIDE 0.025 % EX CREA
1.0000 | TOPICAL_CREAM | Freq: Two times a day (BID) | CUTANEOUS | 0 refills | Status: DC
Start: 2023-05-10 — End: 2023-08-07

## 2023-05-10 MED ORDER — MIRTAZAPINE 7.5 MG PO TABS
7.5000 mg | ORAL_TABLET | Freq: Every day | ORAL | 2 refills | Status: DC
Start: 2023-05-10 — End: 2024-05-13

## 2023-05-10 MED ORDER — TRIAMCINOLONE ACETONIDE 0.1 % EX CREA: 1.0000 | TOPICAL_CREAM | Freq: Two times a day (BID) | CUTANEOUS | 0 refills | Status: DC

## 2023-05-10 NOTE — Telephone Encounter (Signed)
See result message.

## 2023-05-10 NOTE — Patient Instructions (Addendum)
Give [place] 2-3 business days to get the results of your labs back.   Please complete the Cologard kit process.   Call Center for [facility] at [facility] at [phone] for an appointment.  They are located at [address], [place], [place], [address] (right across the hall from our office).  Send me a message or call if you are interested in a 2nd opinion referral to the hand team.   Please consider counseling. Contact [phone] to schedule an appointment or inquire about cost/insurance coverage.  [facility] located at [address], [address], [place], [place].  Phone number = [phone].  Dr. Regan Lemming - Adult Psychiatry.    [facility] located at [address] [place], [place], [place]. Phone number = [phone].   [facility] located at [address] [place], [place].  Phone number = [phone].   [facility] located at [address] [place], [place], [place].  Phone number = [phone].  Aim to do some physical exertion for 150 minutes per week. This is typically divided into 5 days per week, 30 minutes per day. The activity should be enough to get your heart rate up. Anything is better than nothing if you have time constraints.  Let us know if you need anything.

## 2023-05-10 NOTE — Telephone Encounter (Signed)
Faxed addon Put order in EPIC Put in referral. Called left message to call back.

## 2023-05-10 NOTE — Addendum Note (Signed)
Addended by: Scharlene Gloss B on: 05/10/2023 04:50 PM   Modules accepted: Orders

## 2023-05-10 NOTE — Telephone Encounter (Signed)
Please add a smear and refer to hematology. The fatigue could be related to her low hemoglobin levels, WBC historically low which can be seen in AA's, but this is dropping lower than before so want specialty opinion. Ty.

## 2023-05-10 NOTE — Progress Notes (Signed)
Chief Complaint  Patient presents with   not eating well.    Subjective: Patient is a 47 y.o. female here for wt loss.  Over the past 3 mo, has been having decreased appetite. She has lost 13 lbs. No CP, SOB, diarrhea, bleeding. 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  It is causing her appetite to decrease.  She has been referred to gastroenterology several times and most recently the Cologuard was ordered.  She has not gone to any appointment nor has she completed the Cologuard.  Past Medical History:  Diagnosis Date   Genital warts    GERD (gastroesophageal reflux disease)    Psoriasis     Objective: BP 112/68 (BP Location: Right Arm, Patient Position: Sitting, Cuff Size: Normal)   Pulse 98   Temp 98.9 F (37.2 C) (Oral)   Ht 5\' 6"  (1.676 m)   Wt 170 lb 4 oz (77.2 kg)   SpO2 99%   BMI 27.48 kg/m  General: Awake, appears stated age Heart: RRR, no LE edema Lungs: CTAB, no rales, wheezes or rhonchi. No accessory muscle use Abdomen: Bowel sounds present, soft, nontender, nondistended Psych: Age appropriate judgment and insight, flat affect  Assessment and Plan: GAD (generalized anxiety disorder) - Plan: mirtazapine (REMERON) 7.5 MG tablet  Rash - Plan: triamcinolone (KENALOG) 0.025 % cream  Carpal tunnel syndrome of right wrist  Unintentional weight loss - Plan: CBC, Comprehensive metabolic panel, TSH  This is likely the cause of her weight loss.  Start above medication.  Recheck in 1 month. Refill as above. Offered second opinion for carpal tunnel syndrome.  She would like to think about it. Check above labs.  Encouraged her to complete the Cologuard kit. The patient voiced understanding and agreement to the plan.  Jilda Roche [place], DO 05/10/23  12:33 PM

## 2023-05-10 NOTE — Telephone Encounter (Signed)
Critical white count of 1.9

## 2023-05-11 ENCOUNTER — Other Ambulatory Visit (INDEPENDENT_AMBULATORY_CARE_PROVIDER_SITE_OTHER): Payer: BC Managed Care – PPO

## 2023-05-11 DIAGNOSIS — D72819 Decreased white blood cell count, unspecified: Secondary | ICD-10-CM | POA: Diagnosis not present

## 2023-05-11 DIAGNOSIS — D539 Nutritional anemia, unspecified: Secondary | ICD-10-CM

## 2023-05-11 DIAGNOSIS — D649 Anemia, unspecified: Secondary | ICD-10-CM | POA: Diagnosis not present

## 2023-05-11 DIAGNOSIS — R5383 Other fatigue: Secondary | ICD-10-CM | POA: Diagnosis not present

## 2023-05-11 DIAGNOSIS — R634 Abnormal weight loss: Secondary | ICD-10-CM | POA: Diagnosis not present

## 2023-05-11 LAB — B12 AND FOLATE PANEL
Folate: 11.7 ng/mL (ref >5.9)
Vitamin B-12: <50 pg/mL

## 2023-05-12 LAB — PATHOLOGIST SMEAR REVIEW

## 2023-05-24 ENCOUNTER — Telehealth: Payer: Self-pay | Admitting: Family Medicine

## 2023-05-24 NOTE — Telephone Encounter (Signed)
Called the patient left message to call back. She is due for her US Thyroid (follow-up)

## 2023-05-26 ENCOUNTER — Other Ambulatory Visit: Payer: Self-pay | Admitting: Family Medicine

## 2023-05-26 DIAGNOSIS — E041 Nontoxic single thyroid nodule: Secondary | ICD-10-CM

## 2023-05-26 NOTE — Telephone Encounter (Signed)
Called the patient to inform is due to US Thyroid She agreed to do Put in the order.

## 2023-05-30 ENCOUNTER — Telehealth (HOSPITAL_BASED_OUTPATIENT_CLINIC_OR_DEPARTMENT_OTHER): Payer: Self-pay

## 2023-06-20 ENCOUNTER — Telehealth: Payer: Self-pay | Admitting: Family Medicine

## 2023-06-20 ENCOUNTER — Encounter: Payer: Self-pay | Admitting: Family Medicine

## 2023-06-20 ENCOUNTER — Ambulatory Visit: Payer: BC Managed Care – PPO | Admitting: Family Medicine

## 2023-06-20 VITALS — BP 128/74 | HR 112 | Temp 98.0°F | Ht 66.0 in | Wt 170.0 lb

## 2023-06-20 DIAGNOSIS — J040 Acute laryngitis: Secondary | ICD-10-CM

## 2023-06-20 DIAGNOSIS — J069 Acute upper respiratory infection, unspecified: Secondary | ICD-10-CM | POA: Diagnosis not present

## 2023-06-20 MED ORDER — PROMETHAZINE-DM 6.25-15 MG/5ML PO SYRP
5.0000 mL | ORAL_SOLUTION | Freq: Four times a day (QID) | ORAL | 0 refills | Status: DC | PRN
Start: 2023-06-20 — End: 2023-10-09

## 2023-06-20 MED ORDER — AMOXICILLIN-POT CLAVULANATE 875-125 MG PO TABS
1.0000 | ORAL_TABLET | Freq: Two times a day (BID) | ORAL | 0 refills | Status: AC
Start: 2023-06-23 — End: 2023-06-30

## 2023-06-20 MED ORDER — FLUCONAZOLE 150 MG PO TABS
ORAL_TABLET | ORAL | 0 refills | Status: DC
Start: 2023-06-20 — End: 2023-10-09

## 2023-06-20 MED ORDER — METHYLPREDNISOLONE ACETATE 80 MG/ML IJ SUSP
80.0000 mg | Freq: Once | INTRAMUSCULAR | Status: AC
Start: 2023-06-20 — End: 2023-06-20
  Administered 2023-06-20: 80 mg via INTRAMUSCULAR

## 2023-06-20 NOTE — Progress Notes (Addendum)
Chief Complaint  Patient presents with   Chest Pain    [name] here for URI complaints.  Duration: 4 days  Associated symptoms: sore throat, chest tightness and cough Denies: sinus congestion, sinus pain, rhinorrhea, itchy watery eyes, ear pain, ear drainage, wheezing, shortness of breath, myalgia, and fevers Treatment to date: none Sick contacts: No  Past Medical History:  Diagnosis Date   Genital warts    GERD (gastroesophageal reflux disease)    Psoriasis     Objective BP 128/74 (BP Location: Left Arm, Patient Position: Sitting, Cuff Size: Normal)   Pulse (!) 112   Temp 98 F (36.7 C) (Oral)   Ht 5\' 6"  (1.676 m)   Wt 170 lb (77.1 kg)   SpO2 98%   BMI 27.44 kg/m  General: Awake, alert, appears stated age HEENT: AT, [place], ears patent b/l and TM's neg, nares patent w/o discharge, pharynx pink and without exudates, MMM, no sinus ttp b/l Neck: No masses or asymmetry Heart: RRR MSK: TTP over the anterior chest wall bilaterally Lungs: CTAB, no accessory muscle use Psych: Age appropriate judgment and insight, normal mood and affect  Viral URI with cough - Plan: promethazine-dextromethorphan (PROMETHAZINE-DM) 6.25-15 MG/5ML syrup, methylPREDNISolone acetate (DEPO-MEDROL) injection 80 mg  Laryngitis - Plan: amoxicillin-clavulanate (AUGMENTIN) 875-125 MG tablet, fluconazole (DIFLUCAN) 150 MG tablet, methylPREDNISolone acetate (DEPO-MEDROL) injection 80 mg  Depo-Medrol injection today, cough syrup as above.  Warned about drowsiness.  If no improvement in the next several days, she will take 7 days of Augmentin.  Fluconazole as needed.  Letter for work provided.  Continue to push fluids, practice good hand hygiene, cover mouth when coughing. F/u prn. If starting to experience fevers, shaking, or shortness of breath, seek immediate care. Pt voiced understanding and agreement to the plan.  April Roche [place], DO 06/20/23 4:39 PM

## 2023-06-20 NOTE — Patient Instructions (Addendum)
Continue to push fluids, practice good hand hygiene, and cover your mouth if you cough.  If you start having fevers, shaking or shortness of breath, seek immediate care.  Let us know if you need anything.  

## 2023-06-20 NOTE — Telephone Encounter (Signed)
Called and stated that she has been coughing up blood. The call was routed to triage. Please advise.

## 2023-06-20 NOTE — Telephone Encounter (Signed)
Initial Comment Caller states she needs to make appt. States she is coughing up yellow phlegm. Translation No Nurse Assessment Nurse: Charna Elizabeth, RN, Cathy Date/Time (Eastern Time): 06/20/2023 12:06:17 PM Confirm and document reason for call. If symptomatic, describe symptoms. ---April Ho states she developed a sore throat, cold, cough symptoms about 3 days ago. No known fever. No severe breathing difficulty or blueness around her lips. No chest pain. No sinus pain or muscle aches. Alert and responsive. Does the patient have any new or worsening symptoms? ---Yes Will a triage be completed? ---Yes Related visit to physician within the last 2 weeks? ---No Does the PT have any chronic conditions? (i.e. diabetes, asthma, this includes High risk factors for pregnancy, etc.) ---No Is the patient pregnant or possibly pregnant? (Ask all females between the ages of 9-55) ---No Is this a behavioral health or substance abuse call? ---No Guidelines Guideline Title Affirmed Question Affirmed Notes Nurse Date/Time (Eastern Time) Cough - Acute Productive SEVERE coughing spells (e.g., whooping sound after coughing, vomiting after coughing) Trumbull, RN, Cathy 06/20/2023 12:08:57 PM PLEASE NOTE: All timestamps contained within this report are represented as [place] Standard Time. CONFIDENTIALTY NOTICE: This fax transmission is intended only for the addressee. It contains information that is legally privileged, confidential or otherwise protected from use or disclosure. If you are not the intended recipient, you are strictly prohibited from reviewing, disclosing, copying using or disseminating any of this information or taking any action in reliance on or regarding this information. If you have received this fax in error, please notify us immediately by telephone so that we can arrange for its return to [place]. Phone: [phone], Toll-Free: [phone], Fax: [phone] Page: 2 of 2 Call Id:  [id number] Disp. Time Lamount Cohen Time) Disposition Final User 06/20/2023 12:11:41 PM See PCP within 24 Hours Yes Charna Elizabeth, RN, Lynden Ang Final Disposition 06/20/2023 12:11:41 PM See PCP within 24 Hours Yes Trumbull, RN, Lynden Ang Caller Disagree/Comply Comply Caller Understands Yes PreDisposition Call Doctor Care Advice Given Per Guideline SEE PCP WITHIN 24 HOURS: * IF OFFICE WILL BE OPEN: You need to be examined within the next 24 hours. Call your doctor (or NP/PA) when the office opens and make an appointment. COUGHING SPELLS: * Drink warm fluids. Inhale warm mist. This can help relax the airway and also loosen up phlegm. COUGH SYRUP WITH DEXTROMETHORPHAN: * Cough syrups containing the cough suppressant dextromethorphan may help decrease your cough. HUMIDIFIER: * If the air is dry, use a humidifier in the bedroom. CALL BACK IF: * Difficulty breathing occurs * You become worse CARE ADVICE given per Cough - Acute Productive (Adult) guideline. Referrals REFERRED TO PCP OFFICE

## 2023-06-20 NOTE — Telephone Encounter (Signed)
Will evaluate today.

## 2023-06-26 ENCOUNTER — Encounter (HOSPITAL_BASED_OUTPATIENT_CLINIC_OR_DEPARTMENT_OTHER): Payer: Self-pay | Admitting: Emergency Medicine

## 2023-06-26 ENCOUNTER — Emergency Department (HOSPITAL_BASED_OUTPATIENT_CLINIC_OR_DEPARTMENT_OTHER)
Admission: EM | Admit: 2023-06-26 | Discharge: 2023-06-27 | Disposition: A | Discharge status: Home / Self Care | Payer: BC Managed Care – PPO | Attending: Emergency Medicine | Admitting: Emergency Medicine

## 2023-06-26 ENCOUNTER — Other Ambulatory Visit: Payer: Self-pay

## 2023-06-26 DIAGNOSIS — R112 Nausea with vomiting, unspecified: Secondary | ICD-10-CM | POA: Diagnosis present

## 2023-06-26 DIAGNOSIS — R1010 Upper abdominal pain, unspecified: Secondary | ICD-10-CM | POA: Diagnosis not present

## 2023-06-26 LAB — COMPREHENSIVE METABOLIC PANEL
ALT: 23 U/L (ref 0–44)
AST: 25 U/L (ref 15–41)
Albumin: 4.6 g/dL (ref 3.5–5.0)
Alkaline Phosphatase: 81 U/L (ref 38–126)
Anion gap: 6 (ref 5–15)
BUN: 15 mg/dL (ref 6–20)
CO2: 25 mmol/L (ref 22–32)
Calcium: 8.9 mg/dL (ref 8.9–10.3)
Chloride: 106 mmol/L (ref 98–111)
Creatinine, Ser: 0.6 mg/dL (ref 0.44–1.00)
GFR, Estimated: >60 mL/min (ref >60)
Glucose, Bld: 110 mg/dL — ABNORMAL HIGH (ref 70–99)
Potassium: 4 mmol/L (ref 3.5–5.1)
Sodium: 137 mmol/L (ref 135–145)
Total Bilirubin: 1.2 mg/dL — ABNORMAL HIGH (ref <1.2)
Total Protein: 7.2 g/dL (ref 6.5–8.1)

## 2023-06-26 LAB — CBC WITH DIFFERENTIAL/PLATELET
Abs Immature Granulocytes: 0.01 10*3/uL (ref 0.00–0.07)
Basophils Absolute: 0 10*3/uL (ref 0.0–0.1)
Basophils Relative: 0 %
Eosinophils Absolute: 0.1 10*3/uL (ref 0.0–0.5)
Eosinophils Relative: 3 %
HCT: 22.2 % — ABNORMAL LOW (ref 36.0–46.0)
Hemoglobin: 7.9 g/dL — ABNORMAL LOW (ref 12.0–15.0)
Immature Granulocytes: 0 %
Lymphocytes Relative: 43 %
Lymphs Abs: 1 10*3/uL (ref 0.7–4.0)
MCH: 45.7 pg — ABNORMAL HIGH (ref 26.0–34.0)
MCHC: 35.6 g/dL (ref 30.0–36.0)
MCV: 128.3 fL — ABNORMAL HIGH (ref 80.0–100.0)
Monocytes Absolute: 0.1 10*3/uL (ref 0.1–1.0)
Monocytes Relative: 4 %
Neutro Abs: 1.2 10*3/uL — ABNORMAL LOW (ref 1.7–7.7)
Neutrophils Relative %: 50 %
Platelets: 223 10*3/uL (ref 150–400)
RBC: 1.73 MIL/uL — ABNORMAL LOW (ref 3.87–5.11)
RDW: 16.6 % — ABNORMAL HIGH (ref 11.5–15.5)
Smear Review: NORMAL
WBC: 2.4 10*3/uL — ABNORMAL LOW (ref 4.0–10.5)
nRBC: 0.8 % — ABNORMAL HIGH (ref 0.0–0.2)

## 2023-06-26 LAB — LIPASE, BLOOD: Lipase: 25 U/L (ref 11–51)

## 2023-06-26 NOTE — ED Triage Notes (Signed)
Patient reports vomiting since the 12th when starting new medications, mirtazapine, amox-clav, and promethazine.

## 2023-06-27 ENCOUNTER — Emergency Department (HOSPITAL_BASED_OUTPATIENT_CLINIC_OR_DEPARTMENT_OTHER): Payer: BC Managed Care – PPO

## 2023-06-27 ENCOUNTER — Telehealth: Payer: Self-pay | Admitting: Family Medicine

## 2023-06-27 LAB — PREGNANCY, URINE: Preg Test, Ur: NEGATIVE

## 2023-06-27 LAB — URINALYSIS, ROUTINE W REFLEX MICROSCOPIC
Bilirubin Urine: NEGATIVE
Glucose, UA: NEGATIVE mg/dL
Ketones, ur: NEGATIVE mg/dL
Leukocytes,Ua: NEGATIVE
Nitrite: NEGATIVE
Protein, ur: NEGATIVE mg/dL
Specific Gravity, Urine: >=1.03 (ref 1.005–1.030)
pH: 6 (ref 5.0–8.0)

## 2023-06-27 LAB — RETICULOCYTES
Immature Retic Fract: 27.6 % — ABNORMAL HIGH (ref 2.3–15.9)
RBC.: 1.73 MIL/uL — ABNORMAL LOW (ref 3.87–5.11)
Retic Count, Absolute: 29.4 10*3/uL (ref 19.0–186.0)
Retic Ct Pct: 1.7 % (ref 0.4–3.1)

## 2023-06-27 LAB — FOLATE: Folate: 11.7 ng/mL (ref >5.9)

## 2023-06-27 LAB — FERRITIN: Ferritin: 205 ng/mL (ref 11–307)

## 2023-06-27 LAB — URINALYSIS, MICROSCOPIC (REFLEX)

## 2023-06-27 LAB — IRON AND TIBC
Iron: 87 ug/dL (ref 28–170)
Saturation Ratios: 32 % — ABNORMAL HIGH (ref 10.4–31.8)
TIBC: 269 ug/dL (ref 250–450)
UIBC: 182 ug/dL

## 2023-06-27 LAB — VITAMIN B12: Vitamin B-12: <50 pg/mL — ABNORMAL LOW (ref 180–914)

## 2023-06-27 LAB — OCCULT BLOOD X 1 CARD TO LAB, STOOL: Fecal Occult Bld: NEGATIVE

## 2023-06-27 MED ORDER — ONDANSETRON HCL 4 MG/2ML IJ SOLN
4.0000 mg | Freq: Once | INTRAMUSCULAR | Status: AC
  Administered 2023-06-27: 4 mg via INTRAVENOUS
  Filled 2023-06-27: qty 2

## 2023-06-27 MED ORDER — SODIUM CHLORIDE 0.9 % IV BOLUS
1000.0000 mL | Freq: Once | INTRAVENOUS | Status: AC
Start: 2023-06-27 — End: 2023-06-27
  Administered 2023-06-27: 1000 mL via INTRAVENOUS

## 2023-06-27 MED ORDER — IOHEXOL 300 MG/ML  SOLN
100.0000 mL | Freq: Once | INTRAMUSCULAR | Status: AC | PRN
  Administered 2023-06-27: 100 mL via INTRAVENOUS

## 2023-06-27 MED ORDER — ONDANSETRON 4 MG PO TBDP: 4.0000 mg | ORAL_TABLET | Freq: Three times a day (TID) | ORAL | 0 refills | Status: DC | PRN

## 2023-06-27 NOTE — ED Notes (Signed)
Pt able to tolerate fluids per EDP order

## 2023-06-27 NOTE — ED Provider Notes (Signed)
[place] EMERGENCY DEPARTMENT AT [facility] Provider Note   CSN: [id number] Arrival date & time: 06/26/23  2244     History  No chief complaint on file.   Nakiesha Dahman is a 47 y.o. female.  Patient with a history of GERD presenting with intermittent nausea and vomiting for the past 6 days.  States she was seen by her PCP in November 12 and started on Augmentin and promethazine cough syrup URI type infection.  She states she vomits every time she tries to take the Augmentin.  She has been breaking the pills in half and trying to take them slowly but she still vomits after taking these pills.  Patient vomits about twice a day.  Emesis is nonbloody and nonbilious.  No diarrhea.  No black or bloody stools.  Some upper abdominal pain.  No fever, chills, pain with urination or blood in the urine.  She states when she lies down she still coughs and feels nauseous.  States she is still able to eat and drink when she is not taking the medications but still having nausea.  No vaginal bleeding or discharge.  Only surgery is a hysterectomy.  The history is provided by the patient.       Home Medications Prior to Admission medications   Medication Sig Start Date End Date Taking? Authorizing Provider  alum & mag hydroxide-simeth (MAALOX MAX) 400-400-40 MG/5ML suspension Take 10 mLs by mouth every 6 (six) hours as needed for indigestion. 04/16/23   Henderly, Britni A, PA-C  amoxicillin-clavulanate (AUGMENTIN) 875-125 MG tablet Take 1 tablet by mouth 2 (two) times daily for 7 days. 06/23/23 06/30/23  Sharlene Dory, DO  Emollient (CETAPHIL) cream Apply topically as needed. 10/01/22   Curatolo, Adam, DO  EPINEPHrine 0.3 mg/0.3 mL IJ SOAJ injection Inject 0.3 mLs (0.3 mg total) into the muscle as needed for anaphylaxis. 01/29/19   Sharlene Dory, DO  fluconazole (DIFLUCAN) 150 MG tablet Take 1 tab, repeat in 72 hours if no improvement. 06/20/23   Sharlene Dory, DO   fluticasone (FLONASE) 50 MCG/ACT nasal spray Place 2 sprays into both nostrils daily. 08/22/22   Sharlene Dory, DO  levocetirizine (XYZAL) 5 MG tablet TAKE 1 TABLET(5 MG) BY MOUTH EVERY EVENING 08/22/22   Wendling, Jilda Roche, DO  mirtazapine (REMERON) 7.5 MG tablet Take 1 tablet (7.5 mg total) by mouth at bedtime. 05/10/23   Sharlene Dory, DO  pantoprazole (PROTONIX) 20 MG tablet Take 1 tablet (20 mg total) by mouth daily. 04/19/23   Sharlene Dory, DO  polyethylene glycol (MIRALAX) 17 g packet Take 17 g by mouth daily. 06/29/22   Sharlene Dory, DO  promethazine-dextromethorphan (PROMETHAZINE-DM) 6.25-15 MG/5ML syrup Take 5 mLs by mouth 4 (four) times daily as needed for cough. 06/20/23   Sharlene Dory, DO  triamcinolone (KENALOG) 0.025 % cream Apply 1 Application topically 2 (two) times daily. This is for use on your face only. 05/10/23   Sharlene Dory, DO  triamcinolone cream (KENALOG) 0.1 % Apply 1 Application topically 2 (two) times daily. 05/10/23   Sharlene Dory, DO  valACYclovir (VALTREX) 500 MG tablet Take 1 tablet (500 mg total) by mouth daily. 11/02/22   Sharlene Dory, DO      Allergies    Cat hair extract, Dog epithelium, and Dust mite extract    Review of Systems   Review of Systems  Constitutional:  Positive for activity change and appetite change. Negative for  fever.  HENT:  Negative for congestion and rhinorrhea.   Respiratory:  Negative for cough, chest tightness and shortness of breath.   Cardiovascular:  Negative for chest pain.  Gastrointestinal:  Positive for abdominal pain, nausea and vomiting. Negative for constipation and diarrhea.  Genitourinary:  Negative for dysuria and hematuria.  Musculoskeletal:  Negative for arthralgias and myalgias.  Skin:  Negative for rash.  Neurological:  Negative for weakness and headaches.   all other systems are negative except as noted in the HPI and PMH.     Physical Exam Updated Vital Signs BP 118/75 (BP Location: Left Arm)   Pulse 87   Temp 98 F (36.7 C)   Resp 18   Ht 5\' 6"  (1.676 m)   Wt 74.3 kg   SpO2 100%   BMI 26.44 kg/m  Physical Exam Vitals and nursing note reviewed.  Constitutional:      General: She is not in acute distress.    Appearance: She is well-developed.     Comments: Pale appearing  HENT:     Head: Normocephalic and atraumatic.     Mouth/Throat:     Pharynx: No oropharyngeal exudate.  Eyes:     Conjunctiva/sclera: Conjunctivae normal.     Pupils: Pupils are equal, round, and reactive to light.     Comments: Pale conjunctival  Neck:     Comments: No meningismus. Cardiovascular:     Rate and Rhythm: Normal rate and regular rhythm.     Heart sounds: Normal heart sounds. No murmur heard. Pulmonary:     Effort: Pulmonary effort is normal. No respiratory distress.     Breath sounds: Normal breath sounds.  Abdominal:     Palpations: Abdomen is soft.     Tenderness: There is abdominal tenderness. There is no guarding or rebound.     Comments: Epigastric tenderness, no guarding or rebound  Genitourinary:    Comments: Chaperone present, Arts administrator.  No external hemorrhoids.  No gross blood. Musculoskeletal:        General: No tenderness. Normal range of motion.     Cervical back: Normal range of motion and neck supple.  Skin:    General: Skin is warm.  Neurological:     Mental Status: She is alert and oriented to person, place, and time.     Cranial Nerves: No cranial nerve deficit.     Motor: No abnormal muscle tone.     Coordination: Coordination normal.     Comments:  5/5 strength throughout. CN 2-12 intact.Equal grip strength.   Psychiatric:        Behavior: Behavior normal.     ED Results / Procedures / Treatments   Labs (all labs ordered are listed, but only abnormal results are displayed) Labs Reviewed  CBC WITH DIFFERENTIAL/PLATELET - Abnormal; Notable for the following components:       Result Value   WBC 2.4 (*)    RBC 1.73 (*)    Hemoglobin 7.9 (*)    HCT 22.2 (*)    MCV 128.3 (*)    MCH 45.7 (*)    RDW 16.6 (*)    nRBC 0.8 (*)    Neutro Abs 1.2 (*)    All other components within normal limits  COMPREHENSIVE METABOLIC PANEL - Abnormal; Notable for the following components:   Glucose, Bld 110 (*)    Total Bilirubin 1.2 (*)    All other components within normal limits  URINALYSIS, ROUTINE W REFLEX MICROSCOPIC - Abnormal; Notable for the following  components:   Hgb urine dipstick TRACE (*)    All other components within normal limits  RETICULOCYTES - Abnormal; Notable for the following components:   RBC. 1.73 (*)    Immature Retic Fract 27.6 (*)    All other components within normal limits  URINALYSIS, MICROSCOPIC (REFLEX) - Abnormal; Notable for the following components:   Bacteria, UA RARE (*)    All other components within normal limits  LIPASE, BLOOD  PREGNANCY, URINE  OCCULT BLOOD X 1 CARD TO LAB, STOOL  VITAMIN B12  FOLATE  IRON AND TIBC  FERRITIN    EKG None  Radiology CT ABDOMEN PELVIS W CONTRAST  Result Date: 06/27/2023 CLINICAL DATA:  Acute abdominal pain EXAM: CT ABDOMEN AND PELVIS WITH CONTRAST TECHNIQUE: Multidetector CT imaging of the abdomen and pelvis was performed using the standard protocol following bolus administration of intravenous contrast. RADIATION DOSE REDUCTION: This exam was performed according to the departmental dose-optimization program which includes automated exposure control, adjustment of the mA and/or kV according to patient size and/or use of iterative reconstruction technique. CONTRAST:  OMNIPAQUE IOHEXOL 300 MG/ML  SOLN COMPARISON:  None Available. FINDINGS: Lower chest: No acute abnormality. Hepatobiliary: No focal liver abnormality is seen. No gallstones, gallbladder wall thickening, or biliary dilatation. Pancreas: Unremarkable. No pancreatic ductal dilatation or surrounding inflammatory changes. Spleen: Normal  in size without focal abnormality. Adrenals/Urinary Tract: Adrenal glands are within normal limits. Kidneys demonstrate a normal enhancement pattern. No renal calculi or obstructive changes are noted. The bladder is decompressed. Stomach/Bowel: No obstructive or inflammatory changes of the colon are seen. The appendix is within normal limits. Small bowel and stomach are unremarkable. Vascular/Lymphatic: No significant vascular findings are present. No enlarged abdominal or pelvic lymph nodes. Reproductive: Uterus and bilateral adnexa are unremarkable. Other: No abdominal wall hernia or abnormality. No abdominopelvic ascites. Musculoskeletal: No acute or significant osseous findings. IMPRESSION: No acute abnormality noted Electronically Signed   By: Alcide Clever M.D.   On: 06/27/2023 02:21    Procedures Procedures    Medications Ordered in ED Medications - No data to display  ED Course/ Medical Decision Making/ A&P                                 Medical Decision Making Amount and/or Complexity of Data Reviewed Labs: ordered. Decision-making details documented in ED Course. Radiology: ordered and independent interpretation performed. Decision-making details documented in ED Course. ECG/medicine tests: ordered and independent interpretation performed. Decision-making details documented in ED Course.  Risk Prescription drug management.   Episode of nausea and vomiting in the setting of taking Augmentin.  Stable vital signs.  Abdomen soft without peritoneal signs.  Labs show leukopenia which appears to be chronic.  Hemoglobin has drifted down since February and is now 8.  It was 12 in February.  She denies any vaginal bleeding, dark or bloody stools.  hCG is negative.  Urinalysis is negative.  Labs show stable LFTs and lipase.  CT scan without acute pathology.  No bowel obstruction seen.  Nausea and vomiting likely secondary to taking her amoxicillin.  Patient made aware of her anemia will  refer to hematology for further evaluation.  MCV elevated.  Concern for B12 or folate deficiency.  Discussed with patient she can likely stop taking her Augmentin.  She feels improved from her URI standpoint.  Tolerating p.o. in the ED.  Patient made aware of her anemia.  Need  for hematology and PCP follow-up. Discussed she can check the results of her anemia panel online later and start iron supplementation if her iron stores are low.  Start with a clear liquid diet and advance slowly as tolerated.  Return to the ED with new or worsening symptoms       Final Clinical Impression(s) / ED Diagnoses Final diagnoses:  Nausea and vomiting, unspecified vomiting type    Rx / DC Orders ED Discharge Orders     None         Artie Mcintyre, Jeannett Senior, MD 06/27/23 [phone]

## 2023-06-27 NOTE — ED Notes (Signed)
Fluid challenge started per EDP order

## 2023-06-27 NOTE — Telephone Encounter (Signed)
Iron levels normal, B12 low. Please schedule her for weekly B12 injections, let's recheck her labs in 4 weeks. B12, CBC, IF antibody.

## 2023-06-27 NOTE — Discharge Instructions (Addendum)
Stop taking the Augmentin.  Take the nausea medication as prescribed.  Start with a clear liquid diet and advance slowly as tolerated.  Your blood counts are low today and you may have B12 deficiency.  Follow-up with your primary doctor and hematologist regarding that.  Return to the ED with new or worsening symptoms.

## 2023-06-28 ENCOUNTER — Ambulatory Visit (INDEPENDENT_AMBULATORY_CARE_PROVIDER_SITE_OTHER): Payer: BC Managed Care – PPO

## 2023-06-28 DIAGNOSIS — E538 Deficiency of other specified B group vitamins: Secondary | ICD-10-CM

## 2023-06-28 MED ORDER — CYANOCOBALAMIN 1000 MCG/ML IJ SOLN
1000.0000 ug | Freq: Once | INTRAMUSCULAR | Status: AC
  Administered 2023-06-28: 1000 ug via INTRAMUSCULAR

## 2023-06-28 NOTE — Progress Notes (Signed)
Pt here for monthly B12 injection per Wendling  B12 given IM, and pt tolerated injection well.  Next B12 injection scheduled for 11/27

## 2023-06-28 NOTE — Telephone Encounter (Signed)
Called pt lvm needing to discuss labs and schedule NV appt to B-12 weekly shots and lab appt to recheck labs in 4 weeks.

## 2023-07-04 ENCOUNTER — Encounter: Payer: Self-pay | Admitting: Hematology & Oncology

## 2023-07-05 ENCOUNTER — Ambulatory Visit (INDEPENDENT_AMBULATORY_CARE_PROVIDER_SITE_OTHER): Payer: BC Managed Care – PPO

## 2023-07-05 DIAGNOSIS — E538 Deficiency of other specified B group vitamins: Secondary | ICD-10-CM

## 2023-07-05 MED ORDER — CYANOCOBALAMIN 1000 MCG/ML IJ SOLN
1000.0000 ug | Freq: Once | INTRAMUSCULAR | Status: AC
  Administered 2023-07-05: 1000 ug via INTRAMUSCULAR

## 2023-07-05 NOTE — Progress Notes (Signed)
Pt here for monthly B12 injection per PCP  B12 given IM R deltoid, and pt tolerated injection well.  Next B12 injection scheduled for 07/12/2023.

## 2023-07-12 ENCOUNTER — Ambulatory Visit (INDEPENDENT_AMBULATORY_CARE_PROVIDER_SITE_OTHER): Payer: BC Managed Care – PPO

## 2023-07-12 DIAGNOSIS — E538 Deficiency of other specified B group vitamins: Secondary | ICD-10-CM | POA: Diagnosis not present

## 2023-07-12 MED ORDER — CYANOCOBALAMIN 1000 MCG/ML IJ SOLN
1000.0000 ug | Freq: Once | INTRAMUSCULAR | Status: AC
  Administered 2023-07-12: 1000 ug via INTRAMUSCULAR

## 2023-07-12 NOTE — Progress Notes (Signed)
Pt here for monthly B12 injection per Wendling  B12 given IM, and pt tolerated injection well.  Next B12 injection scheduled for 12/11

## 2023-07-19 ENCOUNTER — Ambulatory Visit (INDEPENDENT_AMBULATORY_CARE_PROVIDER_SITE_OTHER): Payer: BC Managed Care – PPO | Admitting: *Deleted

## 2023-07-19 ENCOUNTER — Other Ambulatory Visit (INDEPENDENT_AMBULATORY_CARE_PROVIDER_SITE_OTHER): Payer: BC Managed Care – PPO

## 2023-07-19 ENCOUNTER — Other Ambulatory Visit: Payer: Self-pay | Admitting: *Deleted

## 2023-07-19 DIAGNOSIS — E538 Deficiency of other specified B group vitamins: Secondary | ICD-10-CM

## 2023-07-19 DIAGNOSIS — D539 Nutritional anemia, unspecified: Secondary | ICD-10-CM

## 2023-07-19 LAB — CBC
HCT: 37.1 % (ref 36.0–46.0)
Hemoglobin: 11.9 g/dL — ABNORMAL LOW (ref 12.0–15.0)
MCHC: 32.1 g/dL (ref 30.0–36.0)
MCV: 115.9 fL — ABNORMAL HIGH (ref 78.0–100.0)
Platelets: 253 10*3/uL (ref 150.0–400.0)
RBC: 3.2 Mil/uL — ABNORMAL LOW (ref 3.87–5.11)
RDW: 23.1 % — ABNORMAL HIGH (ref 11.5–15.5)
WBC: 3.7 10*3/uL — ABNORMAL LOW (ref 4.0–10.5)

## 2023-07-19 LAB — VITAMIN B12: Vitamin B-12: 480 pg/mL (ref 211–911)

## 2023-07-19 MED ORDER — CYANOCOBALAMIN 1000 MCG/ML IJ SOLN
1000.0000 ug | Freq: Once | INTRAMUSCULAR | Status: AC
  Administered 2023-07-19: 1000 ug via INTRAMUSCULAR

## 2023-07-19 NOTE — Progress Notes (Signed)
Patient here for last weekly b12 injection per physicians order.  Injection given in right deltoid and patient tolerated well.  Labs were also checked today (CBC, B12, IF anitbody).

## 2023-07-28 ENCOUNTER — Telehealth: Payer: Self-pay

## 2023-07-28 LAB — INTRINSIC FACTOR ANTIBODIES: Intrinsic Factor: POSITIVE — AB

## 2023-07-28 NOTE — Telephone Encounter (Signed)
Told pt we will let her know what the providers say  when she comes on her 08/01/23.  Per Dr. Carmelia Roller Monthly if all goes well, maybe every 2-3 weeks, just depends if she can maintain adequate levels.

## 2023-08-01 ENCOUNTER — Ambulatory Visit (INDEPENDENT_AMBULATORY_CARE_PROVIDER_SITE_OTHER): Payer: BC Managed Care – PPO

## 2023-08-01 DIAGNOSIS — E538 Deficiency of other specified B group vitamins: Secondary | ICD-10-CM

## 2023-08-01 MED ORDER — CYANOCOBALAMIN 1000 MCG/ML IJ SOLN
1000.0000 ug | Freq: Once | INTRAMUSCULAR | Status: AC
  Administered 2023-08-01: 1000 ug via INTRAMUSCULAR

## 2023-08-01 NOTE — Progress Notes (Signed)
Pt here for bi weekly B12 injection  Jilda Roche [place], DO 07/19/2023  2:56 PM EST    Please let pt know her numbers are much better. We can taper down to every other week for 2 weeks and then monthly shots.   B12 given IM right deltoid, and pt tolerated injection well.  Next B12 injection scheduled for - 1/10 @ 915am. Next B12 will be monthly

## 2023-08-07 ENCOUNTER — Other Ambulatory Visit: Payer: Self-pay | Admitting: Family Medicine

## 2023-08-07 DIAGNOSIS — R21 Rash and other nonspecific skin eruption: Secondary | ICD-10-CM

## 2023-08-07 MED ORDER — TRIAMCINOLONE ACETONIDE 0.1 % EX CREA: 1.0000 | TOPICAL_CREAM | Freq: Two times a day (BID) | CUTANEOUS | 0 refills | Status: DC

## 2023-08-07 MED ORDER — TRIAMCINOLONE ACETONIDE 0.025 % EX CREA: 1.0000 | TOPICAL_CREAM | Freq: Two times a day (BID) | CUTANEOUS | 0 refills | Status: DC

## 2023-08-07 NOTE — Telephone Encounter (Signed)
Copied from CRM [id number]. Topic: Clinical - Medication Refill >> Aug 07, 2023 12:19 PM Elizebeth Brooking wrote: Most Recent Primary Care Visit:  Provider: Suezanne Jacquet  Department: [facility]  Visit Type: NURSE VISIT  Date: 08/01/2023  Medication: ***  Has the patient contacted their pharmacy?  (Agent: If no, request that the patient contact the pharmacy for the refill. If patient does not wish to contact the pharmacy document the reason why and proceed with request.) (Agent: If yes, when and what did the pharmacy advise?)  Is this the correct pharmacy for this prescription?  If no, delete pharmacy and type the correct one.  This is the patient's preferred pharmacy:  [id number], [place] - [address] AT [facility] [address] [place] [address] Phone: [phone] Fax: [phone]  [id number] - [place], [place] - [address] [place] [address] Phone: [phone] Fax: [phone]   Has the prescription been filled recently?   Is the patient out of the medication?   Has the patient been seen for an appointment in the last year OR does the patient have an upcoming appointment?   Can we respond through MyChart?   Agent: Please be advised that Rx refills may take up to 3 business days. We ask that you follow-up with your pharmacy.

## 2023-08-07 NOTE — Addendum Note (Signed)
Addended by: Radene Gunning on: 08/07/2023 12:31 PM   Modules accepted: Orders

## 2023-08-18 ENCOUNTER — Ambulatory Visit (INDEPENDENT_AMBULATORY_CARE_PROVIDER_SITE_OTHER): Payer: BC Managed Care – PPO

## 2023-08-18 DIAGNOSIS — E538 Deficiency of other specified B group vitamins: Secondary | ICD-10-CM

## 2023-08-18 MED ORDER — CYANOCOBALAMIN 1000 MCG/ML IJ SOLN
1000.0000 ug | Freq: Once | INTRAMUSCULAR | Status: AC
  Administered 2023-08-18: 1000 ug via INTRAMUSCULAR

## 2023-08-18 NOTE — Progress Notes (Addendum)
 April Ho is a 48 y.o. female presents to the office today for last Bi-Weekly B12 injection, per physician's orders. Original order: 07/19/23: Please let pt know her numbers are much better. We can taper down to every other week for 2 weeks and then monthly shots. Cyanocobalamin  1000 mg/ml IM was administered R Deltoid today. Patient tolerated injection. Patient due for follow up labs/provider appt: No. Date due: no Patient next injection due: 1 month for 1st Monthly B12 injection, appt made Yes: 09/19/23 10:30AM.  Creft, Candelaria CROME

## 2023-09-19 ENCOUNTER — Ambulatory Visit: Payer: BC Managed Care – PPO

## 2023-09-20 ENCOUNTER — Ambulatory Visit: Payer: BC Managed Care – PPO

## 2023-09-22 ENCOUNTER — Ambulatory Visit (INDEPENDENT_AMBULATORY_CARE_PROVIDER_SITE_OTHER): Payer: BC Managed Care – PPO | Admitting: *Deleted

## 2023-09-22 DIAGNOSIS — E538 Deficiency of other specified B group vitamins: Secondary | ICD-10-CM

## 2023-09-22 MED ORDER — CYANOCOBALAMIN 1000 MCG/ML IJ SOLN
1000.0000 ug | Freq: Once | INTRAMUSCULAR | Status: AC
  Administered 2023-09-22: 1000 ug via INTRAMUSCULAR

## 2023-09-22 NOTE — Progress Notes (Signed)
April Ho is a 48 y.o. female presents to the office today for last Bi-Weekly B12 injection, per physician's orders. Original order: 07/19/23: "Please let pt know her numbers are much better. We can taper down to every other week for 2 weeks and then monthly shots." Cyanocobalamin 1000 mg/ml IM was administered R Deltoid today. Patient tolerated injection. Patient due for follow up labs/provider appt: No. Date due: no Patient next injection 10/20/23

## 2023-10-03 ENCOUNTER — Encounter (HOSPITAL_BASED_OUTPATIENT_CLINIC_OR_DEPARTMENT_OTHER): Payer: Self-pay | Admitting: Emergency Medicine

## 2023-10-03 ENCOUNTER — Other Ambulatory Visit: Payer: Self-pay

## 2023-10-03 ENCOUNTER — Emergency Department (HOSPITAL_BASED_OUTPATIENT_CLINIC_OR_DEPARTMENT_OTHER)
Admission: RE | Admit: 2023-10-03 | Discharge: 2023-10-03 | Disposition: A | Discharge status: Home / Self Care | Payer: BC Managed Care – PPO | Attending: Emergency Medicine | Admitting: Emergency Medicine

## 2023-10-03 DIAGNOSIS — J069 Acute upper respiratory infection, unspecified: Secondary | ICD-10-CM

## 2023-10-03 DIAGNOSIS — R059 Cough, unspecified: Secondary | ICD-10-CM | POA: Diagnosis present

## 2023-10-03 DIAGNOSIS — J101 Influenza due to other identified influenza virus with other respiratory manifestations: Secondary | ICD-10-CM | POA: Insufficient documentation

## 2023-10-03 LAB — GROUP A STREP BY PCR: Group A Strep by PCR: NOT DETECTED

## 2023-10-03 LAB — RESP PANEL BY RT-PCR (RSV, FLU A&B, COVID)  RVPGX2
Influenza A by PCR: POSITIVE — AB
Influenza B by PCR: NEGATIVE
Resp Syncytial Virus by PCR: NEGATIVE
SARS Coronavirus 2 by RT PCR: NEGATIVE

## 2023-10-03 MED ORDER — ALBUTEROL SULFATE HFA 108 (90 BASE) MCG/ACT IN AERS: 1.0000 | INHALATION_SPRAY | Freq: Four times a day (QID) | RESPIRATORY_TRACT | 0 refills | Status: AC | PRN

## 2023-10-03 MED ORDER — BENZONATATE 100 MG PO CAPS: 100.0000 mg | ORAL_CAPSULE | Freq: Three times a day (TID) | ORAL | 0 refills | Status: DC

## 2023-10-03 MED ORDER — ACETAMINOPHEN 325 MG PO TABS
650.0000 mg | ORAL_TABLET | Freq: Once | ORAL | Status: AC
  Administered 2023-10-03: 650 mg via ORAL
  Filled 2023-10-03: qty 2

## 2023-10-03 NOTE — ED Provider Notes (Signed)
 [place] [facility] [facility] Provider Note   CSN: [id number] Arrival date & time: 10/03/23  1507     History  Chief Complaint  Patient presents with   Cough    April Ho is a 48 y.o. female with history of GERD, psoriasis, who presents the emergency department complaining of cough, nasal congestion, and sore throat.  Patient states symptoms started yesterday, she was taking some cold and flu medicine with minimal relief.   Cough Associated symptoms: fever and sore throat        Home Medications Prior to Admission medications   Medication Sig Start Date End Date Taking? Authorizing Provider  albuterol (VENTOLIN HFA) 108 (90 Base) MCG/ACT inhaler Inhale 1-2 puffs into the lungs every 6 (six) hours as needed for wheezing or shortness of breath. 10/03/23  Yes Moroni Nester T, PA-C  benzonatate (TESSALON) 100 MG capsule Take 1 capsule (100 mg total) by mouth every 8 (eight) hours. 10/03/23  Yes Swayze Kozuch T, PA-C  alum & mag hydroxide-simeth (MAALOX MAX) 400-400-40 MG/5ML suspension Take 10 mLs by mouth every 6 (six) hours as needed for indigestion. 04/16/23   Henderly, Britni A, PA-C  Emollient (CETAPHIL) cream Apply topically as needed. 10/01/22   Curatolo, Adam, DO  EPINEPHrine 0.3 mg/0.3 mL IJ SOAJ injection Inject 0.3 mLs (0.3 mg total) into the muscle as needed for anaphylaxis. 01/29/19   Sharlene Dory, DO  fluconazole (DIFLUCAN) 150 MG tablet Take 1 tab, repeat in 72 hours if no improvement. 06/20/23   Sharlene Dory, DO  fluticasone (FLONASE) 50 MCG/ACT nasal spray Place 2 sprays into both nostrils daily. 08/22/22   Sharlene Dory, DO  levocetirizine (XYZAL) 5 MG tablet TAKE 1 TABLET(5 MG) BY MOUTH EVERY EVENING 08/22/22   Wendling, Jilda Roche, DO  mirtazapine (REMERON) 7.5 MG tablet Take 1 tablet (7.5 mg total) by mouth at bedtime. 05/10/23   Sharlene Dory, DO  ondansetron (ZOFRAN-ODT) 4 MG disintegrating  tablet Take 1 tablet (4 mg total) by mouth every 8 (eight) hours as needed. 06/27/23   Rancour, Jeannett Senior, MD  pantoprazole (PROTONIX) 20 MG tablet Take 1 tablet (20 mg total) by mouth daily. 04/19/23   Sharlene Dory, DO  polyethylene glycol (MIRALAX) 17 g packet Take 17 g by mouth daily. 06/29/22   Sharlene Dory, DO  promethazine-dextromethorphan (PROMETHAZINE-DM) 6.25-15 MG/5ML syrup Take 5 mLs by mouth 4 (four) times daily as needed for cough. 06/20/23   Sharlene Dory, DO  triamcinolone (KENALOG) 0.025 % cream Apply 1 Application topically 2 (two) times daily. This is for use on your face only. 08/07/23   Sharlene Dory, DO  triamcinolone cream (KENALOG) 0.1 % Apply 1 Application topically 2 (two) times daily. 08/07/23   Sharlene Dory, DO  valACYclovir (VALTREX) 500 MG tablet Take 1 tablet (500 mg total) by mouth daily. 11/02/22   Sharlene Dory, DO      Allergies    Cat dander, Dog epithelium, and Dust mite extract    Review of Systems   Review of Systems  Constitutional:  Positive for fatigue and fever.  HENT:  Positive for congestion and sore throat.   Respiratory:  Positive for cough.   All other systems reviewed and are negative.   Physical Exam Updated Vital Signs BP (!) 134/91 (BP Location: Left Arm)   Pulse (!) 106   Temp (!) 101 F (38.3 C)   Resp 18   SpO2 100%  Physical Exam  Vitals and nursing note reviewed.  Constitutional:      Appearance: Normal appearance.  HENT:     Head: Normocephalic and atraumatic.  Eyes:     Conjunctiva/sclera: Conjunctivae normal.  Cardiovascular:     Rate and Rhythm: Normal rate and regular rhythm.  Pulmonary:     Effort: Pulmonary effort is normal. No respiratory distress.     Breath sounds: Normal breath sounds.  Abdominal:     General: There is no distension.     Palpations: Abdomen is soft.     Tenderness: There is no abdominal tenderness.  Skin:    General: Skin is warm  and dry.  Neurological:     General: No focal deficit present.     Mental Status: She is alert.     ED Results / Procedures / Treatments   Labs (all labs ordered are listed, but only abnormal results are displayed) Labs Reviewed  RESP PANEL BY RT-PCR (RSV, FLU A&B, COVID)  RVPGX2 - Abnormal; Notable for the following components:      Result Value   Influenza A by PCR POSITIVE (*)    All other components within normal limits  GROUP A STREP BY PCR    EKG None  Radiology No results found.  Procedures Procedures    Medications Ordered in ED Medications  acetaminophen (TYLENOL) tablet 650 mg (650 mg Oral Given 10/03/23 1527)    ED Course/ Medical Decision Making/ A&P                                 Medical Decision Making Risk OTC drugs. Prescription drug management.   This patient is a 48 y.o. female who presents to the ED for concern of cough, nasal congestion, sore throat since yesterday.   Differential diagnoses prior to evaluation: The emergent differential diagnosis includes, but is not limited to,  upper respiratory infection, lower respiratory infection, allergies, asthma, esophageal foreign body, interstitial lung disease, viral illness, sepsis. This is not an exhaustive differential.   Past Medical History / Co-morbidities / Additional history: Chart reviewed. Pertinent results include: GERD, psoriasis  Physical Exam: Physical exam performed. The pertinent findings include: Febrile to 101F, tachycardic to 106.  Otherwise stable vital signs.  No acute distress.  Heart regular rate and rhythm, lung sounds clear.  Lab Tests/Imaging studies: I personally interpreted labs/imaging and the pertinent results include:  respiratory panel positive for influenza A.   Medications: I ordered medication including Tylenol for fever.  I have reviewed the patients home medicines and have made adjustments as needed.   Disposition: After consideration of the diagnostic  results and the patients response to treatment, I feel that emergency department workup does not suggest an emergent condition requiring admission or immediate intervention beyond what has been performed at this time. Patient with symptoms consistent with influenza.  Vitals are stable, low-grade fever.  No signs of dehydration, tolerating PO's.  Lungs are clear.   The plan is: Patient will be discharged with instructions to orally hydrate, rest, and use over-the-counter medications such as anti-inflammatories such as ibuprofen and Tylenol for fever.  Prescribed Tessalon Perles, and patient requested refill of albuterol inhaler the patient is safe for discharge and has been instructed to return immediately for worsening symptoms, change in symptoms or any other concerns.  Final Clinical Impression(s) / ED Diagnoses Final diagnoses:  Influenza A  Viral URI with cough    Rx / DC Orders  ED Discharge Orders          Ordered    albuterol (VENTOLIN HFA) 108 (90 Base) MCG/ACT inhaler  Every 6 hours PRN        10/03/23 1619    benzonatate (TESSALON) 100 MG capsule  Every 8 hours        10/03/23 1619           Portions of this report may have been transcribed using voice recognition software. Every effort was made to ensure accuracy; however, inadvertent computerized transcription errors may be present.    Jeanella Flattery 10/03/23 1622    Terrilee Files, MD 10/04/23 [phone]

## 2023-10-03 NOTE — Discharge Instructions (Addendum)
 You were seen in the emergency department today for cough, nasal congestion, fever.  As we discussed your influenza A test is positive.  Symptoms can last for up to a week.    Treatment is directed at relieving symptoms, you can use the following: Increased fluid intake (sports drinks offer valuable electrolytes, sugars, and fluids) Breathing heated mist or steam (vaporizer or shower).  Eating chicken soup or other clear broths, and maintaining good nutrition.  Getting plenty of rest.  Using gargles or lozenges for comfort  Return to work when your temperature has returned to normal.  Gargle warm salt water and spit it out for sore throat.  Using cough syrups/decongestants like robitussin (dextromethorphan), mucinex (guaifenesin)  You may use up to 800 mg ibuprofen every 6 hours or 1000 mg of Tylenol every 6 hours.  You may choose to alternate between the 2.  This would be most effective.  Do not exceed 4000 mg of Tylenol within 24 hours.  Do not exceed 3200 mg ibuprofen within 24 hours.  Continue to monitor how you are doing, and return to the emergency department for new or worsening symptoms such as chest pain, difficulty breathing not related to coughing, fever despite medication, or persistent vomiting or diarrhea.  It has been a pleasure taking care of you today and I hope you begin to feel better soon!

## 2023-10-03 NOTE — ED Triage Notes (Signed)
 Cough x 1 day   rt sided throat pain

## 2023-10-04 ENCOUNTER — Ambulatory Visit: Payer: Self-pay | Admitting: Family Medicine

## 2023-10-04 NOTE — Telephone Encounter (Signed)
 Chief Complaint: Influenza Symptoms: Chills, body aches, cough, chest pain with cough, runny nose, yellow phlegm, difficulty breathing at times Frequency: Constant Pertinent Negatives: Patient denies fever at this time, sore throat, earache Disposition: [] ED /[] Urgent Care (no appt availability in office) / [x] Appointment(In office/virtual)/ []  [facility]/ [] Home Care/ [] Refused Recommended Disposition /[] [place]/ []  Follow-up with PCP Additional Notes: Pt was diagnosed with the flu yesterday at the ED. Pt states she started feeling bad last week. Pt states she feels like her cough is getting worse. Pt scheduled for an appointment tomorrow morning. This RN educated pt on home care, new-worsening symptoms, when to call back/seek emergent care. Pt verbalized understanding and agrees to plan.     Copied from CRM [id number]. Topic: Clinical - Red Word Triage >> Oct 04, 2023  2:42 PM Shelbie Proctor wrote: Red Word that prompted transfer to Nurse Triage: Patient [phone] was seen at the ER yesterday and diagnosis with flu and was prescribed an inhaler and cough medicine. Patient had a fever 101, nasal and chest congestion, coughing and blowing mucous is yellow started now, chest pain from coughing.   [facility] #[id number] [place], [place] - [address] AT [facility] [address] [address] Phone: [phone] Fax: [phone] Reason for Disposition  SEVERE coughing spells (e.g., whooping sound after coughing, vomiting after coughing)  Answer Assessment - Initial Assessment Questions 1. DIAGNOSIS CONFIRMATION: "When was the influenza diagnosed?" "By whom?" "Did you get a test for it?"     Yesterday at ED 2. MEDICINES: "Were you prescribed any medications for the influenza?"  (e.g., zanamivir [Relenza], oseltamavir [Tamiflu]).      Inhaler, cough medicine 3. SYMPTOMS: "What symptoms are you most concerned about?" (e.g., runny nose, stuffy nose, sore throat, cough, breathing  difficulty, fever)     Cough 4. COUGH: "How bad is the cough?"     Bad, coughing up yellow phlegm  Protocols used: Influenza (Flu) Follow-up Call-A-AH, Cough - Acute Productive-A-AH

## 2023-10-05 ENCOUNTER — Ambulatory Visit: Payer: BC Managed Care – PPO | Admitting: Physician Assistant

## 2023-10-09 ENCOUNTER — Encounter: Payer: Self-pay | Admitting: Family Medicine

## 2023-10-09 ENCOUNTER — Ambulatory Visit: Payer: BC Managed Care – PPO | Admitting: Family Medicine

## 2023-10-09 VITALS — BP 116/70 | HR 76 | Temp 98.7°F | Resp 20 | Ht 66.0 in | Wt 178.0 lb

## 2023-10-09 DIAGNOSIS — R21 Rash and other nonspecific skin eruption: Secondary | ICD-10-CM

## 2023-10-09 DIAGNOSIS — J101 Influenza due to other identified influenza virus with other respiratory manifestations: Secondary | ICD-10-CM

## 2023-10-09 MED ORDER — TRIAMCINOLONE ACETONIDE 0.1 % EX CREA: 1.0000 | TOPICAL_CREAM | Freq: Two times a day (BID) | CUTANEOUS | 0 refills | Status: DC

## 2023-10-09 MED ORDER — TRIAMCINOLONE ACETONIDE 0.025 % EX CREA: 1.0000 | TOPICAL_CREAM | Freq: Two times a day (BID) | CUTANEOUS | 0 refills | Status: DC

## 2023-10-09 NOTE — Patient Instructions (Signed)
 Continue to push fluids, practice good hand hygiene, and cover your mouth if you cough.  If you start having fevers, shaking or shortness of breath, seek immediate care.  OK to take Tylenol 1000 mg (2 extra strength tabs) or 975 mg (3 regular strength tabs) every 6 hours as needed.  Consider Mucinex. Consider an air humidifier.   Let us know if you need anything.

## 2023-10-09 NOTE — Progress Notes (Signed)
 Chief Complaint  Patient presents with   Acute Visit    Patient presents today for follow-up on a cough    April Ho here for URI complaints.  Duration: 8 days  Associated symptoms: sinus congestion, rhinorrhea, and coughing Denies: sinus pain, itchy watery eyes, ear pain, ear drainage, sore throat, wheezing, shortness of breath, myalgia, and fevers Many of s/s's improved Treatment to date: Cough medicine, Tylenol Sick contacts: No Tested + for flu A in ER.   Past Medical History:  Diagnosis Date   Genital warts    GERD (gastroesophageal reflux disease)    Psoriasis     Objective BP 116/70   Pulse 76   Temp 98.7 F (37.1 C)   Resp 20   Ht 5\' 6"  (1.676 m)   Wt 178 lb (80.7 kg)   SpO2 97%   BMI 28.73 kg/m  General: Awake, alert, appears stated age HEENT: AT, [place], ears patent b/l and TM's neg, nares patent w/o discharge, pharynx pink and without exudates, MMM, no sinus ttp b/l Neck: No masses or asymmetry Heart: RRR Lungs: CTAB, no accessory muscle use Psych: Age appropriate judgment and insight, normal mood and affect  Influenza A  Continue to push fluids, practice good hand hygiene, cover mouth when coughing. Seems to be improving. Tylenol prn.  FMLA- 10/03/23- 10/09/23, go back on 10/10/23. She will have forms sent here.  F/u prn. If starting to experience fevers, shaking, or shortness of breath, seek immediate care. Pt voiced understanding and agreement to the plan.  Jilda Roche Barn[place], DO 10/09/23 4:34 PM

## 2023-10-13 ENCOUNTER — Other Ambulatory Visit: Payer: Self-pay | Admitting: Family Medicine

## 2023-10-14 ENCOUNTER — Encounter (HOSPITAL_BASED_OUTPATIENT_CLINIC_OR_DEPARTMENT_OTHER): Payer: Self-pay | Admitting: Emergency Medicine

## 2023-10-14 ENCOUNTER — Other Ambulatory Visit: Payer: Self-pay

## 2023-10-14 DIAGNOSIS — W228XXA Striking against or struck by other objects, initial encounter: Secondary | ICD-10-CM | POA: Insufficient documentation

## 2023-10-14 DIAGNOSIS — Y990 Civilian activity done for income or pay: Secondary | ICD-10-CM | POA: Diagnosis not present

## 2023-10-14 DIAGNOSIS — M25572 Pain in left ankle and joints of left foot: Secondary | ICD-10-CM | POA: Insufficient documentation

## 2023-10-14 NOTE — ED Triage Notes (Signed)
 Pt via POV c/o left posterior ankle pain after hitting herself in the ankle with a pallet jack at work. Pt states this is not WC claim. Swelling noted to ankle, pain rated 7/10 when ambulating.

## 2023-10-15 ENCOUNTER — Emergency Department (HOSPITAL_BASED_OUTPATIENT_CLINIC_OR_DEPARTMENT_OTHER)
Admission: EM | Admit: 2023-10-15 | Discharge: 2023-10-15 | Disposition: A | Discharge status: Home / Self Care | Attending: Emergency Medicine | Admitting: Emergency Medicine

## 2023-10-15 ENCOUNTER — Emergency Department (HOSPITAL_BASED_OUTPATIENT_CLINIC_OR_DEPARTMENT_OTHER)

## 2023-10-15 DIAGNOSIS — M25572 Pain in left ankle and joints of left foot: Secondary | ICD-10-CM

## 2023-10-15 MED ORDER — KETOROLAC TROMETHAMINE 60 MG/2ML IM SOLN: 30.0000 mg | Freq: Once | INTRAMUSCULAR | Status: DC

## 2023-10-15 NOTE — ED Provider Notes (Signed)
 [place] [facility] Provider Note   CSN: [id number] Arrival date & time: 10/14/23  2326     History  Chief Complaint  Patient presents with   Ankle Pain    April Ho is a 48 y.o. female.   Ankle Pain    48 year old female presenting to the emerged part with left ankle pain after hitting herself with a pallet jack at work.  She endorses pain and swelling to the ankle.  She has been ambulatory and has been able to bear weight on the ankle all day.  She also sustained a small abrasion to the posterior aspect of the ankle, hemostatic.  Her Tdap is up-to-date.  She denies any other injuries or complaints.  Home Medications Prior to Admission medications   Medication Sig Start Date End Date Taking? Authorizing Provider  albuterol (VENTOLIN HFA) 108 (90 Base) MCG/ACT inhaler Inhale 1-2 puffs into the lungs every 6 (six) hours as needed for wheezing or shortness of breath. 10/03/23   Roemhildt, Lorin T, PA-C  alum & mag hydroxide-simeth (MAALOX MAX) 400-400-40 MG/5ML suspension Take 10 mLs by mouth every 6 (six) hours as needed for indigestion. 04/16/23   Henderly, Britni A, PA-C  Emollient (CETAPHIL) cream Apply topically as needed. 10/01/22   Curatolo, Adam, DO  EPINEPHrine 0.3 mg/0.3 mL IJ SOAJ injection Inject 0.3 mLs (0.3 mg total) into the muscle as needed for anaphylaxis. 01/29/19   Sharlene Dory, DO  fluticasone (FLONASE) 50 MCG/ACT nasal spray Place 2 sprays into both nostrils daily. 08/22/22   Sharlene Dory, DO  levocetirizine (XYZAL) 5 MG tablet TAKE 1 TABLET(5 MG) BY MOUTH EVERY EVENING 08/22/22   Wendling, Jilda Roche, DO  mirtazapine (REMERON) 7.5 MG tablet Take 1 tablet (7.5 mg total) by mouth at bedtime. 05/10/23   Sharlene Dory, DO  pantoprazole (PROTONIX) 20 MG tablet Take 1 tablet (20 mg total) by mouth daily. 04/19/23   Sharlene Dory, DO  polyethylene glycol (MIRALAX) 17 g packet Take 17 g by mouth  daily. 06/29/22   Sharlene Dory, DO  triamcinolone (KENALOG) 0.025 % cream Apply 1 Application topically 2 (two) times daily. This is for use on your face only. 10/09/23   Sharlene Dory, DO  triamcinolone cream (KENALOG) 0.1 % Apply 1 Application topically 2 (two) times daily. 10/09/23   Sharlene Dory, DO  valACYclovir (VALTREX) 500 MG tablet Take 1 tablet (500 mg total) by mouth daily. 11/02/22   Sharlene Dory, DO      Allergies    Cat dander, Dog epithelium, and Dust mite extract    Review of Systems   Review of Systems  All other systems reviewed and are negative.   Physical Exam Updated Vital Signs BP 125/83 (BP Location: Left Arm)   Pulse 61   Temp 98.1 F (36.7 C) (Oral)   Resp 16   Ht 5\' 6"  (1.676 m)   Wt 81.4 kg   SpO2 100%   BMI 28.96 kg/m  Physical Exam Vitals and nursing note reviewed.  Constitutional:      General: She is not in acute distress. HENT:     Head: Normocephalic and atraumatic.  Eyes:     Conjunctiva/sclera: Conjunctivae normal.     Pupils: Pupils are equal, round, and reactive to light.  Cardiovascular:     Rate and Rhythm: Normal rate and regular rhythm.  Pulmonary:     Effort: Pulmonary effort is normal. No respiratory distress.  Abdominal:     General: There is no distension.     Tenderness: There is no guarding.  Musculoskeletal:        General: Swelling and tenderness present. No deformity.     Cervical back: Neck supple.  Skin:    Findings: No lesion or rash.     Comments: Abrasion to posterior ankle, hemostatic  Neurological:     General: No focal deficit present.     Mental Status: She is alert. Mental status is at baseline.     ED Results / Procedures / Treatments   Labs (all labs ordered are listed, but only abnormal results are displayed) Labs Reviewed - No data to display  EKG None  Radiology DG Ankle Complete Left Result Date: 10/15/2023 CLINICAL DATA:  Pain EXAM: LEFT ANKLE  COMPLETE - 3+ VIEW COMPARISON:  None Available. FINDINGS: There is no evidence of fracture, dislocation, or joint effusion. There is no evidence of arthropathy or other focal bone abnormality. There is soft tissue swelling surrounding the ankle. IMPRESSION: 1. No acute fracture or dislocation. 2. Soft tissue swelling surrounding the ankle. Electronically Signed   By: Darliss Cheney M.D.   On: 10/15/2023 00:46    Procedures Procedures    Medications Ordered in ED Medications  ketorolac (TORADOL) injection 30 mg (has no administration in time range)    ED Course/ Medical Decision Making/ A&P                                 Medical Decision Making Amount and/or Complexity of Data Reviewed Radiology: ordered.  Risk Prescription drug management.    48 year old female presenting to the emerged part with left ankle pain after hitting herself with a pallet jack at work.  She endorses pain and swelling to the ankle.  She has been ambulatory and has been able to bear weight on the ankle all day.  She also sustained a small abrasion to the posterior aspect of the ankle, hemostatic.  Her Tdap is up-to-date.  She denies any other injuries or complaints.  On arrival, the patient was vitally stable.  X-ray imaging was performed revealed no acute fracture or dislocation with soft tissue swelling around the ankle.  The patient is able to bear weight.  Will place the patient in Ace wrap, crutches provided as well.  Toradol provided for pain control.  Patient overall ambulatory, Tdap up-to-date, stable for discharge, recommended Tylenol and ibuprofen for pain control and outpatient PCP follow-up.   Final Clinical Impression(s) / ED Diagnoses Final diagnoses:  Acute left ankle pain    Rx / DC Orders ED Discharge Orders     None         Ernie Avena, MD 10/15/23 [phone]

## 2023-10-15 NOTE — Discharge Instructions (Addendum)
 Your x-ray imaging was negative for evidence of acute fracture.  Recommend Tylenol and ibuprofen for pain control.  Can wear an Ace wrap for comfort and crutches have been provided.  Follow-up with your PCP to ensure resolution.

## 2023-10-19 ENCOUNTER — Telehealth: Payer: Self-pay

## 2023-10-19 NOTE — Telephone Encounter (Signed)
 Copied from CRM [id number]. Topic: General - Other >> Oct 19, 2023 11:07 AM Almira Coaster wrote: Reason for CRM: Patient is returning a call she received from Maximino Sarin. Patient is calling with the information that was requested to fax over FML paper work to [employer]. Fax number is [phone] and a phone number for [employer] is [phone].

## 2023-10-19 NOTE — Telephone Encounter (Signed)
 Copied from CRM [id number]. Topic: General - Other >> Oct 19, 2023  9:38 AM Kathryne Eriksson wrote: Reason for CRM: Urgent Request >> Oct 19, 2023  9:41 AM Kathryne Eriksson wrote: Patient states she needs for Sharlene Dory, DO to send documentation back to her employer, she's on the verge of losing her job if this information isn't sent back. Patient states this needs to be faxed over by March 18th, that's the deadline given. Any further information, please give patient a call at [phone]

## 2023-10-19 NOTE — Telephone Encounter (Signed)
 Spoke with pt made her aware forms were faxed on 10/13/2023 .Marland Kitchen  Forms refaxed

## 2023-10-20 ENCOUNTER — Ambulatory Visit: Payer: BC Managed Care – PPO

## 2023-10-20 DIAGNOSIS — E538 Deficiency of other specified B group vitamins: Secondary | ICD-10-CM

## 2023-10-20 MED ORDER — CYANOCOBALAMIN 1000 MCG/ML IJ SOLN
1000.0000 ug | Freq: Once | INTRAMUSCULAR | Status: AC
Start: 2023-10-20 — End: 2023-10-20
  Administered 2023-10-20: 1000 ug via INTRAMUSCULAR

## 2023-10-20 NOTE — Progress Notes (Signed)
 Pt here for monthly B12 injection per PCP  B12 given IM R deltoid, and pt tolerated injection well.  Next B12 injection scheduled for 11/21/2023.

## 2023-10-26 ENCOUNTER — Telehealth: Payer: Self-pay | Admitting: Family Medicine

## 2023-10-26 NOTE — Telephone Encounter (Signed)
 Pt came in office stating provider had filled out document for pt's job and that question 1 was missed out by provider, pt mentioned question was asking when Pt was out (pt states was out on February 25, 26 and 27 and that she returned back to work on February 28 (Friday)). Pt stated if provider can send information by fax [phone] or email: [email or web address] or could call at [phone] to give info on phone.  Please advise. Pt stated any question to call pt at [phone]. (Pt left a sheet with info on it -put at front office providers tray)

## 2023-10-27 NOTE — Telephone Encounter (Signed)
 Form is in folder with a sticky note on it so that corrections can be made to question number 1. Once completed, we can fax/email the form again.

## 2023-11-21 ENCOUNTER — Ambulatory Visit (INDEPENDENT_AMBULATORY_CARE_PROVIDER_SITE_OTHER): Admitting: Neurology

## 2023-11-21 DIAGNOSIS — E538 Deficiency of other specified B group vitamins: Secondary | ICD-10-CM | POA: Diagnosis not present

## 2023-11-21 MED ORDER — CYANOCOBALAMIN 1000 MCG/ML IJ SOLN
1000.0000 ug | Freq: Once | INTRAMUSCULAR | Status: AC
  Administered 2023-11-21: 1000 ug via INTRAMUSCULAR

## 2023-11-21 NOTE — Progress Notes (Signed)
 Patient is here for monthly vitamin B12 injection per orders from Dawna Etienne, DO:  07/19/23: "Please let pt know her numbers are much better. We can taper down to every other week for 2 weeks and then monthly shots."  Last injection: 10/20/2023. Denies gastrointestinal problems or dizziness.  B12 injection to right deltoid with no apparent complications.  Scheduled next injection in one month: 12/21/2023.

## 2023-12-21 ENCOUNTER — Ambulatory Visit

## 2023-12-21 DIAGNOSIS — E538 Deficiency of other specified B group vitamins: Secondary | ICD-10-CM | POA: Diagnosis not present

## 2023-12-21 MED ORDER — CYANOCOBALAMIN 1000 MCG/ML IJ SOLN
1000.0000 ug | Freq: Once | INTRAMUSCULAR | Status: AC
  Administered 2023-12-21: 1000 ug via INTRAMUSCULAR

## 2023-12-21 NOTE — Progress Notes (Signed)
 Pt here for monthly B12 injection per original order dated: 07/19/23: "Please let pt know her numbers are much better. We can taper down to every other week for 2 weeks and then monthly shots."   Last B12 injection:11/21/23  Last B12 level:  07/19/23 was 480  B12 given IM on right deltoid, and pt tolerated injection well.  Next B12 injection scheduled for: 01/19/24.

## 2024-01-19 ENCOUNTER — Ambulatory Visit (INDEPENDENT_AMBULATORY_CARE_PROVIDER_SITE_OTHER)

## 2024-01-19 DIAGNOSIS — E538 Deficiency of other specified B group vitamins: Secondary | ICD-10-CM | POA: Diagnosis not present

## 2024-01-19 MED ORDER — CYANOCOBALAMIN 1000 MCG/ML IJ SOLN
1000.0000 ug | Freq: Once | INTRAMUSCULAR | Status: AC
  Administered 2024-01-19: 1000 ug via INTRAMUSCULAR

## 2024-01-19 NOTE — Progress Notes (Signed)
 Pt here for monthly B12 injection per original order dated: 07/19/23, per Dr. Gwenette Lennox We can taper down to every other week for 2 weeks and then monthly shots.   Last B12 injection: 12/21/23  Last B12 level: 07/19/2023   B12 1000mcg given IM, right deltoid and pt tolerated injection well.  Next B12 injection scheduled for: 02/19/24

## 2024-02-19 ENCOUNTER — Ambulatory Visit (INDEPENDENT_AMBULATORY_CARE_PROVIDER_SITE_OTHER)

## 2024-02-19 DIAGNOSIS — E538 Deficiency of other specified B group vitamins: Secondary | ICD-10-CM

## 2024-02-19 MED ORDER — CYANOCOBALAMIN 1000 MCG/ML IJ SOLN
1000.0000 ug | Freq: Once | INTRAMUSCULAR | Status: AC
  Administered 2024-02-19: 1000 ug via INTRAMUSCULAR

## 2024-02-19 NOTE — Progress Notes (Signed)
 Pt here for monthly B12 injection per Wendling  Last B12 injection: 01/19/24  Last B12 level:  07/19/23  B12 1000mcg given IM, and pt tolerated injection well.  Next B12 injection scheduled for: 08/15

## 2024-03-08 ENCOUNTER — Other Ambulatory Visit: Payer: Self-pay | Admitting: Family Medicine

## 2024-03-08 DIAGNOSIS — R21 Rash and other nonspecific skin eruption: Secondary | ICD-10-CM

## 2024-03-08 NOTE — Telephone Encounter (Signed)
 Copied from CRM [id number]. Topic: Clinical - Medication Refill >> Mar 08, 2024  4:02 PM Paige D wrote: Medication:   triamcinolone  cream (KENALOG ) 0.1 %   triamcinolone  cream (KENALOG ) 0.1 %   Has the patient contacted their pharmacy? Yes (Agent: If no, request that the patient contact the pharmacy for the refill. If patient does not wish to contact the pharmacy document the reason why and proceed with request.) (Agent: If yes, when and what did the pharmacy advise?)  This is the patient's preferred pharmacy:  [id number], [place] - [address] AT [facility] [address] [place] [address] Phone: [phone] Fax: [phone]   Is this the correct pharmacy for this prescription? Yes If no, delete pharmacy and type the correct one.   Has the prescription been filled recently? No  Is the patient out of the medication? Yes  Has the patient been seen for an appointment in the last year OR does the patient have an upcoming appointment? Yes  Can we respond through MyChart? Yes  Agent: Please be advised that Rx refills may take up to 3 business days. We ask that you follow-up with your pharmacy.

## 2024-03-22 ENCOUNTER — Ambulatory Visit (INDEPENDENT_AMBULATORY_CARE_PROVIDER_SITE_OTHER)

## 2024-03-22 DIAGNOSIS — E538 Deficiency of other specified B group vitamins: Secondary | ICD-10-CM

## 2024-03-22 MED ORDER — CYANOCOBALAMIN 1000 MCG/ML IJ SOLN
1000.0000 ug | Freq: Once | INTRAMUSCULAR | Status: AC
  Administered 2024-03-22: 1000 ug via INTRAMUSCULAR

## 2024-03-22 NOTE — Progress Notes (Signed)
 Pt here for monthly B12 injection per original order dated:07/1123, per Dr. Frann We can taper down to every other week for 2 weeks and then monthly shots.   Last B12 injection:02/19/24  Last B12 level:  07/19/2023  B12 1000mcg given IM, right deltoid and pt tolerated injection well.  Next B12 injection scheduled for: 04/26/2024

## 2024-04-05 ENCOUNTER — Other Ambulatory Visit: Payer: Self-pay | Admitting: Family Medicine

## 2024-04-05 NOTE — Telephone Encounter (Signed)
 Copied from CRM [id number]. Topic: Clinical - Medication Refill >> Apr 05, 2024 12:08 PM Martinique E wrote: Medication: triamcinolone  cream (KENALOG ) 0.1 %  Has the patient contacted their pharmacy? Yes (Agent: If no, request that the patient contact the pharmacy for the refill. If patient does not wish to contact the pharmacy document the reason why and proceed with request.) (Agent: If yes, when and what did the pharmacy advise?)  This is the patient's preferred pharmacy:  [facility] #[id number] [facility], [place] - [address] AT [facility] [address] [place] [address] Phone: [phone] Fax: [phone]   Is this the correct pharmacy for this prescription? Yes If no, delete pharmacy and type the correct one.   Has the prescription been filled recently? Yes  Is the patient out of the medication? Yes  Has the patient been seen for an appointment in the last year OR does the patient have an upcoming appointment? Yes  Can we respond through MyChart? No  Agent: Please be advised that Rx refills may take up to 3 business days. We ask that you follow-up with your pharmacy.

## 2024-04-26 ENCOUNTER — Ambulatory Visit (INDEPENDENT_AMBULATORY_CARE_PROVIDER_SITE_OTHER): Admitting: Neurology

## 2024-04-26 DIAGNOSIS — E538 Deficiency of other specified B group vitamins: Secondary | ICD-10-CM

## 2024-04-26 MED ORDER — CYANOCOBALAMIN 1000 MCG/ML IJ SOLN
1000.0000 ug | Freq: Once | INTRAMUSCULAR | Status: AC
  Administered 2024-04-26: 1000 ug via INTRAMUSCULAR

## 2024-04-26 NOTE — Progress Notes (Signed)
 Patient is here for a vitamin B12 injection per orders from Dr. Frann:  07/19/2023: Please let pt know her numbers are much better. We can taper down to every other week for 2 weeks and then monthly shots. Ty. Please let pt know she has an inability to absorb Vit B12 so will require injections moving forward as she has been receiving.   Last Injection: 03/22/2024 Denies gastrointestinal problems or dizziness.  B12 injection to right deltoid with no apparent complications.  Scheduled next injection in one month: 05/31/2024.

## 2024-05-10 ENCOUNTER — Other Ambulatory Visit: Payer: Self-pay | Admitting: Family Medicine

## 2024-05-10 DIAGNOSIS — R21 Rash and other nonspecific skin eruption: Secondary | ICD-10-CM

## 2024-05-10 MED ORDER — TRIAMCINOLONE ACETONIDE 0.1 % EX CREA: TOPICAL_CREAM | Freq: Two times a day (BID) | CUTANEOUS | 0 refills | Status: DC

## 2024-05-10 MED ORDER — TRIAMCINOLONE ACETONIDE 0.025 % EX CREA: TOPICAL_CREAM | Freq: Two times a day (BID) | CUTANEOUS | 0 refills | Status: DC

## 2024-05-10 NOTE — Telephone Encounter (Signed)
 Copied from CRM #[id number]. Topic: Clinical - Medication Refill >> May 10, 2024  1:27 PM Suzen RAMAN wrote: Medication: triamcinolone  (KENALOG ) 0.025 % cream triamcinolone  cream (KENALOG ) 0.1 %    Has the patient contacted their pharmacy? Yes   This is the patient's preferred pharmacy:  [facility] #[id number] [facility], [place] - [address] AT [facility] [address] [place] [address] Phone: [phone] Fax: [phone]   Is this the correct pharmacy for this prescription? Yes If no, delete pharmacy and type the correct one.   Has the prescription been filled recently? Yes  Is the patient out of the medication? Yes  Has the patient been seen for an appointment in the last year OR does the patient have an upcoming appointment? Yes  Can we respond through MyChart? No  Agent: Please be advised that Rx refills may take up to 3 business days. We ask that you follow-up with your pharmacy.

## 2024-05-13 ENCOUNTER — Other Ambulatory Visit: Payer: Self-pay

## 2024-05-13 DIAGNOSIS — F411 Generalized anxiety disorder: Secondary | ICD-10-CM

## 2024-05-13 MED ORDER — MIRTAZAPINE 7.5 MG PO TABS: 7.5000 mg | ORAL_TABLET | Freq: Every day | ORAL | 2 refills | Status: AC

## 2024-05-20 ENCOUNTER — Telehealth: Payer: Self-pay

## 2024-05-20 NOTE — Telephone Encounter (Signed)
 Called pt Lvm for her to call schedule Thyroid  US  And left number to reach out for appt.

## 2024-05-31 ENCOUNTER — Ambulatory Visit (INDEPENDENT_AMBULATORY_CARE_PROVIDER_SITE_OTHER)

## 2024-05-31 DIAGNOSIS — E538 Deficiency of other specified B group vitamins: Secondary | ICD-10-CM | POA: Diagnosis not present

## 2024-05-31 MED ORDER — CYANOCOBALAMIN 1000 MCG/ML IJ SOLN
1000.0000 ug | Freq: Once | INTRAMUSCULAR | Status: AC
  Administered 2024-05-31: 1000 ug via INTRAMUSCULAR

## 2024-05-31 NOTE — Progress Notes (Signed)
 Pt here for monthly B12 injection per PCP  Last B12 injection: 04/26/2024  B12 1000mcg given IM L deltoid, and pt tolerated injection well.  Next B12 injection scheduled for: 06/28/2024

## 2024-06-18 ENCOUNTER — Emergency Department (HOSPITAL_BASED_OUTPATIENT_CLINIC_OR_DEPARTMENT_OTHER)
Admission: EM | Admit: 2024-06-18 | Discharge: 2024-06-18 | Disposition: A | Discharge status: Home / Self Care | Attending: Emergency Medicine | Admitting: Emergency Medicine

## 2024-06-18 ENCOUNTER — Other Ambulatory Visit: Payer: Self-pay

## 2024-06-18 ENCOUNTER — Encounter (HOSPITAL_BASED_OUTPATIENT_CLINIC_OR_DEPARTMENT_OTHER): Payer: Self-pay

## 2024-06-18 DIAGNOSIS — R0982 Postnasal drip: Secondary | ICD-10-CM | POA: Diagnosis not present

## 2024-06-18 DIAGNOSIS — J029 Acute pharyngitis, unspecified: Secondary | ICD-10-CM | POA: Diagnosis present

## 2024-06-18 DIAGNOSIS — J309 Allergic rhinitis, unspecified: Secondary | ICD-10-CM

## 2024-06-18 LAB — RESP PANEL BY RT-PCR (RSV, FLU A&B, COVID)  RVPGX2
Influenza A by PCR: NEGATIVE
Influenza B by PCR: NEGATIVE
Resp Syncytial Virus by PCR: NEGATIVE
SARS Coronavirus 2 by RT PCR: NEGATIVE

## 2024-06-18 LAB — GROUP A STREP BY PCR: Group A Strep by PCR: NOT DETECTED

## 2024-06-18 MED ORDER — CETIRIZINE HCL 10 MG PO TABS: 10.0000 mg | ORAL_TABLET | Freq: Every day | ORAL | 0 refills | Status: AC

## 2024-06-18 MED ORDER — FLUTICASONE PROPIONATE 50 MCG/ACT NA SUSP: 2.0000 | Freq: Every day | NASAL | 6 refills | Status: AC

## 2024-06-18 NOTE — ED Triage Notes (Signed)
 Sore throat started Sunday OTC meds are not helping

## 2024-06-18 NOTE — ED Provider Notes (Signed)
 [place] EM[facility]ENC[facility] DEPARTMENT AT [facility] Provider Note   CSN: [id number] Arrival date & time: 06/18/24  2203     Patient presents with: Sore Throat   April Ho is a 48 y.o. female.    Sore Throat   Patient is a 48 year old female with past medical history significant for reflux  She presents emergency room today with complaints of sore throat since Sunday.  Has taken no medications today but has tried some over-the-counter medications.  She endorses sinus congestion as well no fever or chill no facial pain or tenderness.  No nausea vomiting chest pain or difficulty breathing.  No coughing or hemoptysis         Prior to Admission medications   Medication Sig Start Date End Date Taking? Authorizing Provider  cetirizine  (ZYRTEC  ALLERGY) 10 MG tablet Take 1 tablet (10 mg total) by mouth daily. 06/18/24  Yes Melondy Blanchard, Hamp RAMAN, PA  albuterol  (VENTOLIN  HFA) 108 (90 Base) MCG/ACT inhaler Inhale 1-2 puffs into the lungs every 6 (six) hours as needed for wheezing or shortness of breath. 10/03/23   Roemhildt, Lorin T, PA-C  alum & mag hydroxide-simeth (MAALOX MAX) 400-400-40 MG/5ML suspension Take 10 mLs by mouth every 6 (six) hours as needed for indigestion. 04/16/23   Henderly, Britni A, PA-C  Emollient (CETAPHIL) cream Apply topically as needed. 10/01/22   Curatolo, Adam, DO  EPINEPHrine  0.3 mg/0.3 mL IJ SOAJ injection Inject 0.3 mLs (0.3 mg total) into the muscle as needed for anaphylaxis. 01/29/19   Frann Mabel Mt, DO  fluticasone  (FLONASE ) 50 MCG/ACT nasal spray Place 2 sprays into both nostrils daily. 06/18/24   Neldon Hamp RAMAN, PA  levocetirizine (XYZAL ) 5 MG tablet TAKE 1 TABLET(5 MG) BY MOUTH EVERY EVENING 08/22/22   Wendling, Mabel Mt, DO  mirtazapine  (REMERON ) 7.5 MG tablet Take 1 tablet (7.5 mg total) by mouth at bedtime. 05/13/24   Frann Mabel Mt, DO  pantoprazole  (PROTONIX ) 20 MG tablet Take 1 tablet (20 mg total) by mouth daily. 04/19/23    Frann Mabel Mt, DO  polyethylene glycol (MIRALAX ) 17 g packet Take 17 g by mouth daily. 06/29/22   Frann Mabel Mt, DO  triamcinolone  (KENALOG ) 0.025 % cream Apply topically 2 (two) times daily. 05/10/24   Frann Mabel Mt, DO  triamcinolone  cream (KENALOG ) 0.1 % Apply topically 2 (two) times daily. 05/10/24   Frann Mabel Mt, DO  valACYclovir  (VALTREX ) 500 MG tablet Take 1 tablet (500 mg total) by mouth daily. 11/02/22   Frann Mabel Mt, DO    Allergies: Cat dander, Dog epithelium, and Dust mite extract    Review of Systems  Updated Vital Signs BP 125/86 (BP Location: Right Arm)   Pulse 96   Temp 98.6 F (37 C) (Oral)   Resp 20   Ht 5' 6 (1.676 m)   Wt 86.2 kg   SpO2 100%   BMI 30.67 kg/m   Physical Exam Vitals and nursing note reviewed.  Constitutional:      General: She is not in acute distress. HENT:     Head: Normocephalic and atraumatic.     Nose: Congestion present.     Mouth/Throat:     Mouth: Mucous membranes are moist.     Comments: Some posterior pharyngeal erythema, tonsils normal-sized, symmetric, uvula midline, normal phonation, cobblestoning posterior pharynx clear PND Eyes:     General: No scleral icterus. Cardiovascular:     Rate and Rhythm: Normal rate and regular rhythm.     Pulses: Normal  pulses.     Heart sounds: Normal heart sounds.  Pulmonary:     Effort: Pulmonary effort is normal. No respiratory distress.     Breath sounds: No wheezing.  Abdominal:     Palpations: Abdomen is soft.     Tenderness: There is no abdominal tenderness.  Musculoskeletal:     Cervical back: Normal range of motion.     Right lower leg: No edema.     Left lower leg: No edema.  Skin:    General: Skin is warm and dry.     Capillary Refill: Capillary refill takes less than 2 seconds.  Neurological:     Mental Status: She is alert. Mental status is at baseline.  Psychiatric:        Mood and Affect: Mood normal.        Behavior:  Behavior normal.     (all labs ordered are listed, but only abnormal results are displayed) Labs Reviewed  RESP PANEL BY RT-PCR (RSV, FLU A&B, COVID)  [id number]  GROUP A STREP BY PCR    EKG: None  Radiology: No results found.   Procedures   Medications Ordered in the ED - No data to display                                  Medical Decision Making Risk OTC drugs.   Patient is a 48 year old female with past medical history significant for reflux  She presents emergency room today with complaints of sore throat since Sunday.  Has taken no medications today but has tried some over-the-counter medications.  She endorses sinus congestion as well no fever or chill no facial pain or tenderness.  No nausea vomiting chest pain or difficulty breathing.  No coughing or hemoptysis  Strep test negative, COVID RSV flu negative  Patient is well-appearing will prescribe fluticasone and Zyrtec and recommend hydration rest and follow-up with primary care.  Return precautions emergency room provided.  I suspect patient has a viral illness.    Final diagnoses:  Post-nasal drainage  Pharyngitis, unspecified etiology    ED Discharge Orders          Ordered    fluticasone (FLONASE) 50 MCG/ACT nasal spray  Daily        06/18/24 2258    cetirizine (ZYRTEC ALLERGY) 10 MG tablet  Daily        11 /11/25 2258               Neldon Inoue Malone, [place] 06/19/24 2058    Darra Fonda MATSU, MD 06/25/24 1114

## 2024-06-18 NOTE — Discharge Instructions (Signed)
 Use fluticasone  2 sprays in each nostril twice daily, gargle warm salt water, you can use a Nettie pot for nasal cleansing, start omeprazole (or re-start protonix ).

## 2024-06-20 ENCOUNTER — Emergency Department (HOSPITAL_BASED_OUTPATIENT_CLINIC_OR_DEPARTMENT_OTHER)

## 2024-06-20 ENCOUNTER — Emergency Department (HOSPITAL_BASED_OUTPATIENT_CLINIC_OR_DEPARTMENT_OTHER)
Admission: EM | Admit: 2024-06-20 | Discharge: 2024-06-20 | Disposition: A | Discharge status: Home / Self Care | Attending: Emergency Medicine | Admitting: Emergency Medicine

## 2024-06-20 ENCOUNTER — Encounter (HOSPITAL_BASED_OUTPATIENT_CLINIC_OR_DEPARTMENT_OTHER): Payer: Self-pay | Admitting: Emergency Medicine

## 2024-06-20 DIAGNOSIS — R519 Headache, unspecified: Secondary | ICD-10-CM | POA: Diagnosis not present

## 2024-06-20 DIAGNOSIS — K226 Gastro-esophageal laceration-hemorrhage syndrome: Secondary | ICD-10-CM | POA: Insufficient documentation

## 2024-06-20 DIAGNOSIS — R112 Nausea with vomiting, unspecified: Secondary | ICD-10-CM | POA: Diagnosis present

## 2024-06-20 LAB — COMPREHENSIVE METABOLIC PANEL WITH GFR
ALT: 15 U/L (ref 0–44)
AST: 17 U/L (ref 15–41)
Albumin: 4.5 g/dL (ref 3.5–5.0)
Alkaline Phosphatase: 152 U/L — ABNORMAL HIGH (ref 38–126)
Anion gap: 11 (ref 5–15)
BUN: 8 mg/dL (ref 6–20)
CO2: 23 mmol/L (ref 22–32)
Calcium: 9.4 mg/dL (ref 8.9–10.3)
Chloride: 105 mmol/L (ref 98–111)
Creatinine, Ser: 0.67 mg/dL (ref 0.44–1.00)
GFR, Estimated: >60 mL/min (ref >60)
Glucose, Bld: 114 mg/dL — ABNORMAL HIGH (ref 70–99)
Potassium: 3.9 mmol/L (ref 3.5–5.1)
Sodium: 139 mmol/L (ref 135–145)
Total Bilirubin: 0.3 mg/dL (ref 0.0–1.2)
Total Protein: 7.5 g/dL (ref 6.5–8.1)

## 2024-06-20 LAB — CBC WITH DIFFERENTIAL/PLATELET
Abs Immature Granulocytes: 0.01 K/uL (ref 0.00–0.07)
Basophils Absolute: 0 K/uL (ref 0.0–0.1)
Basophils Relative: 0 %
Eosinophils Absolute: 0.2 K/uL (ref 0.0–0.5)
Eosinophils Relative: 3 %
HCT: 40.1 % (ref 36.0–46.0)
Hemoglobin: 13.1 g/dL (ref 12.0–15.0)
Immature Granulocytes: 0 %
Lymphocytes Relative: 28 %
Lymphs Abs: 1.4 K/uL (ref 0.7–4.0)
MCH: 30.8 pg (ref 26.0–34.0)
MCHC: 32.7 g/dL (ref 30.0–36.0)
MCV: 94.1 fL (ref 80.0–100.0)
Monocytes Absolute: 0.5 K/uL (ref 0.1–1.0)
Monocytes Relative: 10 %
Neutro Abs: 3 K/uL (ref 1.7–7.7)
Neutrophils Relative %: 59 %
Platelets: 250 K/uL (ref 150–400)
RBC: 4.26 MIL/uL (ref 3.87–5.11)
RDW: 12.8 % (ref 11.5–15.5)
WBC: 5.1 K/uL (ref 4.0–10.5)
nRBC: 0 % (ref 0.0–0.2)

## 2024-06-20 LAB — LIPASE, BLOOD: Lipase: 20 U/L (ref 11–51)

## 2024-06-20 LAB — HCG, SERUM, QUALITATIVE: Preg, Serum: NEGATIVE

## 2024-06-20 MED ORDER — ONDANSETRON HCL 4 MG/2ML IJ SOLN
4.0000 mg | Freq: Once | INTRAMUSCULAR | Status: AC
  Administered 2024-06-20: 4 mg via INTRAVENOUS
  Filled 2024-06-20: qty 2

## 2024-06-20 MED ORDER — METOCLOPRAMIDE HCL 5 MG/ML IJ SOLN
10.0000 mg | Freq: Once | INTRAMUSCULAR | Status: AC
  Administered 2024-06-20: 10 mg via INTRAVENOUS
  Filled 2024-06-20: qty 2

## 2024-06-20 MED ORDER — SODIUM CHLORIDE 0.9 % IV BOLUS
1000.0000 mL | Freq: Once | INTRAVENOUS | Status: AC
  Administered 2024-06-20: 1000 mL via INTRAVENOUS

## 2024-06-20 MED ORDER — ONDANSETRON 4 MG PO TBDP: ORAL_TABLET | ORAL | 0 refills | Status: DC

## 2024-06-20 NOTE — ED Provider Notes (Signed)
 [place] EM[facility] AT [facility] Provider Note   CSN: [id number] Arrival date & time: 06/20/24  0531     Patient presents with: Headache and Emesis   April Ho is a 48 y.o. female.   Presents to the emergency department feeling worse.  She was seen in the ED 2 days ago and felt to be having URI symptoms.  She reports that she has been using his medications but her headache has worsened.  She is experiencing global headache and nausea and vomiting.  Patient reports that she vomited this morning and there was red blood in it.       Prior to Admission medications   Medication Sig Start Date End Date Taking? Authorizing Provider  ondansetron  (ZOFRAN -ODT) 4 MG disintegrating tablet 4mg  ODT q4 hours prn nausea/vomit 06/20/24  Yes Tamala Manzer, Lonni PARAS, MD  albuterol  (VENTOLIN  HFA) 108 (90 Base) MCG/ACT inhaler Inhale 1-2 puffs into the lungs every 6 (six) hours as needed for wheezing or shortness of breath. 10/03/23   Roemhildt, Lorin T, PA-C  alum & mag hydroxide-simeth (MAALOX MAX) 400-400-40 MG/5ML suspension Take 10 mLs by mouth every 6 (six) hours as needed for indigestion. 04/16/23   Henderly, Britni A, PA-C  cetirizine  (ZYRTEC  ALLERGY) 10 MG tablet Take 1 tablet (10 mg total) by mouth daily. 06/18/24   Neldon Hamp RAMAN, PA  Emollient (CETAPHIL) cream Apply topically as needed. 10/01/22   Curatolo, Adam, DO  EPINEPHrine  0.3 mg/0.3 mL IJ SOAJ injection Inject 0.3 mLs (0.3 mg total) into the muscle as needed for anaphylaxis. 01/29/19   Frann Mabel Mt, DO  fluticasone  (FLONASE ) 50 MCG/ACT nasal spray Place 2 sprays into both nostrils daily. 06/18/24   Neldon Hamp RAMAN, PA  levocetirizine (XYZAL ) 5 MG tablet TAKE 1 TABLET(5 MG) BY MOUTH EVERY EVENING 08/22/22   Wendling, Mabel Mt, DO  mirtazapine  (REMERON ) 7.5 MG tablet Take 1 tablet (7.5 mg total) by mouth at bedtime. 05/13/24   Frann Mabel Mt, DO  pantoprazole  (PROTONIX ) 20 MG tablet Take 1  tablet (20 mg total) by mouth daily. 04/19/23   Frann Mabel Mt, DO  polyethylene glycol (MIRALAX ) 17 g packet Take 17 g by mouth daily. 06/29/22   Frann Mabel Mt, DO  triamcinolone  (KENALOG ) 0.025 % cream Apply topically 2 (two) times daily. 05/10/24   Frann Mabel Mt, DO  triamcinolone  cream (KENALOG ) 0.1 % Apply topically 2 (two) times daily. 05/10/24   Frann Mabel Mt, DO  valACYclovir  (VALTREX ) 500 MG tablet Take 1 tablet (500 mg total) by mouth daily. 11/02/22   Frann Mabel Mt, DO    Allergies: Cat dander, Dog epithelium, and Dust mite extract    Review of Systems  Updated Vital Signs BP (!) 133/93 (BP Location: Right Arm)   Pulse 92   Temp 99.7 F (37.6 C) (Oral)   Resp 18   Ht 5' 6 (1.676 m)   Wt 86 kg   SpO2 100%   BMI 30.60 kg/m   Physical Exam Vitals and nursing note reviewed.  Constitutional:      General: She is not in acute distress.    Appearance: She is well-developed.  HENT:     Head: Normocephalic and atraumatic.     Right Ear: Tympanic membrane and ear canal normal.     Left Ear: Tympanic membrane and ear canal normal.     Mouth/Throat:     Mouth: Mucous membranes are moist.  Eyes:     General: Vision grossly intact. Gaze aligned  appropriately.     Extraocular Movements: Extraocular movements intact.     Conjunctiva/sclera: Conjunctivae normal.  Cardiovascular:     Rate and Rhythm: Normal rate and regular rhythm.     Pulses: Normal pulses.     Heart sounds: Normal heart sounds, S1 normal and S2 normal. No murmur heard.    No friction rub. No gallop.  Pulmonary:     Effort: Pulmonary effort is normal. No respiratory distress.     Breath sounds: Normal breath sounds.  Abdominal:     General: Bowel sounds are normal.     Palpations: Abdomen is soft.     Tenderness: There is no abdominal tenderness. There is no guarding or rebound.     Hernia: No hernia is present.  Musculoskeletal:        General: No swelling.      Cervical back: Full passive range of motion without pain, normal range of motion and neck supple. No spinous process tenderness or muscular tenderness. Normal range of motion.     Right lower leg: No edema.     Left lower leg: No edema.  Skin:    General: Skin is warm and dry.     Capillary Refill: Capillary refill takes less than 2 seconds.     Findings: No ecchymosis, erythema, rash or wound.  Neurological:     General: No focal deficit present.     Mental Status: She is alert and oriented to person, place, and time.     GCS: GCS eye subscore is 4. GCS verbal subscore is 5. GCS motor subscore is 6.     Cranial Nerves: Cranial nerves 2-12 are intact.     Sensory: Sensation is intact.     Motor: Motor function is intact.     Coordination: Coordination is intact.  Psychiatric:        Attention and Perception: Attention normal.        Mood and Affect: Mood normal.        Speech: Speech normal.        Behavior: Behavior normal.     (all labs ordered are listed, but only abnormal results are displayed) Labs Reviewed  COMPREHENSIVE METABOLIC PANEL WITH GFR - Abnormal; Notable for the following components:      Result Value   Glucose, Bld 114 (*)    Alkaline Phosphatase 152 (*)    All other components within normal limits  CBC WITH DIFFERENTIAL/PLATELET  LIPASE, BLOOD  HCG, SERUM, QUALITATIVE    EKG: EKG Interpretation Date/Time:  Thursday June 20 2024 06:24:59 EST Ventricular Rate:  86 PR Interval:  159 QRS Duration:  83 QT Interval:  349 QTC Calculation: 418 R Axis:   80  Text Interpretation: Sinus rhythm Consider right atrial enlargement Abnormal R-wave progression, early transition Borderline T wave abnormalities Borderline ST elevation, lateral leads No significant change since last tracing Confirmed by Haze Lonni PARAS [phone]) on 06/20/2024 6:32:56 AM  Radiology: No results found.   Procedures   Medications Ordered in the ED  sodium chloride  0.9 %  bolus 1,000 mL (1,000 mLs Intravenous New Bag/Given 06/20/24 0616)  ondansetron  (ZOFRAN ) injection 4 mg (4 mg Intravenous Given 06/20/24 0616)  metoCLOPramide  (REGLAN ) injection 10 mg (10 mg Intravenous Given 06/20/24 0616)                                    Medical Decision Making Amount and/or Complexity of  Data Reviewed External Data Reviewed: labs and notes. Labs: ordered. Decision-making details documented in ED Course. Radiology: ordered. ECG/medicine tests: ordered and independent interpretation performed. Decision-making details documented in ED Course.  Risk Prescription drug management.   Differential diagnosis considered includes, but not limited to: URI; sepsis; gastritis; peptic ulcer disease; Mallory-Weiss tear  Presents to the emergency department with persistent symptoms she was seen in the ED 2 days ago and felt to have a viral process secondary to sore throat, nasal congestion.  She has had nausea and vomiting over the last 2 days.  This morning she vomited and there was red blood in it.  Patient does have a history of GERD.  She appears well, however.  Vital signs unremarkable.  Blood work is reassuring.  Normal hemoglobin of 13.1.  BUN and creatinine are normal, no elevation of BUN to suggest upper GI bleed.  No vomiting here in the ED.  Patient complaining of persistent and worsening headache.  Administered IV fluids, Zofran , Reglan .  Headache is improving.  No focal findings.  Patient will undergo CT head to further evaluate.  Will sign out to oncoming ER physician.  If CT head is unremarkable, continue outpatient management of likely viral process.     Final diagnoses:  Nausea and vomiting, unspecified vomiting type  Mallory-Weiss tear    ED Discharge Orders          Ordered    ondansetron  (ZOFRAN -ODT) 4 MG disintegrating tablet        06/20/24 0644               Haze Lonni PARAS, MD 06/20/24 [phone]

## 2024-06-20 NOTE — ED Triage Notes (Signed)
 Pt states headache and pressure, started about 0100. Then bloody emesis, sputum and nasal congestion.

## 2024-06-20 NOTE — Discharge Instructions (Signed)
 I think the blood was secondary to forceful vomiting.  Make sure you are taking your reflux medications, however because reflux can cause bleeding in the stomach.  If you continue to have bloody vomiting or persistently dark, black stools, return to the emergency department.

## 2024-06-28 ENCOUNTER — Ambulatory Visit

## 2024-06-28 DIAGNOSIS — E538 Deficiency of other specified B group vitamins: Secondary | ICD-10-CM

## 2024-06-28 MED ORDER — CYANOCOBALAMIN 1000 MCG/ML IJ SOLN
1000.0000 ug | Freq: Once | INTRAMUSCULAR | Status: AC
  Administered 2024-06-28: 1000 ug via INTRAMUSCULAR

## 2024-06-28 NOTE — Progress Notes (Signed)
 Pt here for monthly B12 injection per PCP   Last B12 injection: 04/26/2024   B12 1000mcg given IM L deltoid, and pt tolerated injection well.

## 2024-08-02 ENCOUNTER — Ambulatory Visit

## 2024-08-02 ENCOUNTER — Ambulatory Visit (INDEPENDENT_AMBULATORY_CARE_PROVIDER_SITE_OTHER): Admitting: Family Medicine

## 2024-08-02 ENCOUNTER — Encounter: Payer: Self-pay | Admitting: Family Medicine

## 2024-08-02 VITALS — BP 122/80 | HR 73 | Temp 98.1°F | Resp 16 | Ht 66.0 in | Wt 191.8 lb

## 2024-08-02 DIAGNOSIS — E538 Deficiency of other specified B group vitamins: Secondary | ICD-10-CM

## 2024-08-02 DIAGNOSIS — J020 Streptococcal pharyngitis: Secondary | ICD-10-CM | POA: Diagnosis not present

## 2024-08-02 LAB — POCT RAPID STREP A (OFFICE): Rapid Strep A Screen: POSITIVE — AB

## 2024-08-02 MED ORDER — FLUCONAZOLE 150 MG PO TABS: ORAL_TABLET | ORAL | 0 refills | Status: AC

## 2024-08-02 MED ORDER — AMOXICILLIN 500 MG PO CAPS: 1000.0000 mg | ORAL_CAPSULE | Freq: Every day | ORAL | 0 refills | Status: AC

## 2024-08-02 MED ORDER — CYANOCOBALAMIN 1000 MCG/ML IJ SOLN
1000.0000 ug | Freq: Once | INTRAMUSCULAR | Status: AC
  Administered 2024-08-02: 1000 ug via INTRAMUSCULAR

## 2024-08-02 NOTE — Patient Instructions (Signed)
 OK to take Tylenol  1000 mg (2 extra strength tabs) or 975 mg (3 regular strength tabs) every 6 hours as needed.  Ibuprofen  400-600 mg (2-3 over the counter strength tabs) every 6 hours as needed for pain.  Consider throat lozenges, salt water gargles and an air humidifier for symptomatic care.   Replace toothbrush/head after 24 hours of being on antibiotic.   Let us  know if you need anything.

## 2024-08-02 NOTE — Progress Notes (Signed)
 Pt here for monthly B12 injection per Frann Mabel Mt, DO   B12 1000mcg given IM right deltoid and pt tolerated injection well.  Next B12 injection scheduled for 08/30/2024

## 2024-08-02 NOTE — Progress Notes (Signed)
 Chief Complaint  Patient presents with   Acute Visit    Patient presents today for a cough and sore throat.    April Ho here for URI complaints.  Duration: 5 days  Associated symptoms: sinus congestion, sinus pain, rhinorrhea, sore throat, and coughing Denies: itchy watery eyes, ear pain, ear drainage, shortness of breath, myalgia, and fevers Treatment to date: INCS Sick contacts: Yes; coworkers  Past Medical History:  Diagnosis Date   Genital warts    GERD (gastroesophageal reflux disease)    Psoriasis     Objective BP 122/80   Pulse 73   Temp 98.1 F (36.7 C)   Resp 16   Ht 5' 6 (1.676 m)   Wt 191 lb 12.8 oz (87 kg)   SpO2 98%   BMI 30.96 kg/m  General: Awake, alert, appears stated age HEENT: AT, [place], ears patent b/l and TM's neg, nares patent w/o discharge, pharynx erythematous and without exudates, MMM, ttp over frontal sinuses b/l Neck: No masses or asymmetry; ttp over cerv LN's b/l Heart: RRR Lungs: CTAB, no accessory muscle use Psych: Age appropriate judgment and insight, normal mood and affect  Strep throat - Plan: POCT rapid strep A, fluconazole  (DIFLUCAN ) 150 MG tablet, amoxicillin  (AMOXIL ) 500 MG capsule  1/4 Centor, +rapid strep. 10 d of amox, Diflucan  prn. Continue to push fluids, practice good hand hygiene, cover mouth when coughing. Tylenol , NSAIDs, replace toothbrush after 24 hrs of abx.  F/u prn. If starting to experience fevers, shaking, or shortness of breath, seek immediate care. Pt voiced understanding and agreement to the plan.  [name], DO 08/02/2024 11:14 AM

## 2024-08-05 ENCOUNTER — Telehealth: Payer: Self-pay

## 2024-08-05 MED ORDER — ONDANSETRON 4 MG PO TBDP: 4.0000 mg | ORAL_TABLET | Freq: Three times a day (TID) | ORAL | 0 refills | Status: AC | PRN

## 2024-08-05 NOTE — Telephone Encounter (Signed)
 Copied from CRM #[id number]. Topic: Clinical - Medication Question >> Aug 05, 2024 11:27 AM Burnard DEL wrote: Reason for CRM: Patient was prescribed  amoxicillin  when seen on 08/02/2024. She is having some nausea from the antibiotic and now has a cough that she thinks is coming from the mucous that she is able to get to come up now ,that she would like to know if she could be prescribed something for?    [id number] [place], [place] - [address] AT [facility] [address]  Phone: [phone] Fax: [phone]

## 2024-08-05 NOTE — Telephone Encounter (Signed)
 Yes

## 2024-08-06 NOTE — Telephone Encounter (Signed)
 Sent pt per request about cough from what Dr.Wendling recommend.

## 2024-08-30 ENCOUNTER — Other Ambulatory Visit: Payer: Self-pay

## 2024-08-30 ENCOUNTER — Ambulatory Visit: Payer: Self-pay

## 2024-08-30 ENCOUNTER — Telehealth: Payer: Self-pay | Admitting: Family Medicine

## 2024-08-30 DIAGNOSIS — E538 Deficiency of other specified B group vitamins: Secondary | ICD-10-CM

## 2024-08-30 DIAGNOSIS — R21 Rash and other nonspecific skin eruption: Secondary | ICD-10-CM

## 2024-08-30 MED ORDER — TRIAMCINOLONE ACETONIDE 0.1 % EX CREA: TOPICAL_CREAM | Freq: Two times a day (BID) | CUTANEOUS | 0 refills | Status: DC

## 2024-08-30 MED ORDER — CETAPHIL MOISTURIZING EX CREA: TOPICAL_CREAM | CUTANEOUS | 0 refills | Status: AC | PRN

## 2024-08-30 MED ORDER — TRIAMCINOLONE ACETONIDE 0.025 % EX CREA: TOPICAL_CREAM | Freq: Two times a day (BID) | CUTANEOUS | 0 refills | Status: AC

## 2024-08-30 MED ORDER — TRIAMCINOLONE ACETONIDE 0.025 % EX CREA: TOPICAL_CREAM | Freq: Two times a day (BID) | CUTANEOUS | 0 refills | Status: DC

## 2024-08-30 MED ORDER — TRIAMCINOLONE ACETONIDE 0.1 % EX CREA: TOPICAL_CREAM | Freq: Two times a day (BID) | CUTANEOUS | 0 refills | Status: AC

## 2024-08-30 MED ORDER — CYANOCOBALAMIN 1000 MCG/ML IJ SOLN
1000.0000 ug | Freq: Once | INTRAMUSCULAR | Status: AC
  Administered 2024-08-30: 1000 ug via INTRAMUSCULAR

## 2024-08-30 NOTE — Telephone Encounter (Signed)
 Pt needs both creams called into the pharmacy on file on [address]

## 2024-08-30 NOTE — Progress Notes (Signed)
 Pt here for monthly B12 injection per PCP Last B12 injection: 08/02/2024  B12 1000mcg given IM R deltoid, and pt tolerated injection well.  Next B12 injection scheduled for:  10/04/2024

## 2024-08-30 NOTE — Telephone Encounter (Signed)
 Sent pt message letting her need both creams was sent.

## 2024-10-04 ENCOUNTER — Ambulatory Visit: Payer: Self-pay
